# Patient Record
Sex: Male | Born: 1940 | Race: White | Hispanic: No | Marital: Married | Smoking: Former smoker
Health system: Southern US, Community
[De-identification: ages and names within clinical notes are randomized; demographics above are authoritative.]

## PROBLEM LIST (undated history)

## (undated) DIAGNOSIS — K429 Umbilical hernia without obstruction or gangrene: Secondary | ICD-10-CM

## (undated) DIAGNOSIS — C61 Malignant neoplasm of prostate: Secondary | ICD-10-CM

## (undated) DIAGNOSIS — N2889 Other specified disorders of kidney and ureter: Secondary | ICD-10-CM

## (undated) DIAGNOSIS — I2510 Atherosclerotic heart disease of native coronary artery without angina pectoris: Secondary | ICD-10-CM

## (undated) DIAGNOSIS — Q283 Other malformations of cerebral vessels: Secondary | ICD-10-CM

## (undated) DIAGNOSIS — I214 Non-ST elevation (NSTEMI) myocardial infarction: Secondary | ICD-10-CM

## (undated) DIAGNOSIS — E785 Hyperlipidemia, unspecified: Secondary | ICD-10-CM

## (undated) DIAGNOSIS — I219 Acute myocardial infarction, unspecified: Secondary | ICD-10-CM

## (undated) DIAGNOSIS — I10 Essential (primary) hypertension: Secondary | ICD-10-CM

## (undated) DIAGNOSIS — R011 Cardiac murmur, unspecified: Secondary | ICD-10-CM

## (undated) DIAGNOSIS — I25110 Atherosclerotic heart disease of native coronary artery with unstable angina pectoris: Secondary | ICD-10-CM

## (undated) DIAGNOSIS — I252 Old myocardial infarction: Secondary | ICD-10-CM

## (undated) DIAGNOSIS — I493 Ventricular premature depolarization: Secondary | ICD-10-CM

## (undated) DIAGNOSIS — M1990 Unspecified osteoarthritis, unspecified site: Secondary | ICD-10-CM

## (undated) DIAGNOSIS — K219 Gastro-esophageal reflux disease without esophagitis: Secondary | ICD-10-CM

## (undated) DIAGNOSIS — R51 Headache: Secondary | ICD-10-CM

## (undated) HISTORY — PX: POLYPECTOMY: SHX149

## (undated) HISTORY — DX: Gastro-esophageal reflux disease without esophagitis: K21.9

## (undated) HISTORY — DX: Non-ST elevation (NSTEMI) myocardial infarction: I21.4

## (undated) HISTORY — DX: Essential (primary) hypertension: I10

## (undated) HISTORY — PX: PROSTATE SURGERY: SHX751

## (undated) HISTORY — DX: Cardiac murmur, unspecified: R01.1

## (undated) HISTORY — PX: JOINT REPLACEMENT: SHX530

## (undated) HISTORY — DX: Atherosclerotic heart disease of native coronary artery with unstable angina pectoris: I25.110

## (undated) HISTORY — DX: Old myocardial infarction: I25.2

## (undated) HISTORY — DX: Acute myocardial infarction, unspecified: I21.9

---

## 2001-01-22 HISTORY — PX: TOTAL HIP ARTHROPLASTY: SHX124

## 2001-01-23 ENCOUNTER — Encounter: Payer: Self-pay | Admitting: Orthopedic Surgery

## 2001-01-29 ENCOUNTER — Encounter: Payer: Self-pay | Admitting: Orthopedic Surgery

## 2001-01-29 ENCOUNTER — Inpatient Hospital Stay (HOSPITAL_COMMUNITY): Admission: RE | Admit: 2001-01-29 | Discharge: 2001-02-01 | Payer: Self-pay | Admitting: Orthopedic Surgery

## 2003-11-23 ENCOUNTER — Ambulatory Visit: Admission: RE | Admit: 2003-11-23 | Discharge: 2004-02-21 | Payer: Self-pay | Admitting: Radiation Oncology

## 2004-03-01 ENCOUNTER — Ambulatory Visit: Admission: RE | Admit: 2004-03-01 | Discharge: 2004-04-14 | Payer: Self-pay | Admitting: Radiation Oncology

## 2004-03-15 ENCOUNTER — Ambulatory Visit (HOSPITAL_BASED_OUTPATIENT_CLINIC_OR_DEPARTMENT_OTHER): Admission: RE | Admit: 2004-03-15 | Discharge: 2004-03-15 | Payer: Self-pay | Admitting: Dermatology

## 2004-03-15 ENCOUNTER — Ambulatory Visit (HOSPITAL_COMMUNITY): Admission: RE | Admit: 2004-03-15 | Discharge: 2004-03-15 | Payer: Self-pay | Admitting: Dermatology

## 2004-03-15 HISTORY — PX: OTHER SURGICAL HISTORY: SHX169

## 2007-04-14 DIAGNOSIS — I219 Acute myocardial infarction, unspecified: Secondary | ICD-10-CM | POA: Insufficient documentation

## 2007-04-14 HISTORY — DX: Acute myocardial infarction, unspecified: I21.9

## 2007-04-14 HISTORY — PX: CORONARY ANGIOPLASTY WITH STENT PLACEMENT: SHX49

## 2008-03-30 ENCOUNTER — Ambulatory Visit (HOSPITAL_COMMUNITY): Admission: RE | Admit: 2008-03-30 | Discharge: 2008-03-31 | Payer: Self-pay | Admitting: Orthopedic Surgery

## 2008-03-30 HISTORY — PX: SHOULDER OPEN ROTATOR CUFF REPAIR: SHX2407

## 2008-07-17 ENCOUNTER — Emergency Department (HOSPITAL_COMMUNITY): Admission: EM | Admit: 2008-07-17 | Discharge: 2008-07-17 | Payer: Self-pay | Admitting: Emergency Medicine

## 2008-07-22 HISTORY — PX: TOTAL HIP REVISION: SHX763

## 2008-08-10 ENCOUNTER — Inpatient Hospital Stay (HOSPITAL_COMMUNITY): Admission: RE | Admit: 2008-08-10 | Discharge: 2008-08-12 | Payer: Self-pay | Admitting: Orthopedic Surgery

## 2010-04-30 LAB — BASIC METABOLIC PANEL
BUN: 9 mg/dL (ref 6–23)
CO2: 29 mEq/L (ref 19–32)
Calcium: 8.2 mg/dL — ABNORMAL LOW (ref 8.4–10.5)
Calcium: 8.3 mg/dL — ABNORMAL LOW (ref 8.4–10.5)
Creatinine, Ser: 0.96 mg/dL (ref 0.4–1.5)
GFR calc Af Amer: >60 mL/min (ref >60)
GFR calc non Af Amer: >60 mL/min (ref >60)
Glucose, Bld: 101 mg/dL — ABNORMAL HIGH (ref 70–99)
Potassium: 3.9 mEq/L (ref 3.5–5.1)
Sodium: 136 mEq/L (ref 135–145)
Sodium: 140 mEq/L (ref 135–145)

## 2010-04-30 LAB — PROTIME-INR
INR: 1.1 (ref 0.00–1.49)
INR: 1.1 (ref 0.00–1.49)
INR: 1.2 (ref 0.00–1.49)
Prothrombin Time: 14.3 seconds (ref 11.6–15.2)
Prothrombin Time: 16 seconds — ABNORMAL HIGH (ref 11.6–15.2)

## 2010-04-30 LAB — URINALYSIS, ROUTINE W REFLEX MICROSCOPIC
Bilirubin Urine: NEGATIVE
Glucose, UA: NEGATIVE mg/dL
Hgb urine dipstick: NEGATIVE
Ketones, ur: NEGATIVE mg/dL
Protein, ur: NEGATIVE mg/dL
pH: 5.5 (ref 5.0–8.0)

## 2010-04-30 LAB — CBC
HCT: 37.6 % — ABNORMAL LOW (ref 39.0–52.0)
HCT: 45.1 % (ref 39.0–52.0)
Hemoglobin: 12.2 g/dL — ABNORMAL LOW (ref 13.0–17.0)
Hemoglobin: 13 g/dL (ref 13.0–17.0)
Hemoglobin: 15.4 g/dL (ref 13.0–17.0)
MCHC: 34.1 g/dL (ref 30.0–36.0)
MCV: 100.6 fL — ABNORMAL HIGH (ref 78.0–100.0)
Platelets: 183 10*3/uL (ref 150–400)
Platelets: 233 10*3/uL (ref 150–400)
RBC: 3.57 MIL/uL — ABNORMAL LOW (ref 4.22–5.81)
RDW: 11.9 % (ref 11.5–15.5)
RDW: 12.2 % (ref 11.5–15.5)
WBC: 8.2 10*3/uL (ref 4.0–10.5)
WBC: 8.5 10*3/uL (ref 4.0–10.5)

## 2010-04-30 LAB — COMPREHENSIVE METABOLIC PANEL
Albumin: 3.6 g/dL (ref 3.5–5.2)
Alkaline Phosphatase: 67 U/L (ref 39–117)
BUN: 11 mg/dL (ref 6–23)
Creatinine, Ser: 0.9 mg/dL (ref 0.4–1.5)
Glucose, Bld: 91 mg/dL (ref 70–99)
Total Protein: 6.8 g/dL (ref 6.0–8.3)

## 2010-04-30 LAB — TYPE AND SCREEN: ABO/RH(D): O POS

## 2010-04-30 LAB — APTT: aPTT: 30 seconds (ref 24–37)

## 2010-05-04 LAB — COMPREHENSIVE METABOLIC PANEL
AST: 29 U/L (ref 0–37)
Albumin: 4 g/dL (ref 3.5–5.2)
Alkaline Phosphatase: 53 U/L (ref 39–117)
BUN: 14 mg/dL (ref 6–23)
CO2: 25 mEq/L (ref 19–32)
Chloride: 106 mEq/L (ref 96–112)
GFR calc non Af Amer: >60 mL/min (ref >60)
Potassium: 3.7 mEq/L (ref 3.5–5.1)
Total Bilirubin: 0.7 mg/dL (ref 0.3–1.2)

## 2010-05-04 LAB — URINALYSIS, ROUTINE W REFLEX MICROSCOPIC
Bilirubin Urine: NEGATIVE
Glucose, UA: NEGATIVE mg/dL
Hgb urine dipstick: NEGATIVE
Specific Gravity, Urine: 1.024 (ref 1.005–1.030)
Urobilinogen, UA: 1 mg/dL (ref 0.0–1.0)
pH: 6.5 (ref 5.0–8.0)

## 2010-05-04 LAB — CBC
HCT: 48.4 % (ref 39.0–52.0)
Hemoglobin: 16.4 g/dL (ref 13.0–17.0)
Platelets: 177 10*3/uL (ref 150–400)
RBC: 4.83 MIL/uL (ref 4.22–5.81)
WBC: 6.8 10*3/uL (ref 4.0–10.5)

## 2010-05-04 LAB — URINE CULTURE
Colony Count: NO GROWTH
Culture: NO GROWTH
Special Requests: NEGATIVE

## 2010-05-04 LAB — DIFFERENTIAL
Basophils Absolute: 0 10*3/uL (ref 0.0–0.1)
Basophils Relative: 1 % (ref 0–1)
Eosinophils Relative: 2 % (ref 0–5)
Monocytes Absolute: 0.7 10*3/uL (ref 0.1–1.0)
Neutro Abs: 3.6 10*3/uL (ref 1.7–7.7)

## 2010-05-04 LAB — ABO/RH: ABO/RH(D): O POS

## 2010-05-30 HISTORY — PX: REPLACEMENT TOTAL KNEE: SUR1224

## 2010-05-30 HISTORY — PX: HERNIA REPAIR: SHX51

## 2010-06-06 NOTE — Op Note (Signed)
NAME:  [name] [name], [name] [name] NO.:  [id number]   MEDICAL RECORD NO.:  [id number]          PATIENT TYPE:  AMB   LOCATION:  DAY                          FACILITY:  [facility]   PHYSICIAN:  Ronald A. Gioffre, M.D.DATE OF BIRTH:  11/26/1940   DATE OF PROCEDURE:  DATE OF DISCHARGE:                               OPERATIVE REPORT   PREOPERATIVE DIAGNOSES:  1. Complete retracted tear, complex type, of the left rotator cuff      tendon.  2. Severe impingement syndrome, left shoulder.   POSTOPERATIVE DIAGNOSES:  1. Complete retracted tear, complex type, of the left rotator cuff      tendon.  2. Severe impingement syndrome left shoulder.   SURGEON:  Georges Lynch. Darrelyn Hillock, M.D.   ASSISTANT:  Marlowe Kays, M.D.   OPERATION:  1. Partial acromionectomy and acromioplasty.  2. Repair of a complete complex retracted tear of the left rotator      cuff tendon.  3. A TissueMend graft to the left rotator cuff tendon utilizing two      PEEK anchors for the repair.   PROCEDURE:  Under general anesthesia, a routine orthopedic prep and  draping of the left shoulder was carried out.  He had 1 g of IV Ancef.  Prior to surgery, he had an interscalene nerve block on the left.  Following that, after the incision was made, bleeders identified and  cauterized.  I separated the deltoid tendon and muscle from the acromion  by sharp dissection.  I identified the acromion and protected the  underlying cuff with a Bennett retractor and did a partial  acromionectomy with the oscillating saw, then used a bur to bur the  undersurface of the acromion.  I thoroughly irrigated out the area.  I  went down and did a bursectomy.  The bursa was chronically inflamed.  I  removed the bursa.  I then went down and identified a tear.  I retracted  a tear both medial and laterally.  I went and utilized a bur to bur the  lateral articular surface of the humerus.  Following that, I did a  primary repair of the tendon,  but in regards to its longitudinal tear  torn portion I then inserted 2 PEEK anchors and brought the sutures  through the tendon distally and reattached at the proximal humerus.  Once this was done, I then reinforced the repair with a  TissueMend graft with a second anchor supporting the graft.  I  thoroughly irrigated out the area and then reattached the deltoid tendon  to the acromion in the usual fashion, and the muscle was closed with 0  Vicryl, the skin with metal staples.  A sterile Neosporin dressing was  applied and he was placed in a shoulder immobilizer.           ______________________________  Georges Lynch Darrelyn Hillock, M.D.     RAG/MEDQ  D:  03/30/2008  T:  03/31/2008  Job:  [id number]   cc:   Norman Herrlich, M.D.  Fax [phone]

## 2010-06-06 NOTE — Discharge Summary (Signed)
[name], [name] NO.:  [id number]   MEDICAL RECORD NO.:  [id number]          PATIENT TYPE:  INP   LOCATION:  1611                         FACILITY:  [facility]   PHYSICIAN:  [name] [name], M.D.    DATE OF BIRTH:  05/23/1940   DATE OF ADMISSION:  08/10/2008  DATE OF DISCHARGE:  08/12/2008                               DISCHARGE SUMMARY   ADMITTING DIAGNOSES:  1. Failed right total hip arthroplasty.  2. Hypertension.  3. Coronary arterial disease.  4. History myocardial infarction March 2009.  5. History of cardiac catheterization with sitting.  6. History of prostate cancer, status post seeding   DISCHARGE DIAGNOSES:  1. 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  2. Hypertension.  3. Coronary arterial disease.  4. History myocardial infarction March 2009.  5. History of cardiac catheterization with sitting.  6. History of prostate cancer, status post seeding   PROCEDURE:  On 08/10/2008 right acetabular revision.  Surgeon, Dr.  Lequita [name].  Assistant, [name] Peace PA-C.  Anesthesia general.   CONSULTATIONS:  None.   BRIEF HISTORY:  [name] [name] is a 70 year old male with a right total hip  arthroplasty, had no problems until about 3-4 weeks ago when he was  coming down stairs, felt a pop in his hip, had pain.  Radiographs showed  eccentric position of the femoral head in the acetabular shell.  Felt  like he broke his liner or dislodged liner with trauma.  He was admitted  for revision.   LABORATORY DATA:  Preop CBC showed hemoglobin 15.4, hematocrit 45.1,  white cell count 8.3, platelets 233.  Chem panel on admission all within  normal limits.  PT/INR 14.3 and 1.1, PTT of 30.  Preop UA negative.  Serial CBCs were followed.  Hemoglobin dropped down to 13, last H&H 12.2  and 35.8.  Serial protimes followed per Coumadin protocol.  PT/INR 16.0,  1.2.  Follow-up B mets x2 all within normal limits.   X-rays right hip  films August 06, 2008.  Findings consistent with wear of  the liner acetabular cup right total hip, no fracture or acute findings,  lumbar degenerative disk disease, scoliosis.  Two-view chest March 25, 2008, no acute cardiopulmonary problems.  EKG March 09, 2008, normal  sinus rhythm early precordial transition, confirmed [name] [name].   HOSPITAL COURSE:  The patient was admitted to [facility],  taken to OR, underwent above-stated procedure without complication.  The  patient tolerated seizure well, later transferred to the orthopedic  floor, started on PCA and p.o. analgesic pain control following surgery,  given 24 hours postop IV antibiotics.  He was doing very well on the  morning of day #1.  Hemovac drain was pulled.  Hemoglobin looked good.  KVO'd his fluids.  Progressed very well with his physical therapy.  By  day #2, he was doing very well with his therapies, weightbearing as  tolerated, just having a liner and head exchange.  Felt he would be safe  to go home, progressed with therapy  and was discharged home on day #2.  Hemoglobin was stable.  He did have history of coronary stenting and was  on Plavix and aspirin preoperatively.  We placed him on Coumadin for 2  weeks for DVT prophylaxis.  We did resume his baby aspirin during that  time.  We told him once he goes off the Coumadin, he is to resume his  Plavix.  He will follow up in 2 weeks.   DISCHARGE/PLAN:  1  Patient discharged home on August 12, 2008.  1. Discharge diagnoses please see above.  2. Discharge medications:  Coumadin, Lovenox, Robaxin, Percocet.  May      resume his baby aspirin.  He will be a Lovenox for two more days at      home.   DISCHARGE INSTRUCTIONS:  Activities:  Weightbearing as tolerated total  hip protocol.  Home health PT home health nursing.  Follow-up 2 weeks.   DISPOSITION:  Home.   CONDITION ON DISCHARGE:  Improved.      [name] [name], P.A.C.      [name] [name], M.D.  Electronically Signed    ALP/MEDQ  D:  08/12/2008  T:  08/12/2008  Job:  [id number]   cc:   [facility] [name] [name]

## 2010-06-06 NOTE — Op Note (Signed)
NAME:  [name] [name] NO.:  [id number]   MEDICAL RECORD NO.:  [id number]          PATIENT TYPE:  INP   LOCATION:  1611                         FACILITY:  [facility]   PHYSICIAN:  Ollen Gross, M.D.    DATE OF BIRTH:  1940-07-28   DATE OF PROCEDURE:  08/10/2008  DATE OF DISCHARGE:                               OPERATIVE REPORT   PREOPERATIVE DIAGNOSIS:  Failed acetabular component of right total hip  arthroplasty.   POSTOPERATIVE DIAGNOSIS:  Failed acetabular component of right total hip  arthroplasty.   PROCEDURE:  Right acetabular revision.   SURGEON:  Ollen Gross, M.D.   ASSISTANT:  Avel Peace, P.A.-C.   ANESTHESIA:  General.   ESTIMATED BLOOD LOSS:  200.   DRAINS:  Hemovac x1.   COMPLICATIONS:  None.   CONDITION:  Stable to recovery.   CLINICAL NOTE:  [name] is a 70 year old male who had a right total  hip arthroplasty done approximately 7 years ago.  He did fine with no  problems at all until about 3-4 weeks ago when he twisted coming down  some stairs and had a pop in the hip.  He had pain, and then radiographs  showed eccentric position of the femoral head and the acetabular shell.  I felt that he either broke his liner or dislodged his liner with that  trauma.  He has been comfortable and able to bear weight.  We decided to  electively take him to surgery at this point to explore the hip and  treat the failed acetabular component.   PROCEDURE IN DETAIL:  After successful administration of general  anesthetic, the patient was placed in left lateral decubitus position  with the right side up and held with the hip positioner.  His right  lower extremity was isolated from his perineum with plastic drapes and  prepped and draped in a usual sterile fashion.  A short posterolateral  incision was made with a 10 blade through subcutaneous tissue to the  level of fascia lata which was incised in line with the skin incision.  The sciatic nerve was  palpated and protected.  The posterior  pseudocapsule was removed off the posterior femur with electrocautery.  The hip joint identified, and there is minimal fluid present.  There was  some metallic-stained tissue which was excised back to normal looking  tissue.  The femoral component is stable.  The femoral head is removed.  The liner had dislodged and was sitting posterior inferior in the  acetabular shell.  The head was still articulating with the liner but in  the abnormal position.  I removed the femoral head, and there was a lot  of burnishing on the posterior side consistent with it also hitting up  against the metal.  Fortunately, the acetabular shell did not show any  corresponding burnishing.  I cleaned all of the soft tissue from around  the edge of the shell, and the shell was in great position and was well  fixed.  The femoral components also inspected and in excellent position  with no positional tissues.  I then retracted the femur anteriorly to  get full acetabular exposure.  I decided that at his young age and high  activity level that we were going to convert him to a metal-on-metal  composite.  I felt it was safe to do this as the debris from the femoral  head was completely removed, and I did not see any more metal-stained  tissue.  I thus placed the 60-mm outer diameter, 40-mm inner diameter  metal insert into the 60-mm acetabular component.  This locked in, and I  tested it in all positions to make sure that it was indeed locked in,  and it was.  We then did a trial 40.0 head, and it reduced too easily.  We went to a 40 plus 6 which had appropriate soft-tissue tension.  There  was great stability with full extension and full external rotation, 70  degrees flexion, 40 degrees adduction, 90 degrees internal rotation, 90  degrees of flexion and 70 degrees of internal rotation.  By placing the  right leg on top of the left, it felt as though leg lengths were equal.  Hip  was then dislocated, and a 40 plus 6 head was placed on the femoral  neck.  It was reduced with the same stability parameters.  The wound was  copiously irrigated with saline solution, and posterior pseudocapsule  reattached to the femur through drill holes.  We had a very good repair  there.  The fascia lata was closed over a Hemovac drain with interrupted  #1 Vicryl, subcutaneous tissue closed with 2-0 Vicryl and subcuticular  running 4-0 Monocryl.  Incisions cleaned and dried, and Steri-Strips and  bulky sterile dressing applied.  The drain was hooked to suction, and he  was placed into a knee immobilizer, awakened and transported to recovery  in stable condition.      Ollen Gross, M.D.  Electronically Signed     FA/MEDQ  D:  08/10/2008  T:  08/11/2008  Job:  [id number]

## 2010-06-06 NOTE — H&P (Signed)
NAME:  [name] [name], [name] [name] NO.:  [id number]   MEDICAL RECORD NO.:  [id number]          PATIENT TYPE:  INP   LOCATION:  1611                         FACILITY:  [facility]   PHYSICIAN:  Ollen Gross, M.D.    DATE OF BIRTH:  1940/02/10   DATE OF ADMISSION:  08/10/2008  DATE OF DISCHARGE:                              HISTORY & PHYSICAL   CHIEF COMPLAINT:  Failed right total hip.   HISTORY OF PRESENT ILLNESS:  The patient is a 70 year old male who has  been seen by Dr. Lequita Halt for ongoing right hip pain.  He has about 7  years out from his total hip arthroplasty and has been doing great.  He  had not been seen in follow-up since 2004.  He was a year out at that  point.  He was recently seen by Dwyane Luo, PAC in the office, found to  have an abnormal x-ray.  Patient followed back up with Dr. Lequita Halt and x-  rays revealed an eccentric polyethylene wear and the femoral head is  almost totally out of the superior aspect of the acetabulum. The patient  states that he was walking down some steps, stepping out of his house  and had a right leg landed funny and had a twisting of the hip and  immediate pain.  It is felt that he has dislodged or disrupted the liner  of the hip, and it is felt he would require revision.  Risks and  benefits have been discussed.  He elected to proceed with surgery.  He  has been seen by Dr. Dulce Sellar and felt to be able to proceed with surgery.   ALLERGIES:  NO KNOWN DRUG ALLERGIES.   CURRENT MEDICATIONS:  1. Plavix 75 mg.  2. Baby aspirin.  3. Metoprolol.   PAST MEDICAL HISTORY:  1. Hypertension.  2. Coronary arterial disease.  3. History of myocardial infarction in March 2009.  4. He has undergone cardiac catheterization with stenting.  5. History of prostate cancer status post seeding.   PAST SURGICAL HISTORY:  1. Prostate seeding in February 2006.  2. He has undergone a right total hip replacement January 2003.  3. Cardiac catheterization with  one stents March 2009.  4. Rotator cuff repair March 2010.   FAMILY HISTORY:  Father with heart attack.  Mother with heart attack.   SOCIAL HISTORY:  Married, quit smoking about 45 years ago.  Occasional  seldom intake of beer.  Two children.  Wife will be assisting with care  after surgery.  He has one small riser step going into his home.  He  does have a healthcare power of attorney.   REVIEW OF SYSTEMS:  GENERAL:  No fevers, chills or night sweats.  Neurological:  No seizures, syncope or paralysis.  RESPIRATORY:  No  shortness of breath, productive cough or hemoptysis.  CARDIOVASCULAR:  No chest pain, angina, orthopnea.  GI:  No nausea, vomiting, diarrhea or  constipation.  GU:  Little bit of weak stream, no dysuria, hematuria.  MUSCULOSKELETAL: Right hip.   PHYSICAL EXAMINATION:  VITAL SIGNS:  Pulse  78, respirations 12, blood  pressure 132/72.  GENERAL:  A 70 year old white male, well-nourished, well-developed, no  acute distress.  He is alert, oriented and cooperative.  Good historian.  Accompanied by his wife.  HEENT:  Normocephalic, atraumatic.  Pupils round and reactive.  EOMs  intact.  NECK:  Supple.  CHEST:  Clear.  HEART:  Regular rate and rhythm.  No murmur, S1-S10.  ABDOMEN:  Soft, nontender.  Bowel sounds present.  RECTAL/BREASTS/GENITALIA:  Not done, not pertinent to present illness.  EXTREMITIES:  Right hip:  Hip flexion shows 100 degrees, internal  rotation 20, external rotation 30, abduction 30, does ambulate with  antalgic gait.   IMPRESSION:  Failed right total hip arthroplasty.   PLAN:  The patient was admitted to [facility] to undergo  acetabular lateral revision of the right hip.  Surgery will be performed  by Ollen Gross.      Alexzandrew L. Perkins, P.A.C.      Ollen Gross, M.D.  Electronically Signed    ALP/MEDQ  D:  08/10/2008  T:  08/11/2008  Job:  [id number]   cc:   [facility] Dr. Norman Herrlich

## 2010-06-09 NOTE — Op Note (Signed)
[facility]  Patient:    JURRELL, ROYSTER Visit Number: [id number] MRN: [id number]          Service Type: SUR Location: 4W [id number] Attending Physician:  Loanne Drilling Dictated by:   Ollen Gross, M.D. Proc. Date: 01/29/01 Admit Date:  01/29/2001                             Operative Report  PREOPERATIVE DIAGNOSIS:  Osteoarthritis, right hip.  POSTOPERATIVE DIAGNOSIS:  Osteoarthritis, right hip.  PROCEDURE:  Right total hip arthroplasty.  SURGEON:  Ollen Gross, M.D.  ASSISTANT:  Dorie Rank, P.A.  ANESTHESIA:  Spinal.  ESTIMATED BLOOD LOSS:  700  DRAINS:  Hemovac x1.  COMPLICATIONS:  None.  CONDITION:  Stable to recovery.  BRIEF CLINICAL NOTE:  Mr. Radle is a 70 year old male with severe osteoarthritis of the right hip with pain refractory to nonoperative management. He presents now for a total hip arthroplasty.  DESCRIPTION OF PROCEDURE:   After successful administration of spinal anesthetic, the patient was placed in the left lateral decubitus position with the right side up and held with a hip positioner. The right lower extremity was isolated from his perineum with plastic drapes, prepped and draped in the usual sterile fashion. Many posterolateral incisions utilized, skin cut with a 10 blade in the subcutaneous tissue above the fascia lata which was incised in line with the skin incision. Short external rotators were isolated off the femur after the sciatic nerve was palpated and protected. Capsulectomy was performed, hip dislocated and center of the femoral head marked. Trial prosthesis placed such that the center of the trial head corresponds with the center of the native femoral head. Osteotomy was made with an oscillating saw. The femur was retracted anteriorly and acetabular exposure obtained.  Acetabular reaming starts with a 53 coursing in increments of 2 to a 59 and then a 60 mm pinnacle acetabular shell is placed. He  had some protrusio thus I took bone graft from his femoral head and used that to build the medial portion of the acetabulum back to more anatomic position. The cup is placed in anatomic position and transfixed with two dome screws. A trial 36 mm neutral liner is placed.  The femur is repaired first with the canal finder and then with axial reaming up to 15.5 mm. Proximal reaming is up to a 20D and the sleeve is machined to a large. A 20D large sleeve placed, 20 x 15 stem, 36 plus 8 neck, a 36 plus zero head. The hip is reduced with outstanding stability, full extension, full external rotation, 70 degrees flexion, 40 degrees adduction, 90 degrees internal rotation, and 90 degrees flexion, 90 degrees internal rotation. Hip dislocated and all the trials removed. Permanent apex hole eliminator was placed at the acetabular shell and the permanent 36 mm neutral marathon liner placed in the 60 mm shell.  The permanent 20D large sleeve and the 20 x 15 stem with 36 plus 8 neck is placed. A 36 plus zero femoral head is placed, hip reduced with the same stability parameters. The wound is copiously irrigated with antibiotic solution and short external rotators reattached to the femur through drill holes. ______  and fascia of the gluteus maximus over a Hemovac drain with interrupted #1 Vicryl, subcu closed with interrupted #1 and interrupted 2-0 Vicryl, subcuticular running 4-0 monocryl. The incision was clean and dry and Steri-Strips and bulky sterile dressing applied.  Drains hooked to suction and the patient awakened and transported to recovery in stable condition. Dictated by:   Ollen Gross, M.D. Attending Physician:  Loanne Drilling DD:  01/29/01 TD:  01/29/01 Job: [id number]

## 2010-06-09 NOTE — Op Note (Signed)
NAME:  [name] [name], [name] [name]                 ACCOUNT NO.:  [id number]   MEDICAL RECORD NO.:  [id number]          PATIENT TYPE:  AMB   LOCATION:  [facility]                         FACILITY:  [facility]   PHYSICIAN:  Ronald L. Earlene Plater, M.D.  DATE OF BIRTH:  August 05, 1940   DATE OF PROCEDURE:  03/15/2004  DATE OF DISCHARGE:                                 OPERATIVE REPORT   DIAGNOSIS:  Adenocarcinoma of the prostate.   OPERATIVE PROCEDURE:  Transperineal implantation of iodine 125 seeds into  the prostate and flexible cystourethroscopy.   SURGEON:  Lucrezia Starch. Earlene Plater, M.D.   ASSISTANT:  Maryln Gottron, M.D.   ANESTHESIA:  General endotracheal.   ESTIMATED BLOOD LOSS:  10 cc.   COMPLICATIONS:  None.   INDICATIONS FOR PROCEDURE:  [name] [name] is a nice 70 year old white male who  was found to have adenocarcinoma of the prostate. He had a PSA of 4.07 and  underwent biopsies of the prostate which revealed a Gleason 6 adenocarcinoma  in 20% of the biopsy from the right side of his prostate. He has considered  options and elects to proceed with seed implantation. He had quite a large  prostate and underwent Lupron downsizing. He understands risks, benefits,  and alternatives, being properly stimulated, properly informed, and elected  to proceed.   PROCEDURE IN DETAIL:  The patient was placed in supine position. After  proper LMA anesthesia, was placed in the dorsal lithotomy position and  prepped and draped with Betadine in sterile fashion. A 16-French Foley  catheter was inserted in the urethra and inflated with 10 cc of contrast  solution. The transrectal ultrasound was placed in preplanned coordinates  along with the fluoroscopy, and a 16-grade retrograde catheter was placed in  the rectum to prevent flatus, and intraoperative dosimetry was calculated  and compared with preoperative dosimetry, and really it was felt that no  seeds need to be moved. We looked at both the urethral and rectal dosing and  were  well within parameters. Two holding needles were placed in preplanned  coordinates utilizing the physical and electronic grid and a reference plane  of 2 cm of the base was used for implantation. Serial implantation was  performed with iodine 125 seeds, the parameters of which will be in the  operative record as far as number of seeds, number of needles, and  millicuries per seed. Seeds appeared to distribute well throughout the  prostate, and we were comfortable with intraoperative dosimetry. Following  implantation, static images were obtained with and without fluoroscopy, and  we were comfortable with location, and on ultrasound, we were also  comfortable with the location. The transrectal ultrasound probe was removed  along with red rubber catheter. The patient was placed in supine position  after the wound was dressed sterilely, and the Foley catheter was removed  and scanned for seeds, and there were none within it. Flexible  cystourethroscopy was performed with an Olympus flexible cystoscope  urethroscope. He was noted to have moderate trilobar hypertrophy. Grade 1  trabeculation was noted in the bladder. Efflux of clear urine was noted  from the normally placed ureteral orifices bilaterally, and there were no  lesions or seeds or spacers in the bladder or in the urethra. A new 16-  French Foley catheter was passed into the bladder. Bladder was drained. The  patient was taken to the recovery room stable.      RLD/MEDQ  D:  03/15/2004  T:  03/15/2004  Job:  [id number]

## 2010-06-09 NOTE — Discharge Summary (Signed)
[facility]  Patient:    [name] [name], [name] [name] Visit Number: [id number] MRN: [id number]          Service Type: SUR Location: 4W [id number] [id number] Attending Physician:  Loanne Drilling Dictated by:   Ralene Bathe, P.A.-C Admit Date:  01/29/2001 Discharge Date: 02/01/2001                             Discharge Summary  ADMISSION DIAGNOSES: 1. End stage osteoarthritis of right hip. 2. Remote history of heart murmur. 3. Reflux. 4. History of tachycardia, treated with Toprol.  DISCHARGE DIAGNOSES: 1. End stage osteoarthritis of right hip. 2. Remote history of heart murmur. 3. Reflux. 4. History of tachycardia, treated with Toprol. 5. Status post right total hip arthroplasty.  OPERATION:  Right total hip arthroplasty.  SURGEON:  Ollen Gross, M.D.  ASSISTANT:  Dorie Rank, P.A.-C  ANESTHESIA:  Spinal.  HISTORY OF PRESENT ILLNESS:  Mr. Campillo is a 70 year old male with end stage osteoarthritis involving the right hip.  He has failed non-operative conservative treatments, and is having debilitating pain secondary to osteoarthritis.  At this time he wishes to proceed with operative management. Total hip replacement is indicated.  Risks, benefits discussed with the patient at length.  He is in agreement and wishes to proceed.  HOSPITAL COURSE:  The patient was admitted, and underwent the above named procedure and tolerated it well.  All appropriate IV antibiotics and analgesics were utilized.  Postoperatively, he was placed on total hip precautions and protocol with therapy, and on Erixstra trial for deep venous thrombosis and pulmonary embolus prophylaxis.  He was monitored per protocol. All-in-all the patient did extremely well postoperatively.  By postoperative day #3, he had met all therapy goals.  He was hemodynamically stable.  He was afebrile.  His incision was clean and dry.  At this time he was felt stable for discharge to home.  Home health physical  therapy and occupational therapy arranged.  LABORATORY DATA:  Hemoglobin 16.2, postoperatively ranged between 11.4 to 13.0 on date of discharge.  Prothrombin times and INR were normal on admission. Chemistries on admission were normal.  Postoperatively, shows a mild hypokalemia on 02/01/01, at 3.3, and glucose of 147.  Urinalysis normal on admission.  Blood type shows O+.  X-rays showed osteoarthritis of the right hip.  Chest film showed no acute cardiopulmonary process.  These were on 01/23/01.  Postoperatively, showed satisfactory right total hip arthroplasty. EKG not found at time of dictation.  CONDITION ON DISCHARGE:  Stable and improved.  DISPOSITION:  The patient is being discharged to home in the care of his family.  DISCHARGE MEDICATIONS: 1. He has Armed forces technical officer for home use, and has been given thorough instructions per    protocol. 2. Robaxin 500 mg one q.8h. p.r.n. spasm. 3. Percocet 5/325 mg one to two q.4-6h. p.r.n. pain. 4. Resume home medications.  ACTIVITY:  Hip precautions, and home health physical therapy.  WOUND CARE:  Keep dressing clean and dry.  May shower on 02/02/01.  DIET:  Resume home diet.  FOLLOWUP:  Two weeks from surgery.  Call for a time.  Call for any further questions. Dictated by:   Ralene Bathe, P.A.-C Attending Physician:  Loanne Drilling DD:  02/24/01 TD:  02/25/01 Job: [id number]

## 2010-06-09 NOTE — H&P (Signed)
[facility]  Patient:    [name] [name] Visit Number: [id number] MRN: [id number]          Service Type: Attending:  Ollen Gross, M.D. Dictated by:   Dorie Rank, P.A. Adm. Date:  01/29/01   CC:         Hayden Rasmussen, [facility] [address], [place], [place] [address]   History and Physical  DATE OF BIRTH:  04-Feb-1940.  CHIEF COMPLAINT:  Right hip pain.  HISTORY OF PRESENT ILLNESS:  Mr. [name] [name] is a pleasant 70 year old male with a six-month history of pain in his right hip and groin area.  He does not recall any specific injury leading to this.  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 He feels like this may have contributed to his hip problem.  This pain is definitely limiting his activities.  He has pain throughout most days and occasionally pain at night depending on how active he was on a specific day. His wife feels that it is definitely affecting his personality.  He is at the stage now where he is considering having something done about it.  He saw Dr. Krista Blue in [place], and he recommended hip replacement, and he wanted to come to our office for a second opinion.  Radiographs reviewed in our office revealed very significant marginal osteophyte formation and changes in the shape of the right femoral head on the right.  Left side shows minimal arthrosis.  On physical exam, the right hip showed flexion to 90 degrees, internal rotation only 10 degrees, external 30 degrees, abduction 30 degrees. He was noted to walk with an antalgic gait.  Pattern was an abductor lurch on the right knee.  Pulses were intact distally, and motor and sensation were normal to the right lower extremity.  It is felt due to his significant limitation of activities as well as diagnostic studies that it would benefit him to undergo a right total hip arthroplasty.  The risks, benefits, as  well as the procedure were discussed with the patient, and he wished to proceed. He obtained medical clearance from his primary care Xaivier Malay at [facility].  MEDICATIONS:  Naproxen one p.o. b.i.d., Prilosec one p.o. q.d., Toprol XL 50 mg one p.o. q.d.  ALLERGIES:  No known drug allergies.  PAST MEDICAL HISTORY:  History of a heart murmur in the distant past without sequelae  He has reflux.  He was noted to have tachycardia on recent EKG and was started on Toprol for this.  FAMILY HISTORY:  Mother deceased at age 64, history of cardiovascular disease and an MI.  Father deceased at age 76, history of myocardial infarct.  PAST SURGICAL HISTORY:  Inguinal herniorrhaphy x 30 years ago.  REVIEW OF SYSTEMS:  GENERAL:  No fevers, chills, night sweats, or bleeding tendencies.  PULMONARY:  No shortness of breath, productive cough, or hemoptysis.  CARDIOVASCULAR:  No chest pain, angina, or orthopnea.  ENDOCRINE: No history of diabetes mellitus.  No history of hypothyroidism or hyperthyroidism.  NEUROLOGIC:  No seizures, headaches, or paralysis or diplopia.  GASTROINTESTINAL:  No nausea, vomiting, diarrhea, constipation, or melena.  GENITOURINARY:  No hematuria, dysuria, or discharge. MUSCULOSKELETAL:  Right hip pain as described up above in the history of present illness.  PHYSICAL EXAMINATION:  VITAL SIGNS:  Pulse 76, respirations 18, blood pressure 130/85.  GENERAL:  Alert and  oriented, conversant 70 year old male accompanied by his wife today in the office.  HEENT:  Head is atraumatic, normocephalic.  Oropharynx is clear.  NECK:  Supple.  Negative for carotid bruits bilaterally.  CHEST:  Lungs are clear to auscultation bilaterally.  No wheezes, rhonchi, or rales.  BREASTS:  Not pertinent to present illness.  LYMPHATIC:  No cervical lymphadenopathy palpated on exam.  CARDIAC:  S1, S2, negative for murmur, rub, or gallop.  Heart is regular rate and  rhythm.  ABDOMEN:  Soft, nontender, positive bowel sounds in all four quadrants.  GENITOURINARY:  Not pertinent to present illness.  EXTREMITIES:  Please see history of present illness for full physical exam to the right hip.  Skin is intact.  There are no rashes or lesions appreciated on exam.  There is a 2+ dorsalis pedis pulse bilaterally.  There is a 1+ posterior tibialis pulse bilaterally.  DIAGNOSTIC STUDIES:  Preoperative labs and x-rays are pending at todays dictation.  IMPRESSION: 1. Osteoarthritis of the right hip. 2. Gastroesophageal reflux disease. 3. History of tachycardia, resolved today on Toprol.  PLAN:  The patient is scheduled for a right total hip arthroplasty with Dr. Ollen Gross. Dictated by:   Dorie Rank, P.A. Attending:  Ollen Gross, M.D. DD:  01/24/01 TD:  01/24/01 Job: [id number]

## 2012-07-29 ENCOUNTER — Ambulatory Visit (HOSPITAL_COMMUNITY)
Admission: RE | Admit: 2012-07-29 | Discharge: 2012-07-29 | Disposition: A | Discharge status: Home / Self Care | Payer: Medicare Other | Source: Ambulatory Visit | Attending: Urology | Admitting: Urology

## 2012-07-29 ENCOUNTER — Other Ambulatory Visit (HOSPITAL_COMMUNITY): Payer: Self-pay | Admitting: Urology

## 2012-07-29 DIAGNOSIS — D4959 Neoplasm of unspecified behavior of other genitourinary organ: Secondary | ICD-10-CM | POA: Insufficient documentation

## 2012-07-29 DIAGNOSIS — I10 Essential (primary) hypertension: Secondary | ICD-10-CM | POA: Insufficient documentation

## 2012-07-29 MED ORDER — KETOROLAC TROMETHAMINE 30 MG/ML IJ SOLN
INTRAMUSCULAR | Status: AC
  Filled 2012-07-29: qty 1

## 2012-08-04 ENCOUNTER — Encounter (INDEPENDENT_AMBULATORY_CARE_PROVIDER_SITE_OTHER): Payer: Self-pay

## 2012-08-07 ENCOUNTER — Encounter (INDEPENDENT_AMBULATORY_CARE_PROVIDER_SITE_OTHER): Payer: Self-pay | Admitting: Surgery

## 2012-08-07 ENCOUNTER — Ambulatory Visit (INDEPENDENT_AMBULATORY_CARE_PROVIDER_SITE_OTHER): Payer: Medicare Other | Admitting: Surgery

## 2012-08-07 VITALS — BP 162/78 | HR 68 | Resp 16 | Ht 75.0 in | Wt 208.6 lb

## 2012-08-07 DIAGNOSIS — K8020 Calculus of gallbladder without cholecystitis without obstruction: Secondary | ICD-10-CM | POA: Insufficient documentation

## 2012-08-07 HISTORY — DX: Calculus of gallbladder without cholecystitis without obstruction: K80.20

## 2012-08-07 NOTE — Patient Instructions (Signed)
We will coordinate with your urologist to try to schedule surgery to remove yourgallbladder at the same time he is removing the growth in your kidney

## 2012-08-07 NOTE — Progress Notes (Signed)
Patient ID: [name] [name], male   DOB: 04/22/1940, 72 y.o.   MRN: [id number]  Chief Complaint  Patient presents with  . Cholelithiasis    eval gallstones    HPI [name] [name] is a 72 y.o. male.  He is going to be having laparoscopic partial nephrectomy and was recently found to have gallstones after an attack of biliary colic which he took him to the emergency room. That episode started as mild indigestion and progressed to significant epigastric and right upper quadrant pain associated with nausea. It eventually responded to some pain medication given in the emergency department. An ultrasound is reported to have shown gallstones and we cannot get the report or open the CD with the file on an as yet. He has not had any further episodes. He has been avoiding fatty foods since the one episode. He is raising a cardiologist and received cardiology clearance for surgery.  HPI  Past Medical History  Diagnosis Date  . Cancer     prostate  . Hypertension   . Myocardial infarction     Past Surgical History  Procedure Laterality Date  . Replacement total hip w/  resurfacing implants    . Prostate surgery    . Coronary artery bypass graft    . Replacement total knee    . Rotator cuff repair      Family History  Problem Relation Age of Onset  . Heart attack Mother     Social History History  Substance Use Topics  . Smoking status: Never Smoker   . Smokeless tobacco: Never Used  . Alcohol Use: No    No Known Allergies  Current Outpatient Prescriptions  Medication Sig Dispense Refill  . amLODipine (NORVASC) 5 MG tablet       . aspirin 81 MG tablet Take 81 mg by mouth daily.      . carvedilol (COREG) 25 MG tablet Take 25 mg by mouth 2 (two) times daily with a meal.      . clopidogrel (PLAVIX) 75 MG tablet Take 75 mg by mouth daily.      . divalproex (DEPAKOTE ER) 250 MG 24 hr tablet       . losartan (COZAAR) 100 MG tablet        No current facility-administered medications for  this visit.    Review of Systems Review of Systems  Constitutional: Negative for fever, chills and unexpected weight change.  HENT: Negative for hearing loss, congestion, sore throat, trouble swallowing and voice change.   Eyes: Negative for visual disturbance.  Respiratory: Negative for cough and wheezing.   Cardiovascular: Negative for chest pain, palpitations and leg swelling.  Gastrointestinal: Negative for nausea, vomiting, abdominal pain, diarrhea, constipation, blood in stool, abdominal distention, anal bleeding and rectal pain.  Genitourinary: Negative for hematuria and difficulty urinating.  Musculoskeletal: Negative for arthralgias.  Skin: Negative for rash and wound.  Neurological: Negative for seizures, syncope, weakness and headaches.  Hematological: Negative for adenopathy. Does not bruise/bleed easily.  Psychiatric/Behavioral: Negative for confusion.    Blood pressure 162/78, pulse 68, resp. rate 16, height 6\' 3"  (1.905 m), weight 208 lb 9.6 oz (94.62 kg).  Physical Exam Physical Exam  Vitals reviewed. Constitutional: He is oriented to person, place, and time. He appears well-developed and well-nourished. No distress.  HENT:  Head: Normocephalic and atraumatic.  Eyes: Conjunctivae and EOM are normal. Pupils are equal, round, and reactive to light. Left eye exhibits no discharge.  Neck: Normal range of motion. Neck  supple. No tracheal deviation present. No thyromegaly present.  Cardiovascular: Normal rate, regular rhythm, normal heart sounds and intact distal pulses.  Exam reveals no gallop and no friction rub.   No murmur heard. Pulmonary/Chest: Effort normal and breath sounds normal. No respiratory distress. He has no wheezes. He has no rales.  Mild increased AP diameter, breath sounds somewhat distant.  Abdominal: Soft. Bowel sounds are normal. He exhibits no distension and no mass. There is no tenderness. There is no rebound and no guarding.  Musculoskeletal:  Normal range of motion. He exhibits no edema.  Neurological: He is alert and oriented to person, place, and time.  Skin: Skin is warm and dry. No erythema.  Psychiatric: He has a normal mood and affect. His behavior is normal. Judgment and thought content normal.    Data Reviewed I have received a CD with some information but have not yet been able to open it.  Assessment    Gallstones with one episode of biliary colic; mass right kidney scheduled for laparoscopic excision     Plan    I recommended laparoscopic cholecystectomy be done at the same time he has kidney surgery.. I have discussed the indications for laparoscopic cholecystectomy with him and provided educational material. We have discussed the risks of surgery, including general risks such as bleeding, infection, lung and heart issues etc. We have also discussed the potential for injuries to other organs, bile duct leaks, and other unexpected events. We have also talked about the fact that this may need to be converted to open under certain circumstances. We discussed the typical post op recovery and the fact that there is a good likelihood of improvement in symptoms and return to normal activity.  He understands this and wishes to proceed to schedule surgery. I believe all of his questions have been answered.           Debbrah Sampedro J 08/07/2012, 3:11 PM

## 2012-08-19 ENCOUNTER — Telehealth (INDEPENDENT_AMBULATORY_CARE_PROVIDER_SITE_OTHER): Payer: Self-pay | Admitting: General Surgery

## 2012-08-19 ENCOUNTER — Other Ambulatory Visit: Payer: Self-pay | Admitting: Urology

## 2012-08-19 NOTE — Telephone Encounter (Signed)
This is a patient of Dr. Jamey Ripa that needs robotic surgery with Dr Margarita Grizzle. They would like to get it on soon.There robotic room is available 8/29 at [facility]. Would you do the coordination with Dr Margarita Grizzle for Dr Jamey Ripa as he will be out.  They want you to go first. Its a gall bladder.  We can bring pt into office for OV if needed Thank

## 2012-08-20 NOTE — Telephone Encounter (Signed)
Appointment made for 8/12 at 11:20 for office visit.  Florentina Addison will notify patient.

## 2012-08-20 NOTE — Telephone Encounter (Signed)
Yes, I can do that.  Just have him come to the office for a short visit so that i can meet him prior to surgery.  AT

## 2012-09-02 ENCOUNTER — Ambulatory Visit (INDEPENDENT_AMBULATORY_CARE_PROVIDER_SITE_OTHER): Payer: Medicare Other | Admitting: General Surgery

## 2012-09-02 ENCOUNTER — Encounter (INDEPENDENT_AMBULATORY_CARE_PROVIDER_SITE_OTHER): Payer: Self-pay | Admitting: General Surgery

## 2012-09-02 VITALS — BP 132/84 | HR 76 | Resp 14 | Ht 75.5 in | Wt 206.6 lb

## 2012-09-02 DIAGNOSIS — K8021 Calculus of gallbladder without cholecystitis with obstruction: Secondary | ICD-10-CM

## 2012-09-02 DIAGNOSIS — K8050 Calculus of bile duct without cholangitis or cholecystitis without obstruction: Secondary | ICD-10-CM

## 2012-09-02 DIAGNOSIS — K8020 Calculus of gallbladder without cholecystitis without obstruction: Secondary | ICD-10-CM

## 2012-09-02 NOTE — Progress Notes (Signed)
Chief Complaint   Patient presents with   .  Cholelithiasis     eval gallstones   HPI  [name] [name] is a 72 y.o. male. He is going to be having robotic partial nephrectomy and was recently found to have gallstones after an attack of biliary colic which he took him to the emergency room. That episode started as mild indigestion and progressed to significant epigastric and right upper quadrant pain associated with nausea. It eventually responded to some pain medication given in the emergency department. An ultrasound showed gallstones, mildly thickened wall and a normal cbd diameter. He has not had any further episodes. He has been avoiding fatty foods since the one episode.   HPI  Past Medical History   Diagnosis  Date   .  Cancer      prostate   .  Hypertension    .  Myocardial infarction     Past Surgical History   Procedure  Laterality  Date   .  Replacement total hip w/ resurfacing implants     .  Prostate surgery     .  Coronary artery bypass graft     .  Replacement total knee     .  Rotator cuff repair      Family History   Problem  Relation  Age of Onset   .  Heart attack  Mother    Social History  History   Substance Use Topics   .  Smoking status:  Never Smoker   .  Smokeless tobacco:  Never Used   .  Alcohol Use:  No   No Known Allergies  Current Outpatient Prescriptions   Medication  Sig  Dispense  Refill   .  amLODipine (NORVASC) 5 MG tablet      .  aspirin 81 MG tablet  Take 81 mg by mouth daily.     .  carvedilol (COREG) 25 MG tablet  Take 25 mg by mouth 2 (two) times daily with a meal.     .  clopidogrel (PLAVIX) 75 MG tablet  Take 75 mg by mouth daily.     .  divalproex (DEPAKOTE ER) 250 MG 24 hr tablet      .  losartan (COZAAR) 100 MG tablet       No current facility-administered medications for this visit.   Review of Systems  Review of Systems  Constitutional: Negative for fever, chills and unexpected weight change.  HENT: Negative for hearing loss,  congestion, sore throat, trouble swallowing and voice change.  Eyes: Negative for visual disturbance.  Respiratory: Negative for cough and wheezing.  Cardiovascular: Negative for chest pain, palpitations and leg swelling.  Gastrointestinal: Negative for nausea, vomiting, abdominal pain, diarrhea, constipation, blood in stool, abdominal distention, anal bleeding and rectal pain.  Genitourinary: Negative for hematuria and difficulty urinating.  Musculoskeletal: Negative for arthralgias.  Skin: Negative for rash and wound.  Neurological: Negative for seizures, syncope, weakness and headaches.  Hematological: Negative for adenopathy. Does not bruise/bleed easily.  Psychiatric/Behavioral: Negative for confusion.    Physical Exam  Physical Exam  Vitals reviewed.  Filed Vitals:   09/02/12 1129  BP: 132/84  Pulse: 76  Resp: 14   Constitutional: He is oriented to person, place, and time. He appears well-developed and well-nourished. No distress.  HENT:  Head: Normocephalic and atraumatic.  Eyes: Conjunctivae and EOM are normal. Pupils are equal, round, and reactive to light. Left eye exhibits no discharge.  Neck: Normal range of   motion. Neck supple. No tracheal deviation present. No thyromegaly present.  Cardiovascular: Normal rate, regular rhythm, normal heart sounds  Pulmonary/Chest: Effort normal and breath sounds normal. No respiratory distress. He has no wheezes. He has no rales.  Abdominal: Soft. Bowel sounds are normal. He exhibits no distension and no mass. There is no tenderness. There is no rebound and no guarding. Umbilical hernia Musculoskeletal: Normal range of motion. He exhibits no edema.  Neurological: He is alert and oriented to person, place, and time.  Skin: Skin is warm and dry. No erythema.  Psychiatric: He has a normal mood and affect. His behavior is normal. Judgment and thought content normal.  Data Reviewed  RUQ US report reviewed.  Mildly thickened gb wall,  gallstones, normal cbd diameter LFTs WNL Assessment  Gallstones with one episode of biliary colic; mass right kidney scheduled for robotic excision  Plan  I recommended robotic cholecystectomy be done at the same time he has kidney surgery.  We will also repair his umbilical hernia.  I have discussed the indications for cholecystectomy with him  and provided educational material. We have discussed the risks of surgery, including general risks such as bleeding, infection, lung and heart issues etc. We have also discussed the potential for injuries to other organs, bile duct leaks, and other unexpected events. We have also talked about the fact that this may need to be converted to open under certain circumstances. We discussed the typical post op recovery and the fact that there is a good likelihood of improvement in symptoms and return to normal activity.  He understands this and wishes to proceed to schedule surgery. I believe all of his questions have been answered.  

## 2012-09-02 NOTE — Patient Instructions (Addendum)
Surgery scheduled on Aug 29.

## 2012-09-08 ENCOUNTER — Encounter (HOSPITAL_COMMUNITY): Payer: Self-pay | Admitting: Pharmacy Technician

## 2012-09-15 ENCOUNTER — Encounter (HOSPITAL_COMMUNITY): Payer: Self-pay

## 2012-09-15 ENCOUNTER — Encounter (HOSPITAL_COMMUNITY)
Admission: RE | Admit: 2012-09-15 | Discharge: 2012-09-15 | Disposition: A | Discharge status: Home / Self Care | Payer: Medicare Other | Source: Ambulatory Visit | Attending: Urology | Admitting: Urology

## 2012-09-15 DIAGNOSIS — N289 Disorder of kidney and ureter, unspecified: Secondary | ICD-10-CM | POA: Insufficient documentation

## 2012-09-15 DIAGNOSIS — Z01812 Encounter for preprocedural laboratory examination: Secondary | ICD-10-CM | POA: Insufficient documentation

## 2012-09-15 HISTORY — DX: Headache: R51

## 2012-09-15 HISTORY — DX: Other malformations of cerebral vessels: Q28.3

## 2012-09-15 HISTORY — DX: Other specified disorders of kidney and ureter: N28.89

## 2012-09-15 HISTORY — DX: Hyperlipidemia, unspecified: E78.5

## 2012-09-15 HISTORY — DX: Umbilical hernia without obstruction or gangrene: K42.9

## 2012-09-15 HISTORY — DX: Atherosclerotic heart disease of native coronary artery without angina pectoris: I25.10

## 2012-09-15 HISTORY — DX: Unspecified osteoarthritis, unspecified site: M19.90

## 2012-09-15 LAB — COMPREHENSIVE METABOLIC PANEL
ALT: 14 U/L (ref 0–53)
AST: 21 U/L (ref 0–37)
Albumin: 3.8 g/dL (ref 3.5–5.2)
Alkaline Phosphatase: 57 U/L (ref 39–117)
BUN: 16 mg/dL (ref 6–23)
Chloride: 104 mEq/L (ref 96–112)
Potassium: 4.4 mEq/L (ref 3.5–5.1)
Sodium: 138 mEq/L (ref 135–145)
Total Bilirubin: 0.5 mg/dL (ref 0.3–1.2)

## 2012-09-15 LAB — CBC
HCT: 44.5 % (ref 39.0–52.0)
RDW: 12.2 % (ref 11.5–15.5)
WBC: 7 10*3/uL (ref 4.0–10.5)

## 2012-09-15 MED ORDER — CHLORHEXIDINE GLUCONATE 4 % EX LIQD: 1.0000 "application " | Freq: Once | CUTANEOUS | Status: DC

## 2012-09-15 NOTE — Pre-Procedure Instructions (Signed)
EKG, CARDIOLOGY OFFICE NOTE 08/01/12 WITH CARDIAC CLEARANCE FOR SURGERY AND EXERCISE STRESS ECHO REPORT 03/20/12 ON PT'S CHART FROM DR. BRIAN MUNLEY WITH [facility] IN [place]. CXR REPORT IN EPIC FROM 07/29/12.

## 2012-09-15 NOTE — Progress Notes (Signed)
09/15/12 1550  OBSTRUCTIVE SLEEP APNEA  Have you ever been diagnosed with sleep apnea through a sleep study? No  Do you snore loudly (loud enough to be heard through closed doors)?  1  Do you often feel tired, fatigued, or sleepy during the daytime? 0  Has anyone observed you stop breathing during your sleep? 1  Do you have, or are you being treated for high blood pressure? 1  BMI more than 35 kg/m2? 0  Age over 71 years old? 1  Neck circumference greater than 40 cm/18 inches? 0  Gender: 1  Obstructive Sleep Apnea Score 5  Score 4 or greater  Results sent to PCP

## 2012-09-15 NOTE — Patient Instructions (Signed)
YOUR SURGERY IS SCHEDULED AT [facility]  ON:  Friday  8/29  REPORT TO [place] [facility] AT:  5:15 AM      PHONE # FOR SHORT STAY IS [phone]  FOLLOW BOWEL PREP INSTRUCTIONS DAY BEFORE SURGERY - INSTRUCTIONS FROM DR. Hilario Quarry OFFICE.  DO NOT EAT OR DRINK ANYTHING AFTER MIDNIGHT THE NIGHT BEFORE YOUR SURGERY.  YOU MAY BRUSH YOUR TEETH, RINSE OUT YOUR MOUTH--BUT NO WATER, NO FOOD, NO CHEWING GUM, NO MINTS, NO CANDIES, NO CHEWING TOBACCO.  PLEASE TAKE THE FOLLOWING MEDICATIONS THE AM OF YOUR SURGERY WITH A FEW SIPS OF WATER:  AMLODIPINE, CARVEDILOL   DO NOT BRING VALUABLES, MONEY, CREDIT CARDS.  DO NOT WEAR JEWELRY, MAKE-UP, NAIL POLISH AND NO METAL PINS OR CLIPS IN YOUR HAIR. CONTACT LENS, DENTURES / PARTIALS, GLASSES SHOULD NOT BE WORN TO SURGERY AND IN MOST CASES-HEARING AIDS WILL NEED TO BE REMOVED.  BRING YOUR GLASSES CASE, ANY EQUIPMENT NEEDED FOR YOUR CONTACT LENS. FOR PATIENTS ADMITTED TO THE HOSPITAL--CHECK OUT TIME THE DAY OF DISCHARGE IS 11:00 AM.  ALL INPATIENT ROOMS ARE PRIVATE - WITH BATHROOM, TELEPHONE, TELEVISION AND WIFI INTERNET.                             PLEASE READ OVER ANY  FACT SHEETS THAT YOU WERE GIVEN:  BLOOD TRANSFUSION INFORMATION, FAILURE TO FOLLOW THESE INSTRUCTIONS MAY RESULT IN THE CANCELLATION OF YOUR SURGERY.   PATIENT SIGNATURE_________________________________

## 2012-09-18 MED ORDER — CEFAZOLIN SODIUM-DEXTROSE 2-3 GM-% IV SOLR
2.0000 g | INTRAVENOUS | Status: AC
  Administered 2012-09-19 (×2): 2 g via INTRAVENOUS

## 2012-09-19 ENCOUNTER — Ambulatory Visit (HOSPITAL_COMMUNITY): Payer: Medicare Other | Admitting: Anesthesiology

## 2012-09-19 ENCOUNTER — Encounter (HOSPITAL_COMMUNITY)
Admission: RE | Disposition: A | Discharge status: Home / Self Care | Payer: Self-pay | Source: Ambulatory Visit | Attending: Urology

## 2012-09-19 ENCOUNTER — Encounter (HOSPITAL_COMMUNITY): Payer: Self-pay | Admitting: Anesthesiology

## 2012-09-19 ENCOUNTER — Inpatient Hospital Stay (HOSPITAL_COMMUNITY)
Admission: RE | Admit: 2012-09-19 | Discharge: 2012-09-23 | DRG: 660 | Disposition: A | Discharge status: Home / Self Care | Payer: Medicare Other | Source: Ambulatory Visit | Attending: Urology | Admitting: Urology

## 2012-09-19 ENCOUNTER — Encounter (HOSPITAL_COMMUNITY): Payer: Self-pay | Admitting: *Deleted

## 2012-09-19 DIAGNOSIS — I10 Essential (primary) hypertension: Secondary | ICD-10-CM | POA: Diagnosis present

## 2012-09-19 DIAGNOSIS — K560 Paralytic ileus: Secondary | ICD-10-CM | POA: Diagnosis not present

## 2012-09-19 DIAGNOSIS — R1310 Dysphagia, unspecified: Secondary | ICD-10-CM | POA: Diagnosis present

## 2012-09-19 DIAGNOSIS — K8020 Calculus of gallbladder without cholecystitis without obstruction: Secondary | ICD-10-CM | POA: Diagnosis present

## 2012-09-19 DIAGNOSIS — K3189 Other diseases of stomach and duodenum: Secondary | ICD-10-CM | POA: Diagnosis present

## 2012-09-19 DIAGNOSIS — K929 Disease of digestive system, unspecified: Secondary | ICD-10-CM | POA: Diagnosis not present

## 2012-09-19 DIAGNOSIS — I252 Old myocardial infarction: Secondary | ICD-10-CM

## 2012-09-19 DIAGNOSIS — Z951 Presence of aortocoronary bypass graft: Secondary | ICD-10-CM

## 2012-09-19 DIAGNOSIS — I2510 Atherosclerotic heart disease of native coronary artery without angina pectoris: Secondary | ICD-10-CM | POA: Diagnosis present

## 2012-09-19 DIAGNOSIS — C61 Malignant neoplasm of prostate: Secondary | ICD-10-CM | POA: Diagnosis present

## 2012-09-19 DIAGNOSIS — K429 Umbilical hernia without obstruction or gangrene: Secondary | ICD-10-CM | POA: Diagnosis present

## 2012-09-19 DIAGNOSIS — K5710 Diverticulosis of small intestine without perforation or abscess without bleeding: Secondary | ICD-10-CM | POA: Diagnosis present

## 2012-09-19 DIAGNOSIS — K8010 Calculus of gallbladder with chronic cholecystitis without obstruction: Secondary | ICD-10-CM

## 2012-09-19 DIAGNOSIS — M129 Arthropathy, unspecified: Secondary | ICD-10-CM | POA: Diagnosis present

## 2012-09-19 DIAGNOSIS — Q279 Congenital malformation of peripheral vascular system, unspecified: Secondary | ICD-10-CM

## 2012-09-19 DIAGNOSIS — Z7982 Long term (current) use of aspirin: Secondary | ICD-10-CM

## 2012-09-19 DIAGNOSIS — I440 Atrioventricular block, first degree: Secondary | ICD-10-CM | POA: Diagnosis present

## 2012-09-19 DIAGNOSIS — Z7902 Long term (current) use of antithrombotics/antiplatelets: Secondary | ICD-10-CM

## 2012-09-19 DIAGNOSIS — E785 Hyperlipidemia, unspecified: Secondary | ICD-10-CM | POA: Diagnosis present

## 2012-09-19 DIAGNOSIS — N289 Disorder of kidney and ureter, unspecified: Principal | ICD-10-CM | POA: Diagnosis present

## 2012-09-19 DIAGNOSIS — Z79899 Other long term (current) drug therapy: Secondary | ICD-10-CM

## 2012-09-19 HISTORY — PX: ROBOTIC ASSITED PARTIAL NEPHRECTOMY: SHX6087

## 2012-09-19 HISTORY — PX: CHOLECYSTECTOMY: SHX55

## 2012-09-19 LAB — TYPE AND SCREEN
ABO/RH(D): O POS
Antibody Screen: NEGATIVE

## 2012-09-19 LAB — HEMOGLOBIN AND HEMATOCRIT, BLOOD
HCT: 38.2 % — ABNORMAL LOW (ref 39.0–52.0)
Hemoglobin: 13.1 g/dL (ref 13.0–17.0)

## 2012-09-19 SURGERY — ROBOTIC ASSITED PARTIAL NEPHRECTOMY
Anesthesia: General | Laterality: Right | Wound class: Clean

## 2012-09-19 MED ORDER — LACTATED RINGERS IR SOLN
Status: DC | PRN
  Administered 2012-09-19: 1000 mL

## 2012-09-19 MED ORDER — MORPHINE SULFATE 2 MG/ML IJ SOLN
2.0000 mg | INTRAMUSCULAR | Status: DC | PRN
  Administered 2012-09-19 (×3): 2 mg via INTRAVENOUS
  Administered 2012-09-20: 4 mg via INTRAVENOUS
  Administered 2012-09-20 – 2012-09-21 (×6): 2 mg via INTRAVENOUS
  Administered 2012-09-21: 4 mg via INTRAVENOUS
  Filled 2012-09-19: qty 1
  Filled 2012-09-19: qty 2
  Filled 2012-09-19 (×7): qty 1
  Filled 2012-09-19: qty 2
  Filled 2012-09-19: qty 1

## 2012-09-19 MED ORDER — DEXAMETHASONE SODIUM PHOSPHATE 10 MG/ML IJ SOLN
INTRAMUSCULAR | Status: DC | PRN
  Administered 2012-09-19: 10 mg via INTRAVENOUS

## 2012-09-19 MED ORDER — OXYCODONE-ACETAMINOPHEN 5-325 MG PO TABS
1.0000 | ORAL_TABLET | ORAL | Status: DC | PRN
  Administered 2012-09-20 – 2012-09-23 (×7): 2 via ORAL
  Filled 2012-09-19 (×7): qty 2

## 2012-09-19 MED ORDER — GLYCOPYRROLATE 0.2 MG/ML IJ SOLN
INTRAMUSCULAR | Status: DC | PRN
  Administered 2012-09-19: .8 mg via INTRAVENOUS

## 2012-09-19 MED ORDER — PROPOFOL 10 MG/ML IV BOLUS: INTRAVENOUS | Status: DC | PRN

## 2012-09-19 MED ORDER — HYDROMORPHONE HCL PF 1 MG/ML IJ SOLN
INTRAMUSCULAR | Status: AC
  Filled 2012-09-19: qty 1

## 2012-09-19 MED ORDER — STERILE WATER FOR IRRIGATION IR SOLN
Status: DC | PRN
  Administered 2012-09-19: 3000 mL

## 2012-09-19 MED ORDER — LIDOCAINE HCL (CARDIAC) 20 MG/ML IV SOLN
INTRAVENOUS | Status: DC | PRN
  Administered 2012-09-19: 70 mg via INTRAVENOUS

## 2012-09-19 MED ORDER — FENTANYL CITRATE 0.05 MG/ML IJ SOLN
INTRAMUSCULAR | Status: DC | PRN
  Administered 2012-09-19 (×5): 50 ug via INTRAVENOUS

## 2012-09-19 MED ORDER — LACTATED RINGERS IV SOLN: INTRAVENOUS | Status: DC

## 2012-09-19 MED ORDER — CEFAZOLIN SODIUM-DEXTROSE 2-3 GM-% IV SOLR
INTRAVENOUS | Status: AC
  Filled 2012-09-19: qty 50

## 2012-09-19 MED ORDER — PHENYLEPHRINE HCL 10 MG/ML IJ SOLN
INTRAMUSCULAR | Status: DC | PRN
  Administered 2012-09-19 (×5): 80 ug via INTRAVENOUS

## 2012-09-19 MED ORDER — HYDROMORPHONE HCL PF 1 MG/ML IJ SOLN
0.2500 mg | INTRAMUSCULAR | Status: DC | PRN
  Administered 2012-09-19 (×2): 0.5 mg via INTRAVENOUS

## 2012-09-19 MED ORDER — MANNITOL 25 % IV SOLN
25.0000 g | Freq: Once | INTRAVENOUS | Status: DC
  Filled 2012-09-19: qty 100

## 2012-09-19 MED ORDER — SODIUM CHLORIDE 0.9 % IV SOLN
INTRAVENOUS | Status: DC
  Administered 2012-09-19: 1000 mL via INTRAVENOUS
  Administered 2012-09-19 – 2012-09-23 (×8): via INTRAVENOUS

## 2012-09-19 MED ORDER — OXYCODONE-ACETAMINOPHEN 5-325 MG PO TABS: 1.0000 | ORAL_TABLET | ORAL | Status: DC | PRN

## 2012-09-19 MED ORDER — MANNITOL 25 % IV SOLN
INTRAVENOUS | Status: DC | PRN
  Administered 2012-09-19 (×2): 12.5 g via INTRAVENOUS

## 2012-09-19 MED ORDER — MIDAZOLAM HCL 5 MG/5ML IJ SOLN
INTRAMUSCULAR | Status: DC | PRN
  Administered 2012-09-19: 2 mg via INTRAVENOUS

## 2012-09-19 MED ORDER — ESMOLOL HCL 10 MG/ML IV SOLN
INTRAVENOUS | Status: DC | PRN
  Administered 2012-09-19: 10 mg via INTRAVENOUS

## 2012-09-19 MED ORDER — CARVEDILOL 25 MG PO TABS
25.0000 mg | ORAL_TABLET | Freq: Two times a day (BID) | ORAL | Status: DC
  Administered 2012-09-19 – 2012-09-23 (×7): 25 mg via ORAL
  Filled 2012-09-19 (×10): qty 1

## 2012-09-19 MED ORDER — SENNOSIDES-DOCUSATE SODIUM 8.6-50 MG PO TABS
1.0000 | ORAL_TABLET | Freq: Two times a day (BID) | ORAL | Status: DC
Start: 2012-09-19 — End: 2017-03-06

## 2012-09-19 MED ORDER — MENTHOL 3 MG MT LOZG: 1.0000 | LOZENGE | OROMUCOSAL | Status: DC | PRN

## 2012-09-19 MED ORDER — BUPIVACAINE-EPINEPHRINE 0.25% -1:200000 IJ SOLN
INTRAMUSCULAR | Status: DC | PRN
  Administered 2012-09-19: 30 mL

## 2012-09-19 MED ORDER — HYDROCODONE-ACETAMINOPHEN 5-325 MG PO TABS: 1.0000 | ORAL_TABLET | ORAL | Status: DC | PRN

## 2012-09-19 MED ORDER — ROCURONIUM BROMIDE 100 MG/10ML IV SOLN
INTRAVENOUS | Status: DC | PRN
  Administered 2012-09-19: 20 mg via INTRAVENOUS
  Administered 2012-09-19: 30 mg via INTRAVENOUS
  Administered 2012-09-19: 20 mg via INTRAVENOUS
  Administered 2012-09-19: 50 mg via INTRAVENOUS
  Administered 2012-09-19: 10 mg via INTRAVENOUS
  Administered 2012-09-19: 20 mg via INTRAVENOUS
  Administered 2012-09-19: 30 mg via INTRAVENOUS

## 2012-09-19 MED ORDER — LOSARTAN POTASSIUM 50 MG PO TABS
100.0000 mg | ORAL_TABLET | Freq: Every morning | ORAL | Status: DC
  Administered 2012-09-20 – 2012-09-23 (×3): 100 mg via ORAL
  Filled 2012-09-19 (×4): qty 2

## 2012-09-19 MED ORDER — ONDANSETRON HCL 4 MG/2ML IJ SOLN
INTRAMUSCULAR | Status: DC | PRN
  Administered 2012-09-19: 4 mg via INTRAVENOUS

## 2012-09-19 MED ORDER — BUPIVACAINE-EPINEPHRINE PF 0.25-1:200000 % IJ SOLN
INTRAMUSCULAR | Status: AC
  Filled 2012-09-19: qty 30

## 2012-09-19 MED ORDER — PROPOFOL 10 MG/ML IV BOLUS
INTRAVENOUS | Status: DC | PRN
  Administered 2012-09-19: 125 mg via INTRAVENOUS

## 2012-09-19 MED ORDER — NEOSTIGMINE METHYLSULFATE 1 MG/ML IJ SOLN
INTRAMUSCULAR | Status: DC | PRN
  Administered 2012-09-19: 5 mg via INTRAVENOUS

## 2012-09-19 MED ORDER — SENNOSIDES-DOCUSATE SODIUM 8.6-50 MG PO TABS
1.0000 | ORAL_TABLET | Freq: Two times a day (BID) | ORAL | Status: DC
  Administered 2012-09-19 – 2012-09-23 (×4): 1 via ORAL
  Filled 2012-09-19 (×9): qty 1

## 2012-09-19 MED ORDER — LACTATED RINGERS IV SOLN
INTRAVENOUS | Status: DC | PRN
  Administered 2012-09-19 (×3): via INTRAVENOUS

## 2012-09-19 MED ORDER — AMLODIPINE BESYLATE 5 MG PO TABS
5.0000 mg | ORAL_TABLET | Freq: Every morning | ORAL | Status: DC
  Administered 2012-09-20 – 2012-09-23 (×3): 5 mg via ORAL
  Filled 2012-09-19 (×4): qty 1

## 2012-09-19 MED ORDER — SODIUM CHLORIDE 0.9 % IV BOLUS (SEPSIS)
1000.0000 mL | Freq: Once | INTRAVENOUS | Status: AC
  Administered 2012-09-19: 1000 mL via INTRAVENOUS

## 2012-09-19 MED ORDER — CEFAZOLIN SODIUM-DEXTROSE 2-3 GM-% IV SOLR
2.0000 g | Freq: Once | INTRAVENOUS | Status: DC
  Filled 2012-09-19: qty 50

## 2012-09-19 MED ORDER — EPHEDRINE SULFATE 50 MG/ML IJ SOLN
INTRAMUSCULAR | Status: DC | PRN
  Administered 2012-09-19 (×3): 10 mg via INTRAVENOUS

## 2012-09-19 MED ORDER — ACETAMINOPHEN 325 MG PO TABS: 650.0000 mg | ORAL_TABLET | ORAL | Status: DC | PRN

## 2012-09-19 MED ORDER — PROMETHAZINE HCL 25 MG/ML IJ SOLN: 6.2500 mg | INTRAMUSCULAR | Status: DC | PRN

## 2012-09-19 MED ORDER — DIVALPROEX SODIUM ER 250 MG PO TB24
250.0000 mg | ORAL_TABLET | Freq: Every day | ORAL | Status: DC
  Administered 2012-09-19 – 2012-09-22 (×4): 250 mg via ORAL
  Filled 2012-09-19 (×5): qty 1

## 2012-09-19 MED ORDER — MANNITOL 25 % IV SOLN
12.5000 g | Freq: Once | INTRAVENOUS | Status: DC
  Filled 2012-09-19: qty 50

## 2012-09-19 MED ORDER — PHENOL 1.4 % MT LIQD
1.0000 | OROMUCOSAL | Status: DC | PRN
  Administered 2012-09-19: 1 via OROMUCOSAL
  Filled 2012-09-19: qty 177

## 2012-09-19 MED ORDER — PHENYLEPHRINE HCL 10 MG/ML IJ SOLN
20.0000 mg | INTRAVENOUS | Status: DC | PRN
  Administered 2012-09-19: 30 ug/min via INTRAVENOUS

## 2012-09-19 SURGICAL SUPPLY — 108 items
ADH SKN CLS APL DERMABOND .7 (GAUZE/BANDAGES/DRESSINGS)
APL ESCP 34 STRL LF DISP (HEMOSTASIS) ×2
APPLICATOR SURGIFLO ENDO (HEMOSTASIS) ×3 IMPLANT
APPLIER CLIP 5 13 M/L LIGAMAX5 (MISCELLANEOUS)
APPLIER CLIP ROT 10 11.4 M/L (STAPLE)
APR CLP MED LRG 11.4X10 (STAPLE)
APR CLP MED LRG 5 ANG JAW (MISCELLANEOUS)
BAG SPEC RTRVL LRG 6X4 10 (ENDOMECHANICALS) ×4
CABLE HIGH FREQUENCY MONO STRZ (ELECTRODE) IMPLANT
CANISTER SUCTION 2500CC (MISCELLANEOUS) ×3 IMPLANT
CANNULA SEAL DVNC (CANNULA) ×2 IMPLANT
CANNULA SEALS DA VINCI (CANNULA) ×1
CATH FOLEY 2WAY SLVR  5CC 18FR (CATHETERS)
CATH FOLEY 2WAY SLVR 5CC 18FR (CATHETERS) IMPLANT
CHLORAPREP W/TINT 26ML (MISCELLANEOUS) ×3 IMPLANT
CLIP APPLIE 5 13 M/L LIGAMAX5 (MISCELLANEOUS) IMPLANT
CLIP APPLIE ROT 10 11.4 M/L (STAPLE) IMPLANT
CLIP LIGATING HEM O LOK PURPLE (MISCELLANEOUS) ×6 IMPLANT
CLIP LIGATING HEMO O LOK GREEN (MISCELLANEOUS) ×9 IMPLANT
CLIP SUT LAPRA TY ABSORB (SUTURE) IMPLANT
CLOTH BEACON ORANGE TIMEOUT ST (SAFETY) ×3 IMPLANT
CORD HIGH FREQUENCY UNIPOLAR (ELECTROSURGICAL) ×3 IMPLANT
CORDS BIPOLAR (ELECTRODE) ×3 IMPLANT
COVER MAYO STAND STRL (DRAPES) IMPLANT
COVER SURGICAL LIGHT HANDLE (MISCELLANEOUS) ×3 IMPLANT
COVER TIP SHEARS 8 DVNC (MISCELLANEOUS) ×2 IMPLANT
COVER TIP SHEARS 8MM DA VINCI (MISCELLANEOUS) ×1
DECANTER SPIKE VIAL GLASS SM (MISCELLANEOUS) IMPLANT
DERMABOND ADVANCED (GAUZE/BANDAGES/DRESSINGS)
DERMABOND ADVANCED .7 DNX12 (GAUZE/BANDAGES/DRESSINGS) IMPLANT
DRAIN CHANNEL 15F RND FF 3/16 (WOUND CARE) ×3 IMPLANT
DRAPE C-ARM 42X120 X-RAY (DRAPES) IMPLANT
DRAPE INCISE IOBAN 66X45 STRL (DRAPES) ×3 IMPLANT
DRAPE LAPAROSCOPIC ABDOMINAL (DRAPES) ×3 IMPLANT
DRAPE LG THREE QUARTER DISP (DRAPES) ×6 IMPLANT
DRAPE TABLE BACK 44X90 PK DISP (DRAPES) IMPLANT
DRAPE UTILITY XL STRL (DRAPES) ×3 IMPLANT
DRAPE WARM FLUID 44X44 (DRAPE) ×3 IMPLANT
DRESSING SURGICEL FIBRLLR 1X2 (HEMOSTASIS) IMPLANT
DRSG SURGICEL FIBRILLAR 1X2 (HEMOSTASIS)
DRSG TEGADERM 2-3/8X2-3/4 SM (GAUZE/BANDAGES/DRESSINGS) ×15 IMPLANT
DRSG TEGADERM 4X4.75 (GAUZE/BANDAGES/DRESSINGS) ×6 IMPLANT
ELECT REM PT RETURN 9FT ADLT (ELECTROSURGICAL) ×6
ELECTRODE REM PT RTRN 9FT ADLT (ELECTROSURGICAL) ×4 IMPLANT
EVACUATOR SILICONE 100CC (DRAIN) ×3 IMPLANT
GAUZE VASELINE 3X9 (GAUZE/BANDAGES/DRESSINGS) IMPLANT
GLOVE BIO SURGEON STRL SZ 6.5 (GLOVE) ×15 IMPLANT
GLOVE BIOGEL M STRL SZ7.5 (GLOVE) ×6 IMPLANT
GLOVE BIOGEL PI IND STRL 7.0 (GLOVE) ×4 IMPLANT
GLOVE BIOGEL PI IND STRL 7.5 (GLOVE) ×4 IMPLANT
GLOVE BIOGEL PI INDICATOR 7.0 (GLOVE) ×2
GLOVE BIOGEL PI INDICATOR 7.5 (GLOVE) ×2
GLOVE ECLIPSE 7.0 STRL STRAW (GLOVE) ×6 IMPLANT
GOWN BRE IMP PREV XXLGXLNG (GOWN DISPOSABLE) IMPLANT
GOWN STRL NON-REIN LRG LVL3 (GOWN DISPOSABLE) ×6 IMPLANT
GOWN STRL REIN 2XL XLG LVL4 (GOWN DISPOSABLE) ×6 IMPLANT
GOWN STRL REIN XL XLG (GOWN DISPOSABLE) ×9 IMPLANT
HEMOSTAT SURGICEL 4X8 (HEMOSTASIS) IMPLANT
KIT ACCESSORY DA VINCI DISP (KITS) ×1
KIT ACCESSORY DVNC DISP (KITS) ×2 IMPLANT
KIT BASIN OR (CUSTOM PROCEDURE TRAY) ×3 IMPLANT
NS IRRIG 1000ML POUR BTL (IV SOLUTION) IMPLANT
PENCIL BUTTON HOLSTER BLD 10FT (ELECTRODE) ×3 IMPLANT
POSITIONER SURGICAL ARM (MISCELLANEOUS) ×6 IMPLANT
POUCH SPECIMEN RETRIEVAL 10MM (ENDOMECHANICALS) ×6 IMPLANT
RELOAD BLUE (STAPLE) IMPLANT
RELOAD WHITE ECR60W (STAPLE) IMPLANT
SCISSORS ENDO CVD 5DCS (MISCELLANEOUS) IMPLANT
SCRUB PCMX 4 OZ (MISCELLANEOUS) IMPLANT
SET CHOLANGIOGRAPH MIX (MISCELLANEOUS) IMPLANT
SET IRRIG TUBING LAPAROSCOPIC (IRRIGATION / IRRIGATOR) IMPLANT
SET TUBE IRRIG SUCTION NO TIP (IRRIGATION / IRRIGATOR) ×3 IMPLANT
SOLUTION ANTI FOG 6CC (MISCELLANEOUS) ×6 IMPLANT
SOLUTION ELECTROLUBE (MISCELLANEOUS) ×3 IMPLANT
SPONGE LAP 18X18 X RAY DECT (DISPOSABLE) IMPLANT
STAPLE ECHEON FLEX 60 POW ENDO (STAPLE) IMPLANT
SURGIFLO W/THROMBIN 8M KIT (HEMOSTASIS) ×3 IMPLANT
SUT ETHILON 3 0 PS 1 (SUTURE) ×3 IMPLANT
SUT MNCRL AB 4-0 PS2 18 (SUTURE) IMPLANT
SUT MON AB 2-0 SH 27 (SUTURE) IMPLANT
SUT MON AB 2-0 SH27 (SUTURE) IMPLANT
SUT PROLENE 0 CT 2 (SUTURE) ×9 IMPLANT
SUT V-LOC BARB 180 2/0GR6 GS22 (SUTURE) ×3
SUT VIC AB 0 CT1 27 (SUTURE) ×3
SUT VIC AB 0 CT1 27XBRD ANTBC (SUTURE) ×2 IMPLANT
SUT VIC AB 0 UR5 27 (SUTURE) IMPLANT
SUT VIC AB 2-0 SH 27 (SUTURE)
SUT VIC AB 2-0 SH 27X BRD (SUTURE) IMPLANT
SUT VIC AB 4-0 RB1 27 (SUTURE)
SUT VIC AB 4-0 RB1 27XBRD (SUTURE) IMPLANT
SUT VICRYL 0 UR6 27IN ABS (SUTURE) ×6 IMPLANT
SUT VLOC BARB 180 ABS3/0GR12 (SUTURE) ×3
SUTURE V-LC BRB 180 2/0GR6GS22 (SUTURE) ×2 IMPLANT
SUTURE VLOC BRB 180 ABS3/0GR12 (SUTURE) ×2 IMPLANT
SYR BULB IRRIGATION 50ML (SYRINGE) IMPLANT
TOWEL OR 17X26 10 PK STRL BLUE (TOWEL DISPOSABLE) ×6 IMPLANT
TOWEL OR NON WOVEN STRL DISP B (DISPOSABLE) ×6 IMPLANT
TRAY FOLEY CATH 14FRSI W/METER (CATHETERS) ×3 IMPLANT
TRAY LAP CHOLE (CUSTOM PROCEDURE TRAY) ×3 IMPLANT
TROCAR 12M 150ML BLUNT (TROCAR) ×3 IMPLANT
TROCAR BLADELESS OPT 5 100 (ENDOMECHANICALS) ×3 IMPLANT
TROCAR BLADELESS OPT 5 75 (ENDOMECHANICALS) IMPLANT
TROCAR ENDOPATH XCEL 12X100 BL (ENDOMECHANICALS) ×3 IMPLANT
TROCAR SLEEVE XCEL 5X75 (ENDOMECHANICALS) IMPLANT
TROCAR XCEL 12X100 BLDLESS (ENDOMECHANICALS) ×6 IMPLANT
TROCAR XCEL BLUNT TIP 100MML (ENDOMECHANICALS) ×3 IMPLANT
TUBING INSUFFLATION 10FT LAP (TUBING) ×3 IMPLANT
WATER STERILE IRR 1500ML POUR (IV SOLUTION) IMPLANT

## 2012-09-19 NOTE — H&P (View-Only) (Signed)
Chief Complaint   Patient presents with   .  Cholelithiasis     eval gallstones   HPI  [name] [name] is a 72 y.o. male. He is going to be having robotic partial nephrectomy and was recently found to have gallstones after an attack of biliary colic which he took him to the emergency room. That episode started as mild indigestion and progressed to significant epigastric and right upper quadrant pain associated with nausea. It eventually responded to some pain medication given in the emergency department. An ultrasound showed gallstones, mildly thickened wall and a normal cbd diameter. He has not had any further episodes. He has been avoiding fatty foods since the one episode.   HPI  Past Medical History   Diagnosis  Date   .  Cancer      prostate   .  Hypertension    .  Myocardial infarction     Past Surgical History   Procedure  Laterality  Date   .  Replacement total hip w/ resurfacing implants     .  Prostate surgery     .  Coronary artery bypass graft     .  Replacement total knee     .  Rotator cuff repair      Family History   Problem  Relation  Age of Onset   .  Heart attack  Mother    Social History  History   Substance Use Topics   .  Smoking status:  Never Smoker   .  Smokeless tobacco:  Never Used   .  Alcohol Use:  No   No Known Allergies  Current Outpatient Prescriptions   Medication  Sig  Dispense  Refill   .  amLODipine (NORVASC) 5 MG tablet      .  aspirin 81 MG tablet  Take 81 mg by mouth daily.     .  carvedilol (COREG) 25 MG tablet  Take 25 mg by mouth 2 (two) times daily with a meal.     .  clopidogrel (PLAVIX) 75 MG tablet  Take 75 mg by mouth daily.     .  divalproex (DEPAKOTE ER) 250 MG 24 hr tablet      .  losartan (COZAAR) 100 MG tablet       No current facility-administered medications for this visit.   Review of Systems  Review of Systems  Constitutional: Negative for fever, chills and unexpected weight change.  HENT: Negative for hearing loss,  congestion, sore throat, trouble swallowing and voice change.  Eyes: Negative for visual disturbance.  Respiratory: Negative for cough and wheezing.  Cardiovascular: Negative for chest pain, palpitations and leg swelling.  Gastrointestinal: Negative for nausea, vomiting, abdominal pain, diarrhea, constipation, blood in stool, abdominal distention, anal bleeding and rectal pain.  Genitourinary: Negative for hematuria and difficulty urinating.  Musculoskeletal: Negative for arthralgias.  Skin: Negative for rash and wound.  Neurological: Negative for seizures, syncope, weakness and headaches.  Hematological: Negative for adenopathy. Does not bruise/bleed easily.  Psychiatric/Behavioral: Negative for confusion.    Physical Exam  Physical Exam  Vitals reviewed.  Filed Vitals:   09/02/12 1129  BP: 132/84  Pulse: 76  Resp: 14   Constitutional: He is oriented to person, place, and time. He appears well-developed and well-nourished. No distress.  HENT:  Head: Normocephalic and atraumatic.  Eyes: Conjunctivae and EOM are normal. Pupils are equal, round, and reactive to light. Left eye exhibits no discharge.  Neck: Normal range of  motion. Neck supple. No tracheal deviation present. No thyromegaly present.  Cardiovascular: Normal rate, regular rhythm, normal heart sounds  Pulmonary/Chest: Effort normal and breath sounds normal. No respiratory distress. He has no wheezes. He has no rales.  Abdominal: Soft. Bowel sounds are normal. He exhibits no distension and no mass. There is no tenderness. There is no rebound and no guarding. Umbilical hernia Musculoskeletal: Normal range of motion. He exhibits no edema.  Neurological: He is alert and oriented to person, place, and time.  Skin: Skin is warm and dry. No erythema.  Psychiatric: He has a normal mood and affect. His behavior is normal. Judgment and thought content normal.  Data Reviewed  RUQ [place] report reviewed.  Mildly thickened gb wall,  gallstones, normal cbd diameter LFTs WNL Assessment  Gallstones with one episode of biliary colic; mass right kidney scheduled for robotic excision  Plan  I recommended robotic cholecystectomy be done at the same time he has kidney surgery.  We will also repair his umbilical hernia.  I have discussed the indications for cholecystectomy with him  and provided educational material. We have discussed the risks of surgery, including general risks such as bleeding, infection, lung and heart issues etc. We have also discussed the potential for injuries to other organs, bile duct leaks, and other unexpected events. We have also talked about the fact that this may need to be converted to open under certain circumstances. We discussed the typical post op recovery and the fact that there is a good likelihood of improvement in symptoms and return to normal activity.  He understands this and wishes to proceed to schedule surgery. I believe all of his questions have been answered.

## 2012-09-19 NOTE — Progress Notes (Signed)
Urology History and Physical Exam  CC: Right renal mass  HPI:  72 year old male presents today for a right renal mass.  This was discovered incidentally for abdominal pain.  CT scan in June 2014 which revealed a renal mass.  It appeared to be enhancing.  It was mostly exophytic.  It is in the upper pole of the lateral portion of the kidney.  It measures 1.8 cm in size.  Follow-up MRI without and with contrast revealed that the mass was enhancing.  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  Workup with chest x-ray and laboratory values revealed no evidence of metastatic disease.  Baseline creatinine is 0.9 with baseline GFR of 84.  We discussed various management options along with the risks, benefits, alternatives, and likelihood of achieving goals.  He presents today for robotic-assisted laparoscopic right partial nephrectomy.  He also plans to have an elective cholecystectomy at the same time performed by Dr. Maisie Fus.  This has not been associated with gross hematuria.  UA from 07/29/12 was negative for signs of infection.  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  He was cleared by his cardiologist, Dr. Norman Herrlich at [facility], to hold his Plavix and aspirin 5 days prior to surgery.  It was recommended that he restart his aspirin one to 2 days after surgery.  It was also recommended that he have an EKG on postoperative day 1.  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  There is an accessory vein coming off of the IVC superior to the main renal vein which could be an accessory vein to the kidney or to the ipsilateral adrenal gland.  PMH: Past Medical History  Diagnosis Date  . Cancer     prostate  . Hypertension   . Myocardial infarction 04-14-07  . Cerebral vascular malformation     PT'S NEUROLOGIST IS DR. 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 Gallstones     ONE SEVERE EPISODE OF ABDOMINAL PAIN  . Hyperlipidemia   . Arthritis   . Mass of right kidney     FOUND ON WORK UP FOR ABD PAIN  . Coronary artery disease     HX OF MI, RIGHT BUNDLE BRANCH BLOCK  . Headache(784.0)     POSSIBLE TRIGEMINAL NEURALGIA   . Umbilical hernia     PSH: Past Surgical History  Procedure Laterality Date  . Replacement total hip w/  resurfacing implants    . Replacement total knee    . Rotator cuff repair  03/30/2008    LEFT  . Coronary angioplasty  04/14/07    STENT PLACEMENT  . Joint replacement      RIGHT HIP REPLACEMENT JAN 2003;  RIGHT HIP REVISION 7/20 2010;  LEFT KNEE REPLACEMENT 05/30/10  . Radioactive seed implant for prostate cancer  2 22 2006    Allergies: Allergies  Allergen Reactions  . Ativan [Lorazepam] Other (See Comments)    Sees things.     Medications: Prescriptions prior to admission  Medication Sig Dispense Refill  . amLODipine (NORVASC) 5 MG tablet Take 5 mg by mouth every morning.       . carvedilol (COREG) 25 MG tablet Take 25 mg by mouth 2 (two) times daily with a meal.      .  divalproex (DEPAKOTE ER) 250 MG 24 hr tablet Take 250 mg by mouth at bedtime.       Marland Kitchen losartan (COZAAR) 100 MG tablet Take 100 mg by mouth every morning.       Marland Kitchen aspirin 81 MG tablet Take 81 mg by mouth daily.      . clopidogrel (PLAVIX) 75 MG tablet Take 75 mg by mouth daily.         Social History: History   Social History  . Marital Status: Married    Spouse Name: N/A    Number of Children: N/A  . Years of Education: N/A   Occupational History  . Not on file.   Social History Main Topics  . Smoking status: Never Smoker   . Smokeless tobacco: Never Used  . Alcohol Use: No  . Drug Use: No  . Sexual Activity: Not on file   Other Topics Concern  . Not on file   Social History Narrative  . No narrative on file    Family  History: Family History  Problem Relation Age of Onset  . Heart attack Mother     Review of Systems: Positive: None. Negative: Chest pain, SOB, or fever.  A further 10 point review of systems was negative except what is listed in the HPI.  Physical Exam: Filed Vitals:   09/19/12 0516  BP: 129/78  Pulse: 66  Temp: 97.7 F (36.5 C)  Resp: 18    General: No acute distress.  Awake. Head:  Normocephalic.  Atraumatic. ENT:  EOMI.  Mucous membranes moist Neck:  Supple.  No lymphadenopathy. CV:  S1 present. S2 present. Regular rate. Pulmonary: Equal effort bilaterally.  Clear to auscultation bilaterally. Abdomen: Soft.  Non- tender to palpation. Skin:  Normal turgor.  No visible rash. Extremity: No gross deformity of bilateral upper extremities.  No gross deformity of    bilateral lower extremities. Neurologic: Alert. Appropriate mood.    Studies:  No results found for this basename: HGB, WBC, PLT,  in the last 72 hours  No results found for this basename: NA, K, CL, CO2, BUN, CREATININE, CALCIUM, MAGNESIUM, GFRNONAA, GFRAA,  in the last 72 hours   No results found for this basename: PT, INR, APTT,  in the last 72 hours   No components found with this basename: ABG,     Assessment:  Right renal mass.  Plan: To OR for robotic-assisted laparoscopic right partial nephrectomy.

## 2012-09-19 NOTE — Progress Notes (Signed)
Pharmacy Consult    Patient was on Depakote ER 250 mg po qhs, last dose past week. Pt presented 8/29 for R partial nephrectomy - MD reordered home dose with comment "PHARMACY PLEASE CONSULT re: DOES PT NEED DEPAKOTE LEVELS CHECKED".  Patient with no seizure activities recently.   Plan: Continue home dose of Depakote, no depakote levels needed at this time.   Geoffry Paradise, PharmD, BCPS Pager: [phone] 3:56 PM Pharmacy #: [phone]

## 2012-09-19 NOTE — Anesthesia Postprocedure Evaluation (Signed)
  Anesthesia Post-op Note  Patient: [name] [name]  Procedure(s) Performed: Procedure(s) (LRB): ROBOTIC ASSITED PARTIAL NEPHRECTOMY (Right) ROBOTIC ASSISTED LAPAROSCOPIC CHOLECYSTECTOMY WITH UMBILICAL HERNIA REPAIR (N/A)  Patient Location: PACU  Anesthesia Type: General  Level of Consciousness: awake and alert   Airway and Oxygen Therapy: Patient Spontanous Breathing  Post-op Pain: mild  Post-op Assessment: Post-op Vital signs reviewed, Patient's Cardiovascular Status Stable, Respiratory Function Stable, Patent Airway and No signs of Nausea or vomiting  Last Vitals:  Filed Vitals:   09/19/12 1515  BP: 130/80  Pulse: 81  Temp: 36.3 C  Resp: 16    Post-op Vital Signs: stable   Complications: No apparent anesthesia complications

## 2012-09-19 NOTE — Anesthesia Preprocedure Evaluation (Addendum)
Anesthesia Evaluation  Patient identified by MRN, date of birth, ID band Patient awake    Reviewed: Allergy & Precautions, H&P , NPO status , Patient's Chart, lab work & pertinent test results  Airway Mallampati: II TM Distance: >3 FB Neck ROM: Full    Dental no notable dental hx.    Pulmonary neg pulmonary ROS,  breath sounds clear to auscultation  Pulmonary exam normal       Cardiovascular hypertension, Pt. on medications + Past MI Rhythm:Regular Rate:Normal  CORONARY ANGIOPLASTY 04/14/07    Neuro/Psych negative neurological ROS  negative psych ROS   GI/Hepatic negative GI ROS, Neg liver ROS,   Endo/Other  negative endocrine ROS  Renal/GU negative Renal ROS  negative genitourinary   Musculoskeletal negative musculoskeletal ROS (+)   Abdominal   Peds negative pediatric ROS (+)  Hematology negative hematology ROS (+)   Anesthesia Other Findings   Reproductive/Obstetrics negative OB ROS                          Anesthesia Physical Anesthesia Plan  ASA: III  Anesthesia Plan: General   Post-op Pain Management:    Induction: Intravenous  Airway Management Planned: Oral ETT  Additional Equipment:   Intra-op Plan:   Post-operative Plan: Extubation in OR  Informed Consent: I have reviewed the patients History and Physical, chart, labs and discussed the procedure including the risks, benefits and alternatives for the proposed anesthesia with the patient or authorized representative who has indicated his/her understanding and acceptance.   Dental advisory given  Plan Discussed with: CRNA and Surgeon  Anesthesia Plan Comments:         Anesthesia Quick Evaluation

## 2012-09-19 NOTE — Preoperative (Signed)
Beta Blockers   Reason not to administer Beta Blockers:Not Applicable  Beta blocker given intraoperatively.

## 2012-09-19 NOTE — Op Note (Addendum)
09/19/2012  3:03 PM  PATIENT:  [name] [name]  72 y.o. male  Patient Care Team: Mila Homer. Orvan Falconer as PCP - General (Internal Medicine) Milford Cage, MD as Consulting Physician (Urology)  PRE-OPERATIVE DIAGNOSIS:  SYMPTOMATIC GALLSTONES  POST-OPERATIVE DIAGNOSIS:  SYMPTOMATIC GALLSTONES  PROCEDURE: ROBOTIC ASSISTED LAPAROSCOPIC CHOLECYSTECTOMY WITH UMBILICAL HERNIA REPAIR  SURGEON:  Surgeon(s): Milford Cage, MD Romie Levee, MD  ASSISTANT: Margarita Grizzle   ANESTHESIA:   general  EBL:  Total I/O In: 3075 [I.V.:3075] Out: 630 [Urine:455; Drains:115; Blood:60]  DRAINS: (31F) Jackson-Pratt drain(s) with closed bulb suction in the RUQ   SPECIMEN:  Source of Specimen:  gallbladder  DISPOSITION OF SPECIMEN:  PATHOLOGY  COUNTS:  YES  PLAN OF CARE: Admit to inpatient   PATIENT DISPOSITION:  PACU - hemodynamically stable.  INDICATION: This is a 72 y.o. M with a h/o biliary colic and gallstones.    The anatomy & physiology of hepatobiliary & pancreatic function was discussed.  The pathophysiology of gallbladder dysfunction was discussed.  Natural history risks without surgery was discussed.   I feel the risks of no intervention will lead to serious problems that outweigh the operative risks; therefore, I recommended cholecystectomy to remove the pathology.  I explained laparoscopic techniques with possible need for an open approach.  Probable cholangiogram to evaluate the bilary tract was explained as well.    Risks such as bleeding, infection, abscess, leak, injury to other organs, need for further treatment, heart attack, death, and other risks were discussed.  I noted a good likelihood this will help address the problem.  Possibility that this will not correct all abdominal symptoms was explained.  Goals of post-operative recovery were discussed as well.    OR FINDINGS: gallbladder with normal anatomy   DESCRIPTION:   The patient was identified & brought into the  operating room. The patient was positioned on a bean bag with arms tucked. SCDs were active during the entire case. The patient underwent general anesthesia without any difficulty.  The abdomen was prepped and draped in a sterile fashion. A Surgical Timeout was performed and confirmed our plan.  I entered the operating room after the partial nephrectomy was completed. The robot was undocked. The patient was rotated laterally in reverse Trendelenburg position.   I used his existing supraumbilical port to place the camera. The right arm was placed in a subxiphoid port it was previously used for the nephrectomy. I used the left arm and one of the previous robotic ports. We placed a retractor on the gallbladder fundus and elevated this superiorly. I then robotically dissected out the cystic duct and cystic artery using hook cautery. Once a good critical view of the cystic artery and cystic duct was performed, I did further dissection to free a few centimeters of the  gallbladder off the liver bed to get a good critical view of the infundibulum and cystic duct. I mobilized the cystic artery.  I skeletonized the cystic duct.  After getting a good 360 view, I decided not to perform a cholangiogram.  I placed a hemoclip on the infundibulum.  I placed hemoclips on the cystic duct x2.  I completed the cystic duct transection.   I placed clips on the cystic artery x2.  I ligated the cystic artery using scissors. I freed the gallbladder from its remaining attachments to the liver. I ensured hemostasis on the gallbladder fossa of the liver and elsewhere. I inspected the rest of the abdomen & detected no injury nor bleeding  elsewhere.  I irrigated the RUQ with normal saline.  I removed the gallbladder and the renal specimen through the umbilical port site.  I extended the umbilical incision. The hernia sack was dissected free and removed. I closed the umbilical fascia and hernia using 0 Prolene stitches.  The initial hernia  was <1cm in diameter.  The skin was closed using staples.  Sterile dressings were applied. The patient was extubated & arrived in the PACU in stable condition.  I had discussed postoperative care with the patient in the holding area.  I will discuss  operative findings and postoperative goals / instructions with the patient's family.  Instructions are written in the chart as well.

## 2012-09-19 NOTE — Transfer of Care (Signed)
Immediate Anesthesia Transfer of Care Note  Patient: [name] [name]  Procedure(s) Performed: Procedure(s): ROBOTIC ASSITED PARTIAL NEPHRECTOMY (Right) ROBOTIC ASSISTED LAPAROSCOPIC CHOLECYSTECTOMY WITH UMBILICAL HERNIA REPAIR (N/A)  Patient Location: PACU  Anesthesia Type:General  Level of Consciousness: awake, sedated and responds to stimulation  Airway & Oxygen Therapy: Patient Spontanous Breathing and Patient connected to face mask oxygen  Post-op Assessment: Report given to PACU RN and Post -op Vital signs reviewed and stable  Post vital signs: Reviewed and stable  Complications: No apparent anesthesia complications

## 2012-09-19 NOTE — Interval H&P Note (Signed)
History and Physical Interval Note:  09/19/2012 7:21 AM  [name] [name]  has presented today for surgery, with the diagnosis of RIGHT RENAL MASS/GALLSTONES  The various methods of treatment have been discussed with the patient and family. After consideration of risks, benefits and other options for treatment, the patient has consented to  Procedure(s): ROBOTIC ASSISTED LAPAROSCOPIC CHOLECYSTECTOMY as a surgical intervention.  The patient's history has been reviewed, patient examined, no change in status, stable for surgery.  I have reviewed the patient's chart and labs.  Questions were answered to the patient's satisfaction.     Vanita Panda, MD  Colorectal and General Surgery [facility]

## 2012-09-19 NOTE — Progress Notes (Signed)
GU post-op check.  Patient doing well. Has abdominal pain that is controlled.  I discussed the surgical findings and the course of the surgery with the patient and family.  Filed Vitals:   09/19/12 1515  BP: 130/80  Pulse: 81  Temp: 97.4 F (36.3 C)  Resp: 16   Gen: NAD, AAO CV: RRR, negative murmurs Chest: CTA-B Abd: soft, appropriately TTP, incisions dressed, JP w/ sanguinous output GU: Foley draining clear yellow urine.  A/P: Right partial nephrectomy for enhancing renal mass. -D/c foley in the morning. -Bedrest tonight. -Telemetry for h/o AV block.

## 2012-09-19 NOTE — Op Note (Signed)
Urology Operative Report  Date of Procedure: 09/19/12  Surgeon: Natalia Leatherwood, MD Assistant: Pecola Leisure, PA Dr. Romie Levee  Preoperative Diagnosis: Right enhancing renal mass Postoperative Diagnosis:  Same  Procedure(s): Robotic assisted right partial nephrectomy  Estimated blood loss: 60cc  Specimen: Right renal mass  Drains: JP drain. Foley catheter.  Complications: None  Findings: Right upper pole exophytic renal mass.  History of present illness: 72 year old male presents today for robotic-assisted right partial nephrectomy do to an incidentally discovered enhancing renal mass. He also has an umbilical hernia and symptomatic cholelithiasis. He'll therefore also have a laparoscopic cholecystectomy and an umbilical hernia repair by Dr. Maisie Fus.   Procedure in detail: After informed consent was obtained, the patient was taken to the operating room. They were placed in the supine position. SCDs were turned on and in place. IV antibiotics were infused, and general anesthesia was induced. A timeout was performed in which the correct patient, surgical site, and procedure were identified and agreed upon by the team. A Foley catheter was inserted using sterile technique. The hair was removed from the patient's abdomen and flank on the right side. He was then positioned on a beanbag in the lateral decubitus position with his right side up. An axillary roll was placed and arms and legs were positioned to prevent neurovascular injury padding them with gel and foam. An arm board was used to support the upper arm. The bed was flexed to allow separation of the pelvis from the costal margin. The beanbag was then desufflated and he was secured into place with tape and the safety belt.  The right flank and abdomen were then prepped and draped in the usual sterile fashion. Following this the first microscopic port was placed lateral and slightly inferior to the umbilicus by incising the  skin and dissecting down to the anterior fascia. This was incised and then the muscle was spread to avoid injury to the epigastric vessels. The posterior fascia was incised with a scalpel to place my finger through this and the peritoneum. I then placed a 12 mm trocar through this area and connected this to carbon dioxide. His abdomen was insufflated with appropriate low opening pressures. After his abdomen was sufficiently insufflated I inserted a camera and inspected the entire contents of the abdomen. There was no injury noted. Next 3 robotic ports were placed under direct visualization in the right upper quadrant, right lower quadrant, and just superior to the anterior superior iliac spine. Two 12 mm ports were placed in the midline superior to the umbilicus each approximately a handbreadth apart and then a 5 mm port was placed superior in the midline superior most 12 mm port to help retract the liver. This was all done under direct visualization. The robot was then docked.  Using a monopolar scissors my right hand and a fenestrated bipolar and the left hand as well as an atraumatic grasper and the third arm of the robot I was able to incise the white line of Toldt along the right side of the abdomen. This was reflected medially. The duodenum was then identified and this was also reflected medially. 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 I then moved my attention inferiorly the kidney and dissected into the retroperitoneum inferior to the kidney until I encountered the gonadal vein and the ureter. The gonadal vein was dissected inferiorly and superiorly and retracted medially while the ureter was retracted laterally to  avoid injury to the ureter. The gonadal vein was then dissected up until its insertion into the inferior vena cava. The inferior vena cava was identified and then the main right renal vein was identified. This was dissected  circumferentially and then the main branch of the right renal artery was identified. This was also dissected circumferentially. There was a small branch from the inferior vena cava feeding ensure as fashion that had to be sacrificed in order to mobilize the kidney.  Attention was then turned to the superior kidney. I incised the ureters fashion and dissected down to the capsule of the kidney. He was noted to have a type of fat that was very difficult to dissect away from the capsule of the kidney which prolonged the surgery. I was able to eventually identify the renal mass by utilizing intraoperative ultrasound. Once the mass was identified the kidney was positioned into position so that it would be more suitable for resection. He was given 12.5 g of mannitol intravenously. Attention was then turned back to the hilum. A bulldog clamp was placed first on the renal artery and then on the renal vein. Attention was turned to the mass which was dissected out using cold scissors. There were no gross positive margins. This was placed into a collection bag and then a suture was placed using a 3-0 V. lock suture running from the superior end of the incision through the capsule through the medial aspect of the defect and out the other side of the capsule. He walks were placed on either side of this to hold this in place. A 20 be lock suture was then used around the suture starting through the capsule and reapproximating the edges of the defect. After each pass from one side to the other a new hematoma was placed to maintain compression. This was run the length of the defect and then out the capsule and a final hemologic was placed. The 2 suture needles were then removed from the body. Attention was turned to the hilum and the bulldog clamps were removed from the renal vein and renal artery. Total clamping time was 25 minutes. There was good hemostasis noted at the wound. Gelfoam was placed over the wound. Drugs fascia was  then reapproximated using the lock clips.  Dr. Maisie Fus was then called to perform the cholecystectomy and the local hernia repair. Please refer to her dictation for aspect of the surgery.  Dr. Maisie Fus then assisted me and closing the 12 mm port sites for laparoscopic closure device with a 0 Vicryl suture. A JP drain was placed in the right lower quadrant robotic port. There was no bleeding from either port sites. They were all removed and the abdomen was desufflated. The cholecystectomy specimen in the renal specimen removed from the port site closest to the umbilicus. Dr. Maisie Fus repair the umbilical hernia at this site and close this primarily. I then closed the superficial tissue with interrupted Vicryl suture after irrigating vigorously with sterile saline. I then injected Marcaine with for total of 30 cc into the port sites. The skin was then closed with staples and covered with sterile dressing.  This completed the procedure. Patient is placed back in a supine position, anesthesia was reversed, and he was taken to the PACU in stable condition.  All counts were correct at the end of the case.

## 2012-09-20 LAB — HEMOGLOBIN AND HEMATOCRIT, BLOOD
HCT: 40 % (ref 39.0–52.0)
Hemoglobin: 13.1 g/dL (ref 13.0–17.0)

## 2012-09-20 LAB — BASIC METABOLIC PANEL
BUN: 15 mg/dL (ref 6–23)
Calcium: 8.6 mg/dL (ref 8.4–10.5)
Chloride: 106 mEq/L (ref 96–112)
Creatinine, Ser: 0.91 mg/dL (ref 0.50–1.35)
GFR calc Af Amer: >90 mL/min (ref >90)
GFR calc non Af Amer: 84 mL/min — ABNORMAL LOW (ref >90)

## 2012-09-20 MED ORDER — CALCIUM CARBONATE ANTACID 500 MG PO CHEW
400.0000 mg | CHEWABLE_TABLET | Freq: Three times a day (TID) | ORAL | Status: DC | PRN
  Administered 2012-09-20 – 2012-09-21 (×4): 400 mg via ORAL
  Filled 2012-09-20 (×5): qty 2

## 2012-09-20 NOTE — Progress Notes (Signed)
1 Day Post-Op robotic chole and and umbilical hernia repair Subjective: Doing well, pain controlled, tolerating clears  Objective: Vital signs in last 24 hours: Temp:  [97.4 F (36.3 C)-98.8 F (37.1 C)] 98.8 F (37.1 C) (08/30 0523) Pulse Rate:  [81-94] 89 (08/30 0523) Resp:  [14-21] 18 (08/30 0523) BP: (127-149)/(77-84) 149/79 mmHg (08/30 0523) SpO2:  [95 %-100 %] 98 % (08/30 0523) Weight:  [208 lb 15.9 oz (94.8 kg)] 208 lb 15.9 oz (94.8 kg) (08/29 1515)   Intake/Output from previous day: 08/29 0701 - 08/30 0700 In: 4575 [I.V.:4575] Out: 2115 [Urine:1805; Drains:250; Blood:60] Intake/Output this shift: Total I/O In: 600 [P.O.:600] Out: 302 [Urine:275; Drains:27]   General appearance: alert and cooperative GI: soft, appropriately tender  Incision: no significant drainage  Lab Results:   Recent Labs  09/19/12 1408 09/20/12 0530  HGB 13.1 13.1  HCT 38.2* 38.2*   BMET  Recent Labs  09/20/12 0530  NA 139  K 3.8  CL 106  CO2 27  GLUCOSE 120*  BUN 15  CREATININE 0.91  CALCIUM 8.6   PT/INR No results found for this basename: LABPROT, INR,  in the last 72 hours ABG No results found for this basename: PHART, PCO2, PO2, HCO3,  in the last 72 hours  MEDS, Scheduled . amLODipine  5 mg Oral q morning - 10a  . carvedilol  25 mg Oral BID WC  . divalproex  250 mg Oral QHS  . losartan  100 mg Oral q morning - 10a  . senna-docusate  1 tablet Oral BID    Studies/Results: No results found.  Assessment: s/p Procedure(s): ROBOTIC ASSITED PARTIAL NEPHRECTOMY ROBOTIC ASSISTED LAPAROSCOPIC CHOLECYSTECTOMY WITH UMBILICAL HERNIA REPAIR Patient Active Problem List   Diagnosis Date Noted  . Gallstones 08/07/2012      Plan: ok to advance diet from my standpoint Will see back in the office in 3 weeks Recommend a low fat diet upon discharge   LOS: 1 day     .Vanita Panda, MD [facility], [place] [phone]   09/20/2012 12:19  PM

## 2012-09-20 NOTE — Progress Notes (Signed)
Patient ID: [name] [name], male   DOB: 1940/07/07, 72 y.o.   MRN: [id number]  Patient sitting in chair. C/o sore throat.     Filed Vitals:   09/20/12 0523  BP: 149/79  Pulse: 89  Temp: 98.8 F (37.1 C)  Resp: 18   NAD A&Ox3 MAE Abd - soft, NT, ND, good BS Ext - no CCE  H/H 13/38 - stable BMET    Component Value Date/Time   NA 139 09/20/2012 0530   K 3.8 09/20/2012 0530   CL 106 09/20/2012 0530   CO2 27 09/20/2012 0530   GLUCOSE 120* 09/20/2012 0530   BUN 15 09/20/2012 0530   CREATININE 0.91 09/20/2012 0530   CALCIUM 8.6 09/20/2012 0530   GFRNONAA 84* 09/20/2012 0530   GFRAA >90 09/20/2012 0530    A/P - POD#1 RALap patial Nx, cholecystectomy and umb hernia repair.  -check H/H at 1600 - stable so far -ASA start tomorrow -EKG - stable, reviewed -ambulate, IS

## 2012-09-21 ENCOUNTER — Inpatient Hospital Stay (HOSPITAL_COMMUNITY): Payer: Medicare Other

## 2012-09-21 LAB — COMPREHENSIVE METABOLIC PANEL
ALT: 29 U/L (ref 0–53)
Alkaline Phosphatase: 49 U/L (ref 39–117)
CO2: 32 mEq/L (ref 19–32)
Calcium: 8.7 mg/dL (ref 8.4–10.5)
Chloride: 101 mEq/L (ref 96–112)
GFR calc Af Amer: >90 mL/min (ref >90)
GFR calc non Af Amer: >90 mL/min (ref >90)
Glucose, Bld: 127 mg/dL — ABNORMAL HIGH (ref 70–99)
Potassium: 3.6 mEq/L (ref 3.5–5.1)
Sodium: 137 mEq/L (ref 135–145)
Total Bilirubin: 0.8 mg/dL (ref 0.3–1.2)

## 2012-09-21 LAB — CBC
Hemoglobin: 14.3 g/dL (ref 13.0–17.0)
MCH: 34 pg (ref 26.0–34.0)
RBC: 4.21 MIL/uL — ABNORMAL LOW (ref 4.22–5.81)
WBC: 14.6 10*3/uL — ABNORMAL HIGH (ref 4.0–10.5)

## 2012-09-21 LAB — CREATININE, FLUID (PLEURAL, PERITONEAL, JP DRAINAGE): Creat, Fluid: 0.8 mg/dL

## 2012-09-21 MED ORDER — ONDANSETRON HCL 4 MG/2ML IJ SOLN
4.0000 mg | INTRAMUSCULAR | Status: DC | PRN
  Administered 2012-09-21 (×3): 4 mg via INTRAVENOUS
  Filled 2012-09-21 (×3): qty 2

## 2012-09-21 MED ORDER — BISACODYL 10 MG RE SUPP
10.0000 mg | Freq: Once | RECTAL | Status: AC
  Administered 2012-09-21: 09:00:00 10 mg via RECTAL
  Filled 2012-09-21: qty 1

## 2012-09-21 NOTE — Progress Notes (Signed)
Patient ID: [name] [name], male   DOB: 10-23-1940, 72 y.o.   MRN: [id number]  Nurse called - pt with some abd pain and distention, small volume emesis, increased JP drainage. I'll send for CBC, BMP, JP creatinine and acute abd series.

## 2012-09-21 NOTE — Progress Notes (Addendum)
2 Days Post-Op Subjective: Patient reports abdominal cramping and discomfort. No flatus yet.  Objective: Vital signs in last 24 hours: Temp:  [98.4 F (36.9 C)-99 F (37.2 C)] 98.5 F (36.9 C) (08/31 0550) Pulse Rate:  [84-102] 102 (08/31 0550) Resp:  [18-20] 18 (08/31 0550) BP: (117-173)/(62-97) 120/83 mmHg (08/31 0758) SpO2:  [91 %-94 %] 91 % (08/31 0550)  Intake/Output from previous day: 08/30 0701 - 08/31 0700 In: 3200 [P.O.:1200; I.V.:2000] Out: 832 [Urine:625; Drains:207] Intake/Output this shift:    Physical Exam:  Constitutional: Vital signs reviewed. WD WN in NAD   Eyes: PERRL, No scleral icterus.   Cardiovascular: RRR Pulmonary/Chest: Normal effort Abdominal: Moderately distended. Dressings are dry. Bowel sounds are decreased. No rebound or guarding.  Lab Results:  Recent Labs  09/20/12 0530 09/20/12 1642 09/21/12 0749  HGB 13.1 13.8 14.3  HCT 38.2* 40.0 40.8   BMET  Recent Labs  09/20/12 0530  NA 139  K 3.8  CL 106  CO2 27  GLUCOSE 120*  BUN 15  CREATININE 0.91  CALCIUM 8.6   No results found for this basename: LABPT, INR,  in the last 72 hours No results found for this basename: LABURIN,  in the last 72 hours Results for orders placed during the hospital encounter of 03/30/08  URINE CULTURE     Status: None   Collection Time    03/25/08 11:10 AM      Result Value Range Status   Specimen Description URINE, CLEAN CATCH   Final   Special Requests IMMUNE:NORM UT SYMPT:NEG   Final   Colony Count NO GROWTH   Final   Culture NO GROWTH   Final   Report Status 03/26/2008 FINAL   Final   Blake drain fluid creatinine is pending   Studies/Results: No results found.  Assessment/Plan:    postop day #2 combined cholecystectomy and partial nephrectomy. He has developed an ileus. Hemoglobin is acceptable, drain fluid creatinine is pending, as is abdominal series.    I have ordered a Dulcolax suppository, and I think ambulation will help his  ileus as well.     I have discontinued his telemetry.   LOS: 2 days   Marcine Matar M 09/21/2012, 8:21 AM

## 2012-09-21 NOTE — Progress Notes (Signed)
Patient has had increased pain throughout the night, abdominal distention and increased drainage and output from RLQ JP. Patient has also has nausea with one small episode of emesis. BP and pulse slightly elevated but other vital signs stable. Bowel sounds audible in all four quadrants. Dr. Mena Goes,  Urologist on call, notified and new orders placed for labs, JP creatinine, and abdominal series. Patient's general surgeon, Dr. Maisie Fus, also notified. Will continue to monitor.

## 2012-09-21 NOTE — Progress Notes (Signed)
2 Days Post-Op robotic chole and and umbilical hernia repair Subjective: Having nausea overnight, emesis this am.  Denies abd pain except for incisional pain.    Objective: Vital signs in last 24 hours: Temp:  [98.4 F (36.9 C)-99 F (37.2 C)] 98.5 F (36.9 C) (08/31 0550) Pulse Rate:  [84-102] 102 (08/31 0550) Resp:  [18-20] 18 (08/31 0550) BP: (117-173)/(62-97) 173/97 mmHg (08/31 0550) SpO2:  [91 %-94 %] 91 % (08/31 0550)   Intake/Output from previous day: 08/30 0701 - 08/31 0700 In: 3200 [P.O.:1200; I.V.:2000] Out: 832 [Urine:625; Drains:207] Intake/Output this shift:     General appearance: alert and cooperative GI: soft, appropriately tender, distended  Incision: no significant drainage JP:  SS fluid  Lab Results:   Recent Labs  09/20/12 0530 09/20/12 1642  HGB 13.1 13.8  HCT 38.2* 40.0   BMET  Recent Labs  09/20/12 0530  NA 139  K 3.8  CL 106  CO2 27  GLUCOSE 120*  BUN 15  CREATININE 0.91  CALCIUM 8.6   PT/INR No results found for this basename: LABPROT, INR,  in the last 72 hours ABG No results found for this basename: PHART, PCO2, PO2, HCO3,  in the last 72 hours  MEDS, Scheduled . amLODipine  5 mg Oral q morning - 10a  . carvedilol  25 mg Oral BID WC  . divalproex  250 mg Oral QHS  . losartan  100 mg Oral q morning - 10a  . senna-docusate  1 tablet Oral BID    Studies/Results: No results found.  Assessment: s/p Procedure(s): ROBOTIC ASSITED PARTIAL NEPHRECTOMY ROBOTIC ASSISTED LAPAROSCOPIC CHOLECYSTECTOMY WITH UMBILICAL HERNIA REPAIR Patient Active Problem List   Diagnosis Date Noted  . Gallstones 08/07/2012      Plan: Appears to have post op ileus Will check cbc, chemistries Will make NPO until bowel function returns  LOS: 2 days     .Vanita Panda, MD [facility], [place] [phone]   09/21/2012 7:37 AM

## 2012-09-22 LAB — CBC
MCHC: 34.2 g/dL (ref 30.0–36.0)
RDW: 12.5 % (ref 11.5–15.5)

## 2012-09-22 NOTE — Progress Notes (Signed)
3 Days Post-Op Subjective: Patient reports that he is feeling better. He had multiple episodes of flatus last night. He is taking clear liquids. He still has a bit of difficulty swallowing because of abdominal distention.  Objective: Vital signs in last 24 hours: Temp:  [98.4 F (36.9 C)-98.7 F (37.1 C)] 98.7 F (37.1 C) (09/01 0605) Pulse Rate:  [77-90] 77 (09/01 0605) Resp:  [18-20] 18 (09/01 0605) BP: (119-151)/(75-92) 128/75 mmHg (09/01 0605) SpO2:  [94 %-97 %] 97 % (09/01 0605)  Intake/Output from previous day: 08/31 0701 - 09/01 0700 In: 2875 [I.V.:2875] Out: 670 [Urine:125; Drains:545] Intake/Output this shift:    Physical Exam:  Constitutional: Vital signs reviewed. WD WN in NAD   Eyes: PERRL, No scleral icterus.   Cardiovascular: RRR Pulmonary/Chest: Normal effort Abdominal: . Incisional sites all look okay. No significant erythema. Bowel sounds slightly improved from yesterday. Still slight tympany. Genitourinary: Extremities: No cyanosis or edema   Lab Results:  Recent Labs  09/20/12 1642 09/21/12 0749 09/22/12 0405  HGB 13.8 14.3 12.6*  HCT 40.0 40.8 36.8*   BMET  Recent Labs  09/20/12 0530 09/21/12 0749  NA 139 137  K 3.8 3.6  CL 106 101  CO2 27 32  GLUCOSE 120* 127*  BUN 15 14  CREATININE 0.91 0.76  CALCIUM 8.6 8.7   No results found for this basename: LABPT, INR,  in the last 72 hours No results found for this basename: LABURIN,  in the last 72 hours Results for orders placed during the hospital encounter of 03/30/08  URINE CULTURE     Status: None   Collection Time    03/25/08 11:10 AM      Result Value Range Status   Specimen Description URINE, CLEAN CATCH   Final   Special Requests IMMUNE:NORM UT SYMPT:NEG   Final   Colony Count NO GROWTH   Final   Culture NO GROWTH   Final   Report Status 03/26/2008 FINAL   Final    Studies/Results: Dg Abd Acute W/chest  09/21/2012   *RADIOLOGY REPORT*  Clinical Data: Abdominal pain  postoperatively  ACUTE ABDOMEN SERIES (ABDOMEN 2 VIEW & CHEST 1 VIEW)  Comparison: None.  Findings: Cardiac shadow is stable.  The lungs are poorly aerated with bibasilar atelectasis.  Considerable free air is noted which may be related to the recent surgery.  Clinical correlation is recommended.  Multiple dilated loops of small bowel are identified. This may represent a postoperative ileus.  A surgical drain is noted on the right.  IMPRESSION: Bibasilar atelectasis.  Significant free intraperitoneal air.  This is likely related to the postoperative state clinical correlation is recommended.  Small bowel dilatation likely representing a postoperative ileus.   Original Report Authenticated By: Alcide Clever, M.D.    Assessment/Plan:   Postoperative day #3 partial right nephrectomy, laparoscopic cholecystectomy. He has a resolving ileus. Abdominal drainage yesterday consistent with serum.     advance diet, will remove drain, continue to ambulate.   LOS: 3 days   Marcine Matar M 09/22/2012, 8:10 AM

## 2012-09-22 NOTE — Progress Notes (Signed)
3 Days Post-Op robotic chole and and umbilical hernia repair, R partial nephrectomy Subjective: Had several BM's overnight.  Feeling better today.  No nausea.  Objective: Vital signs in last 24 hours: Temp:  [98.4 F (36.9 C)-98.7 F (37.1 C)] 98.7 F (37.1 C) (09/01 0605) Pulse Rate:  [77-90] 77 (09/01 0605) Resp:  [18-20] 18 (09/01 0605) BP: (119-151)/(75-92) 128/75 mmHg (09/01 0605) SpO2:  [94 %-97 %] 97 % (09/01 0605)   Intake/Output from previous day: 08/31 0701 - 09/01 0700 In: 2875 [I.V.:2875] Out: 670 [Urine:125; Drains:545] Intake/Output this shift:     General appearance: alert and cooperative GI: soft, appropriately tender, distended  Incision: no significant drainage JP:  SS fluid  Lab Results:   Recent Labs  09/21/12 0749 09/22/12 0405  WBC 14.6* 8.1  HGB 14.3 12.6*  HCT 40.8 36.8*  PLT 156 127*   BMET  Recent Labs  09/20/12 0530 09/21/12 0749  NA 139 137  K 3.8 3.6  CL 106 101  CO2 27 32  GLUCOSE 120* 127*  BUN 15 14  CREATININE 0.91 0.76  CALCIUM 8.6 8.7   PT/INR No results found for this basename: LABPROT, INR,  in the last 72 hours ABG No results found for this basename: PHART, PCO2, PO2, HCO3,  in the last 72 hours  MEDS, Scheduled . amLODipine  5 mg Oral q morning - 10a  . carvedilol  25 mg Oral BID WC  . divalproex  250 mg Oral QHS  . losartan  100 mg Oral q morning - 10a  . senna-docusate  1 tablet Oral BID    Studies/Results: Dg Abd Acute W/chest  09/21/2012   *RADIOLOGY REPORT*  Clinical Data: Abdominal pain postoperatively  ACUTE ABDOMEN SERIES (ABDOMEN 2 VIEW & CHEST 1 VIEW)  Comparison: None.  Findings: Cardiac shadow is stable.  The lungs are poorly aerated with bibasilar atelectasis.  Considerable free air is noted which may be related to the recent surgery.  Clinical correlation is recommended.  Multiple dilated loops of small bowel are identified. This may represent a postoperative ileus.  A surgical drain is noted  on the right.  IMPRESSION: Bibasilar atelectasis.  Significant free intraperitoneal air.  This is likely related to the postoperative state clinical correlation is recommended.  Small bowel dilatation likely representing a postoperative ileus.   Original Report Authenticated By: Alcide Clever, M.D.    Assessment: s/p Procedure(s): ROBOTIC ASSITED PARTIAL NEPHRECTOMY ROBOTIC ASSISTED LAPAROSCOPIC CHOLECYSTECTOMY WITH UMBILICAL HERNIA REPAIR Patient Active Problem List   Diagnosis Date Noted  . Gallstones 08/07/2012      Plan: Appears to have resolving post op ileus Will place on clears and advance diet as tolerated.   Ok to d/c once tolerating a diet from my standpoint   LOS: 3 days     .Vanita Panda, MD [facility], [place] [phone]   09/22/2012 7:53 AM

## 2012-09-23 ENCOUNTER — Encounter (HOSPITAL_COMMUNITY): Payer: Self-pay | Admitting: Urology

## 2012-09-23 NOTE — Progress Notes (Signed)
Patient ID: [name] [name], male   DOB: [birth year], 72 y.o.   MRN: [id number] 4 Days Post-Op Subjective: The patient is doing well.  No nausea or vomiting. Pain is adequately controlled.  He is tolerating a regular diet and ambulating without difficulty.  He is voiding.  Passing flatus and had several BMs yesterday.  Objective: Vital signs in last 24 hours: Temp:  [98 F (36.7 C)-98.7 F (37.1 C)] 98 F (36.7 C) (09/02 0433) Pulse Rate:  [65-77] 65 (09/02 0433) Resp:  [18-19] 19 (09/02 0433) BP: (115-139)/(60-78) 139/78 mmHg (09/02 0433) SpO2:  [95 %-97 %] 97 % (09/02 0433)  Intake/Output from previous day: 09/01 0701 - 09/02 0700 In: 3041.3 [P.O.:960; I.V.:2081.3] Out: 725 [Urine:725] Intake/Output this shift:    Physical Exam:  General: Alert and oriented. CV: RRR Lungs: Clear bilaterally. GI: Soft, Nondistended; +BS Incisions: Clean and dry. Extremities: Nontender, no erythema, no edema.  Lab Results:  Recent Labs  09/20/12 1642 09/21/12 0749 09/22/12 0405  HGB 13.8 14.3 12.6*  HCT 40.0 40.8 36.8*          Recent Labs  09/20/12 0530 09/21/12 0749  CREATININE 0.91 0.76           Assessment/Plan: POD# 4 s/p robotic partial nephrectomy.  Doing well.  D/C IV and D/C home    LOS: 4 days   YARBROUGH,Madyson Lukach G. 09/23/2012, 10:25 AM

## 2012-09-23 NOTE — Progress Notes (Signed)
Assumed care of patient. No change in initial PM assessment. Cont with plan of care 

## 2012-09-25 NOTE — Discharge Summary (Signed)
Date of admission: 09/19/2012  Date of discharge: 09/23/12  Admission diagnosis: right renal mass; symptomatic cholelithiasis; umbilical hernia  Discharge diagnosis: same  Secondary diagnoses: First degree AVB, prostate cancer, HTN, hyperlipidemia, MI, CAD, cerebral vascular malformation, gallstones, arthritis  History and Physical: For full details, please see admission history and physical. Briefly, [name] [name] is a 72 y.o. year old male patient who  Presented for robotic-assisted right partial nephrectomy due to an incidentally discovered enhancing renal mass. He also has an umbilical hernia and symptomatic cholelithiasis. He'll therefore also have a laparoscopic cholecystectomy and an umbilical hernia repair by Dr. Maisie Fus.   Hospital Course: Pt was admitted and taken to the OR on 09/19/12 for robotic assisted laparoscopic right partial nephrectomy, cholecystectomy, and umbilical hernia repair.  Pt tolerated the procedure well and was hemodynamically stable immediately post op.  He was extubated without complication and woke up from anesthesia neurologically intact.  He was transferred from the PACU to the floor without difficulty.  His vitals remained stable post op.  Due to his AVB aspirin was restarted on POD 2 per cardiology instructions.  His post op course was complicated by an ileus.  This resolved by POD 3 with bowel rest, suppositories, and ambulation.  By POD 3 he was passing flatus and had had several BMs.  His diet was advanced accordingly and he was able to tolerate regular foods by POD 4.  He was voiding and ambulating without difficulty.  JP fluid Cr was consistent with serum and this was d/c'd on POD 3.  Pt was doing well and felt stable for d/c home on POD 4.  Laboratory values:  Lab Results  Component Value Date   WBC 8.1 09/22/2012   HGB 12.6* 09/22/2012   HCT 36.8* 09/22/2012   MCV 98.9 09/22/2012   PLT 127* 09/22/2012   Lab Results  Component Value Date   CREATININE 0.76  09/21/2012    Disposition: Home  Discharge instruction: The patient was instructed to be ambulatory but told to refrain from heavy lifting, strenuous activity, or driving.  He was also encouraged to follow a low fat diet.   He was instructed to restart Plavix 5 days post op.   Discharge medications:    Medication List    STOP taking these medications       clopidogrel 75 MG tablet  Commonly known as:  PLAVIX      TAKE these medications       amLODipine 5 MG tablet  Commonly known as:  NORVASC  Take 5 mg by mouth every morning.     aspirin 81 MG tablet  Take 81 mg by mouth daily.     carvedilol 25 MG tablet  Commonly known as:  COREG  Take 25 mg by mouth 2 (two) times daily with a meal.     divalproex 250 MG 24 hr tablet  Commonly known as:  DEPAKOTE ER  Take 250 mg by mouth at bedtime.     losartan 100 MG tablet  Commonly known as:  COZAAR  Take 100 mg by mouth every morning.     oxyCODONE-acetaminophen 5-325 MG per tablet  Commonly known as:  PERCOCET  Take 1-2 tablets by mouth every 4 (four) hours as needed for pain.     senna-docusate 8.6-50 MG per tablet  Commonly known as:  SENOKOT S  Take 1 tablet by mouth 2 (two) times daily.        Followup:      Follow-up Information  Follow up with Milford Cage, MD On 09/30/2012. (10:45 am)    Specialty:  Urology   Contact information:   [address] [place] [address] [place] [address] [phone]       Follow up with Vanita Panda., MD. Schedule an appointment as soon as possible for a visit in 3 weeks.   Specialty:  General Surgery   Contact information:   [address] [place] [place] [address] [phone]

## 2012-10-22 ENCOUNTER — Encounter (INDEPENDENT_AMBULATORY_CARE_PROVIDER_SITE_OTHER): Payer: Self-pay | Admitting: General Surgery

## 2012-10-22 ENCOUNTER — Ambulatory Visit (INDEPENDENT_AMBULATORY_CARE_PROVIDER_SITE_OTHER): Payer: Medicare Other | Admitting: General Surgery

## 2012-10-22 VITALS — BP 118/80 | HR 68 | Resp 16 | Ht 75.0 in | Wt 196.6 lb

## 2012-10-22 DIAGNOSIS — Z9889 Other specified postprocedural states: Secondary | ICD-10-CM

## 2012-10-22 NOTE — Progress Notes (Signed)
[name] [name] is a 72 y.o. male who is status post a robotic cholecystectomy on 8/29.  He developed a bit of a post op ileus that resolved within a couple days.  His appetite is close to getting back to normal.  He did have a small episode of rectal bleeding with some post op constipation but hasn't noticed anything since then.  Objective: Filed Vitals:   10/22/12 1627  BP: 118/80  Pulse: 68  Resp: 16    General appearance: alert and cooperative GI: normal findings: soft, non-tender  Incision: healing well, no hernia palpated   Assessment: s/p  There are no active problems to display for this patient.   Plan: Doing well.  Pt will call if he doesn't continue to feel better or develops more rectal bleeding    .Vanita Panda, MD [facility], [place] [phone]   10/22/2012 5:01 PM

## 2012-10-22 NOTE — Patient Instructions (Signed)
Call me if your symptoms get worse or you have another episode rectal bleeding.

## 2012-10-29 ENCOUNTER — Encounter (INDEPENDENT_AMBULATORY_CARE_PROVIDER_SITE_OTHER): Payer: Medicare Other | Admitting: General Surgery

## 2013-06-26 ENCOUNTER — Ambulatory Visit (HOSPITAL_COMMUNITY)
Admission: RE | Admit: 2013-06-26 | Discharge: 2013-06-26 | Disposition: A | Discharge status: Home / Self Care | Payer: Medicare Other | Source: Ambulatory Visit | Attending: Urology | Admitting: Urology

## 2013-06-26 ENCOUNTER — Other Ambulatory Visit (HOSPITAL_COMMUNITY): Payer: Self-pay | Admitting: Urology

## 2013-06-26 DIAGNOSIS — Z85528 Personal history of other malignant neoplasm of kidney: Secondary | ICD-10-CM

## 2013-06-26 DIAGNOSIS — I10 Essential (primary) hypertension: Secondary | ICD-10-CM | POA: Insufficient documentation

## 2013-07-08 HISTORY — PX: COLONOSCOPY: SHX174

## 2013-09-30 HISTORY — PX: FLEXIBLE SIGMOIDOSCOPY: SHX1649

## 2015-02-03 DIAGNOSIS — Z139 Encounter for screening, unspecified: Secondary | ICD-10-CM | POA: Diagnosis not present

## 2015-02-03 DIAGNOSIS — Z6826 Body mass index (BMI) 26.0-26.9, adult: Secondary | ICD-10-CM | POA: Diagnosis not present

## 2015-02-03 DIAGNOSIS — E782 Mixed hyperlipidemia: Secondary | ICD-10-CM | POA: Diagnosis not present

## 2015-02-03 DIAGNOSIS — Z1389 Encounter for screening for other disorder: Secondary | ICD-10-CM | POA: Diagnosis not present

## 2015-02-03 DIAGNOSIS — I10 Essential (primary) hypertension: Secondary | ICD-10-CM | POA: Diagnosis not present

## 2015-02-03 DIAGNOSIS — Z79899 Other long term (current) drug therapy: Secondary | ICD-10-CM | POA: Diagnosis not present

## 2015-02-03 DIAGNOSIS — N289 Disorder of kidney and ureter, unspecified: Secondary | ICD-10-CM | POA: Diagnosis not present

## 2015-02-22 DIAGNOSIS — H16049 Marginal corneal ulcer, unspecified eye: Secondary | ICD-10-CM | POA: Diagnosis not present

## 2015-03-27 DIAGNOSIS — S0501XA Injury of conjunctiva and corneal abrasion without foreign body, right eye, initial encounter: Secondary | ICD-10-CM | POA: Diagnosis not present

## 2015-08-17 DIAGNOSIS — Z6826 Body mass index (BMI) 26.0-26.9, adult: Secondary | ICD-10-CM | POA: Diagnosis not present

## 2015-08-17 DIAGNOSIS — Z79899 Other long term (current) drug therapy: Secondary | ICD-10-CM | POA: Diagnosis not present

## 2015-08-17 DIAGNOSIS — I10 Essential (primary) hypertension: Secondary | ICD-10-CM | POA: Diagnosis not present

## 2015-08-17 DIAGNOSIS — Z9181 History of falling: Secondary | ICD-10-CM | POA: Diagnosis not present

## 2015-08-17 DIAGNOSIS — E782 Mixed hyperlipidemia: Secondary | ICD-10-CM | POA: Diagnosis not present

## 2015-09-21 DIAGNOSIS — H524 Presbyopia: Secondary | ICD-10-CM | POA: Diagnosis not present

## 2015-09-21 DIAGNOSIS — H2513 Age-related nuclear cataract, bilateral: Secondary | ICD-10-CM | POA: Diagnosis not present

## 2015-09-21 DIAGNOSIS — D3132 Benign neoplasm of left choroid: Secondary | ICD-10-CM | POA: Diagnosis not present

## 2015-12-25 DIAGNOSIS — J019 Acute sinusitis, unspecified: Secondary | ICD-10-CM | POA: Diagnosis not present

## 2016-01-03 ENCOUNTER — Ambulatory Visit (HOSPITAL_COMMUNITY)
Admission: RE | Admit: 2016-01-03 | Discharge: 2016-01-03 | Disposition: A | Discharge status: Home / Self Care | Payer: PPO | Source: Ambulatory Visit | Attending: Urology | Admitting: Urology

## 2016-01-03 ENCOUNTER — Other Ambulatory Visit: Payer: Self-pay | Admitting: Urology

## 2016-01-03 DIAGNOSIS — R918 Other nonspecific abnormal finding of lung field: Secondary | ICD-10-CM | POA: Insufficient documentation

## 2016-01-03 DIAGNOSIS — C61 Malignant neoplasm of prostate: Secondary | ICD-10-CM | POA: Diagnosis not present

## 2016-01-03 DIAGNOSIS — Z8546 Personal history of malignant neoplasm of prostate: Secondary | ICD-10-CM | POA: Diagnosis not present

## 2016-01-03 DIAGNOSIS — C641 Malignant neoplasm of right kidney, except renal pelvis: Secondary | ICD-10-CM | POA: Diagnosis not present

## 2016-01-10 DIAGNOSIS — R3915 Urgency of urination: Secondary | ICD-10-CM | POA: Diagnosis not present

## 2016-01-10 DIAGNOSIS — N401 Enlarged prostate with lower urinary tract symptoms: Secondary | ICD-10-CM | POA: Diagnosis not present

## 2016-01-10 DIAGNOSIS — C61 Malignant neoplasm of prostate: Secondary | ICD-10-CM | POA: Diagnosis not present

## 2016-01-10 DIAGNOSIS — C641 Malignant neoplasm of right kidney, except renal pelvis: Secondary | ICD-10-CM | POA: Diagnosis not present

## 2016-02-13 DIAGNOSIS — J189 Pneumonia, unspecified organism: Secondary | ICD-10-CM | POA: Diagnosis not present

## 2016-02-13 DIAGNOSIS — R062 Wheezing: Secondary | ICD-10-CM | POA: Diagnosis not present

## 2016-02-13 DIAGNOSIS — R0602 Shortness of breath: Secondary | ICD-10-CM | POA: Diagnosis not present

## 2016-02-20 DIAGNOSIS — Z79899 Other long term (current) drug therapy: Secondary | ICD-10-CM | POA: Diagnosis not present

## 2016-02-20 DIAGNOSIS — Z1389 Encounter for screening for other disorder: Secondary | ICD-10-CM | POA: Diagnosis not present

## 2016-02-20 DIAGNOSIS — E782 Mixed hyperlipidemia: Secondary | ICD-10-CM | POA: Diagnosis not present

## 2016-02-20 DIAGNOSIS — Z6826 Body mass index (BMI) 26.0-26.9, adult: Secondary | ICD-10-CM | POA: Diagnosis not present

## 2016-02-20 DIAGNOSIS — I10 Essential (primary) hypertension: Secondary | ICD-10-CM | POA: Diagnosis not present

## 2016-03-07 DIAGNOSIS — D72829 Elevated white blood cell count, unspecified: Secondary | ICD-10-CM | POA: Diagnosis not present

## 2016-03-09 DIAGNOSIS — R110 Nausea: Secondary | ICD-10-CM | POA: Diagnosis not present

## 2016-03-09 DIAGNOSIS — Z6826 Body mass index (BMI) 26.0-26.9, adult: Secondary | ICD-10-CM | POA: Diagnosis not present

## 2016-03-09 DIAGNOSIS — R531 Weakness: Secondary | ICD-10-CM | POA: Diagnosis not present

## 2016-03-09 DIAGNOSIS — I959 Hypotension, unspecified: Secondary | ICD-10-CM | POA: Diagnosis not present

## 2016-03-14 DIAGNOSIS — Z6826 Body mass index (BMI) 26.0-26.9, adult: Secondary | ICD-10-CM | POA: Diagnosis not present

## 2016-03-14 DIAGNOSIS — Z79899 Other long term (current) drug therapy: Secondary | ICD-10-CM | POA: Diagnosis not present

## 2016-03-14 DIAGNOSIS — I2510 Atherosclerotic heart disease of native coronary artery without angina pectoris: Secondary | ICD-10-CM | POA: Diagnosis not present

## 2016-03-14 DIAGNOSIS — R5383 Other fatigue: Secondary | ICD-10-CM | POA: Diagnosis not present

## 2016-03-14 DIAGNOSIS — I10 Essential (primary) hypertension: Secondary | ICD-10-CM | POA: Diagnosis not present

## 2016-03-14 DIAGNOSIS — K219 Gastro-esophageal reflux disease without esophagitis: Secondary | ICD-10-CM | POA: Diagnosis not present

## 2016-03-15 DIAGNOSIS — I952 Hypotension due to drugs: Secondary | ICD-10-CM | POA: Diagnosis not present

## 2016-03-15 DIAGNOSIS — I10 Essential (primary) hypertension: Secondary | ICD-10-CM | POA: Diagnosis not present

## 2016-03-15 DIAGNOSIS — I25119 Atherosclerotic heart disease of native coronary artery with unspecified angina pectoris: Secondary | ICD-10-CM | POA: Diagnosis not present

## 2016-03-15 DIAGNOSIS — E784 Other hyperlipidemia: Secondary | ICD-10-CM | POA: Diagnosis not present

## 2016-03-21 DIAGNOSIS — I25119 Atherosclerotic heart disease of native coronary artery with unspecified angina pectoris: Secondary | ICD-10-CM | POA: Diagnosis not present

## 2016-04-25 DIAGNOSIS — R0602 Shortness of breath: Secondary | ICD-10-CM | POA: Diagnosis not present

## 2016-04-25 DIAGNOSIS — E784 Other hyperlipidemia: Secondary | ICD-10-CM | POA: Diagnosis not present

## 2016-04-25 DIAGNOSIS — I25119 Atherosclerotic heart disease of native coronary artery with unspecified angina pectoris: Secondary | ICD-10-CM | POA: Diagnosis not present

## 2016-04-25 DIAGNOSIS — I10 Essential (primary) hypertension: Secondary | ICD-10-CM | POA: Diagnosis not present

## 2016-05-03 DIAGNOSIS — R0602 Shortness of breath: Secondary | ICD-10-CM | POA: Diagnosis not present

## 2016-08-20 DIAGNOSIS — I10 Essential (primary) hypertension: Secondary | ICD-10-CM | POA: Diagnosis not present

## 2016-08-20 DIAGNOSIS — Z79899 Other long term (current) drug therapy: Secondary | ICD-10-CM | POA: Diagnosis not present

## 2016-08-20 DIAGNOSIS — I252 Old myocardial infarction: Secondary | ICD-10-CM | POA: Diagnosis not present

## 2016-08-20 DIAGNOSIS — Z6826 Body mass index (BMI) 26.0-26.9, adult: Secondary | ICD-10-CM | POA: Diagnosis not present

## 2016-08-20 DIAGNOSIS — E782 Mixed hyperlipidemia: Secondary | ICD-10-CM | POA: Diagnosis not present

## 2016-08-20 DIAGNOSIS — I2510 Atherosclerotic heart disease of native coronary artery without angina pectoris: Secondary | ICD-10-CM | POA: Diagnosis not present

## 2016-08-20 DIAGNOSIS — Z1211 Encounter for screening for malignant neoplasm of colon: Secondary | ICD-10-CM | POA: Diagnosis not present

## 2016-10-15 DIAGNOSIS — Z1211 Encounter for screening for malignant neoplasm of colon: Secondary | ICD-10-CM | POA: Diagnosis not present

## 2016-10-15 DIAGNOSIS — Z1212 Encounter for screening for malignant neoplasm of rectum: Secondary | ICD-10-CM | POA: Diagnosis not present

## 2017-01-09 DIAGNOSIS — Z1331 Encounter for screening for depression: Secondary | ICD-10-CM | POA: Diagnosis not present

## 2017-01-09 DIAGNOSIS — Z125 Encounter for screening for malignant neoplasm of prostate: Secondary | ICD-10-CM | POA: Diagnosis not present

## 2017-01-09 DIAGNOSIS — Z139 Encounter for screening, unspecified: Secondary | ICD-10-CM | POA: Diagnosis not present

## 2017-01-09 DIAGNOSIS — Z9181 History of falling: Secondary | ICD-10-CM | POA: Diagnosis not present

## 2017-01-09 DIAGNOSIS — Z0000 Encounter for general adult medical examination without abnormal findings: Secondary | ICD-10-CM | POA: Diagnosis not present

## 2017-01-09 DIAGNOSIS — E785 Hyperlipidemia, unspecified: Secondary | ICD-10-CM | POA: Diagnosis not present

## 2017-01-28 DIAGNOSIS — H2513 Age-related nuclear cataract, bilateral: Secondary | ICD-10-CM | POA: Diagnosis not present

## 2017-01-28 DIAGNOSIS — D3132 Benign neoplasm of left choroid: Secondary | ICD-10-CM | POA: Diagnosis not present

## 2017-01-28 DIAGNOSIS — H52223 Regular astigmatism, bilateral: Secondary | ICD-10-CM | POA: Diagnosis not present

## 2017-02-20 DIAGNOSIS — Z6826 Body mass index (BMI) 26.0-26.9, adult: Secondary | ICD-10-CM | POA: Diagnosis not present

## 2017-02-20 DIAGNOSIS — Z79899 Other long term (current) drug therapy: Secondary | ICD-10-CM | POA: Diagnosis not present

## 2017-02-20 DIAGNOSIS — E782 Mixed hyperlipidemia: Secondary | ICD-10-CM | POA: Diagnosis not present

## 2017-02-20 DIAGNOSIS — I252 Old myocardial infarction: Secondary | ICD-10-CM | POA: Diagnosis not present

## 2017-02-20 DIAGNOSIS — I10 Essential (primary) hypertension: Secondary | ICD-10-CM | POA: Diagnosis not present

## 2017-02-20 DIAGNOSIS — I2510 Atherosclerotic heart disease of native coronary artery without angina pectoris: Secondary | ICD-10-CM | POA: Diagnosis not present

## 2017-02-26 IMAGING — CR DG CHEST 2V
2 series · 2 of 2 positions shown · non-contrast
Comparison: Chest CT 12/22/2013.  Chest x-ray 06/26/2013

CLINICAL DATA: Lung mass, prostate cancer.

EXAM:
CHEST  2 VIEW

[w chest pa]
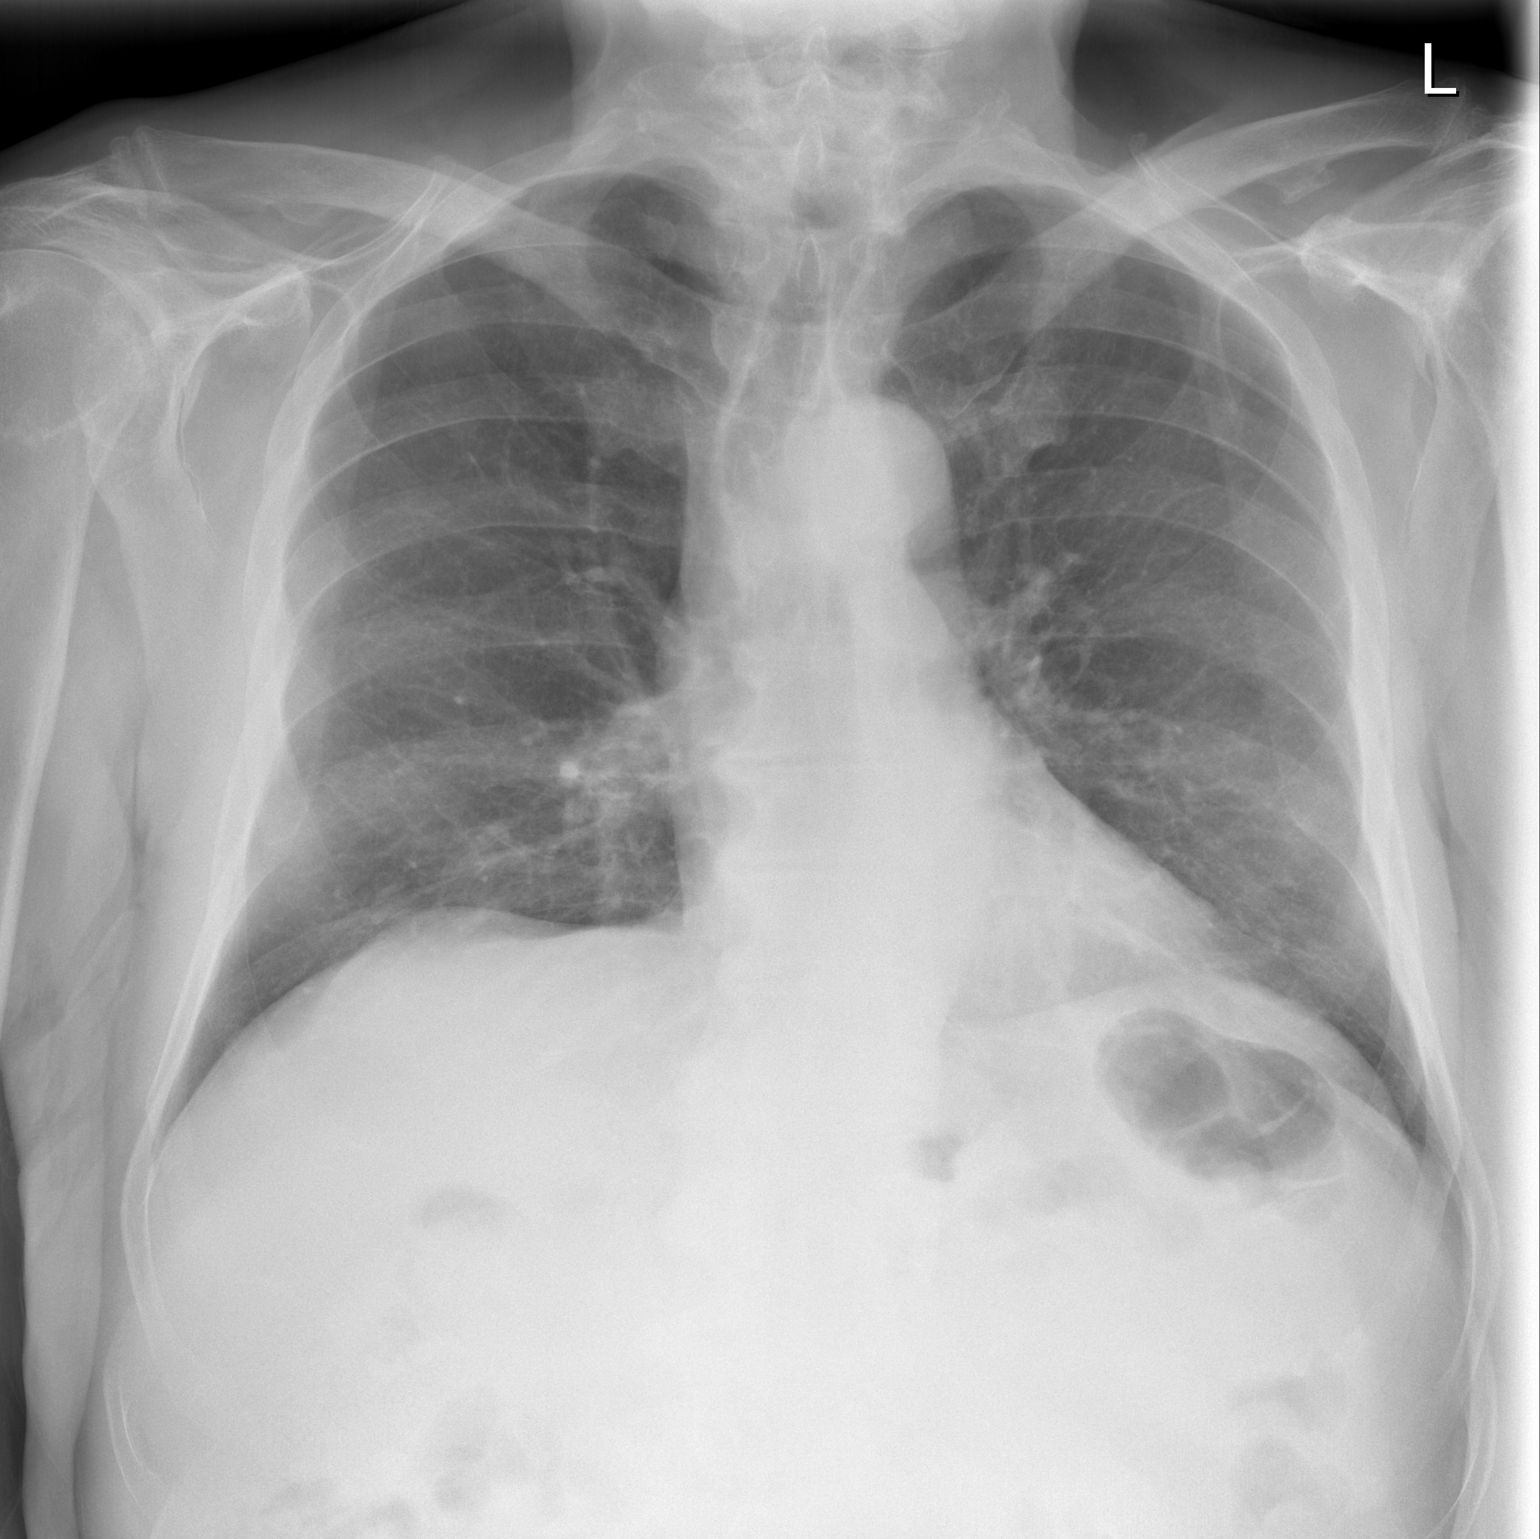

[w chest lat]
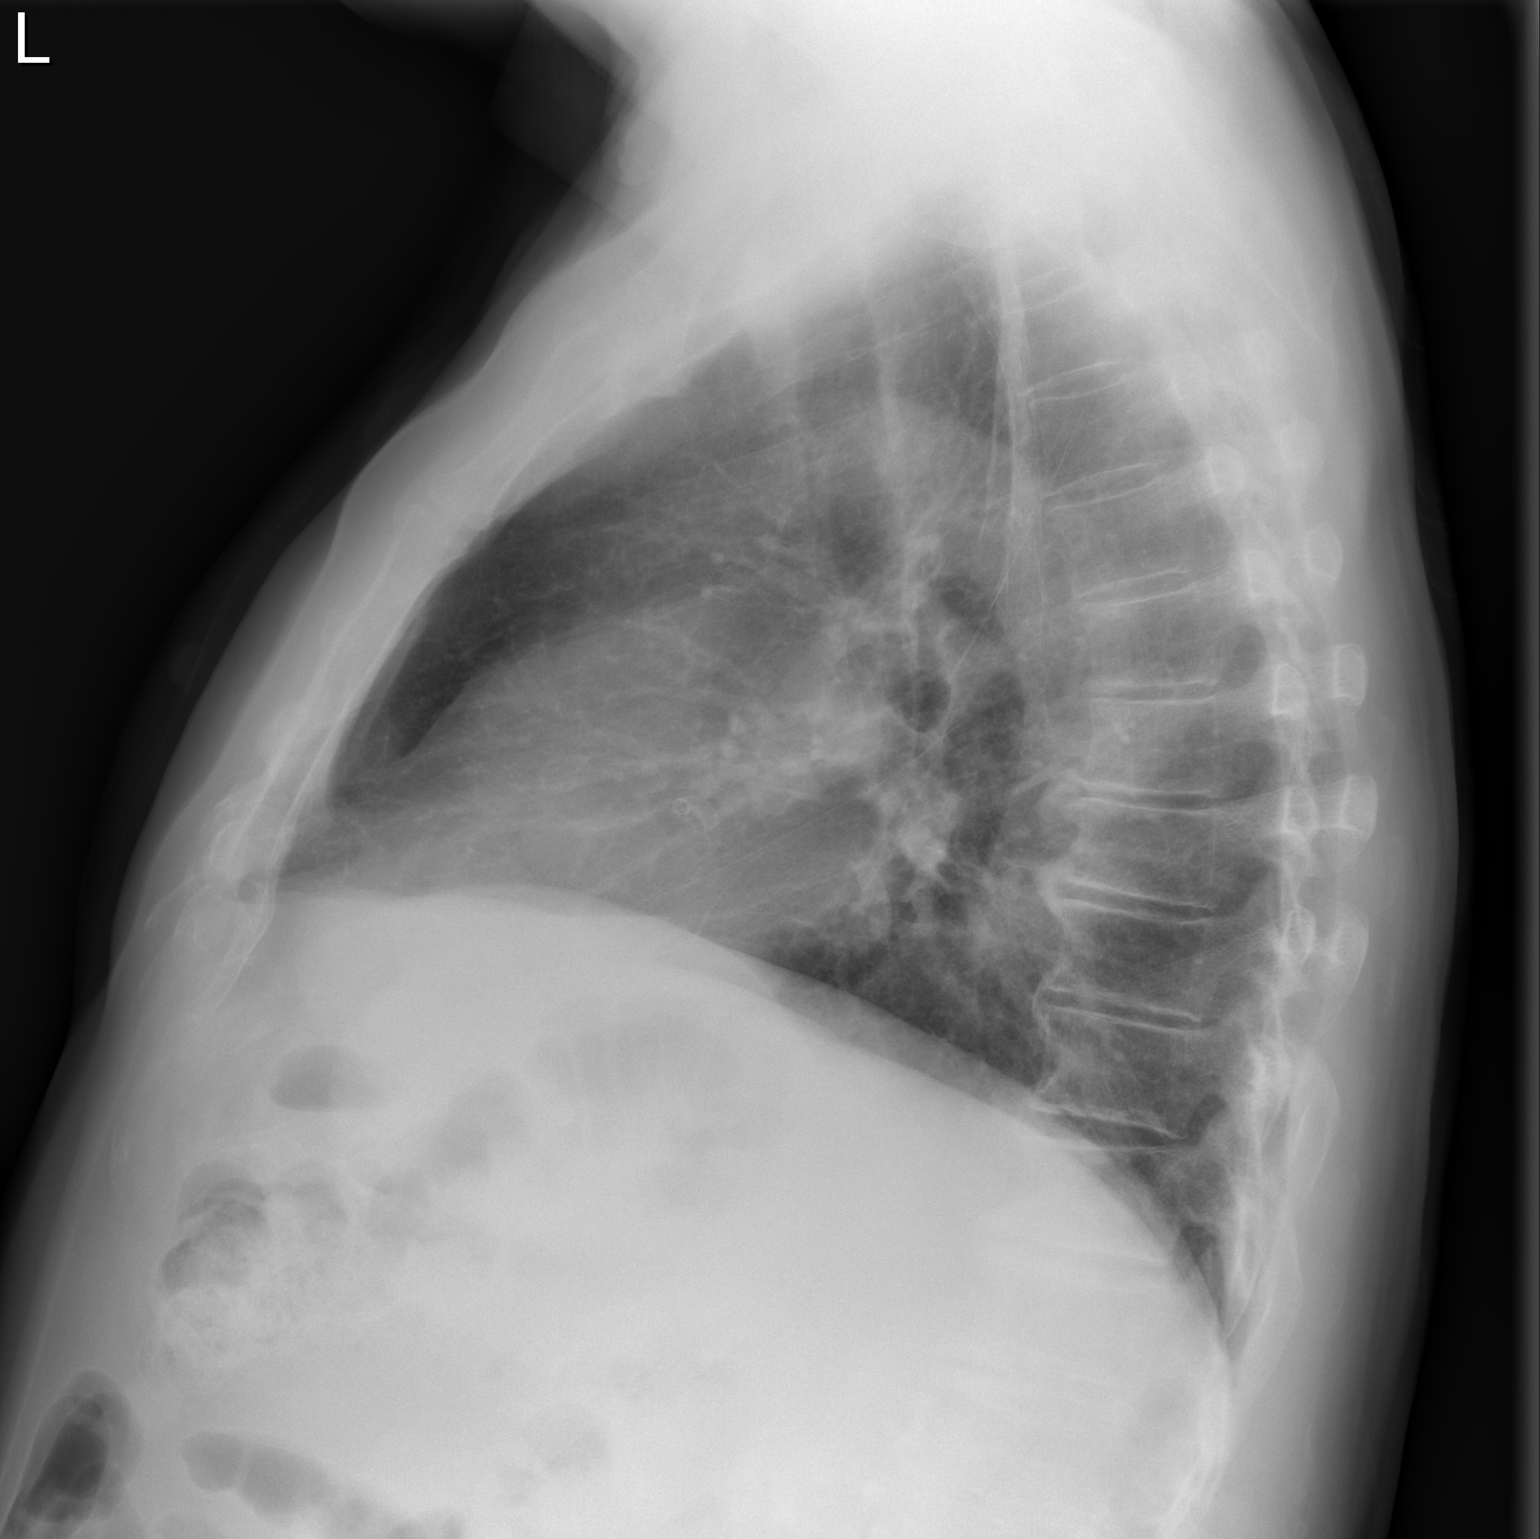

[2 of 2 positions shown; findings below may reference images not displayed]

FINDINGS: Heart and mediastinal contours are within normal limits. No focal
opacities or effusions. No acute bony abnormality.
IMPRESSION: No active cardiopulmonary disease.

## 2017-03-04 ENCOUNTER — Other Ambulatory Visit: Payer: Self-pay

## 2017-03-04 ENCOUNTER — Inpatient Hospital Stay (HOSPITAL_COMMUNITY)
Admission: EM | Disposition: A | Discharge status: Home / Self Care | Payer: Self-pay | Source: Home / Self Care | Attending: Cardiovascular Disease

## 2017-03-04 ENCOUNTER — Encounter (HOSPITAL_COMMUNITY): Payer: Self-pay

## 2017-03-04 ENCOUNTER — Inpatient Hospital Stay (HOSPITAL_COMMUNITY)
Admission: EM | Admit: 2017-03-04 | Discharge: 2017-03-06 | DRG: 247 | Disposition: A | Discharge status: Home / Self Care | Payer: PPO | Attending: Cardiovascular Disease | Admitting: Cardiovascular Disease

## 2017-03-04 ENCOUNTER — Emergency Department (HOSPITAL_COMMUNITY): Payer: PPO

## 2017-03-04 DIAGNOSIS — Z96652 Presence of left artificial knee joint: Secondary | ICD-10-CM | POA: Diagnosis not present

## 2017-03-04 DIAGNOSIS — I25110 Atherosclerotic heart disease of native coronary artery with unstable angina pectoris: Secondary | ICD-10-CM | POA: Diagnosis not present

## 2017-03-04 DIAGNOSIS — Z7902 Long term (current) use of antithrombotics/antiplatelets: Secondary | ICD-10-CM | POA: Diagnosis not present

## 2017-03-04 DIAGNOSIS — Z87891 Personal history of nicotine dependence: Secondary | ICD-10-CM | POA: Diagnosis not present

## 2017-03-04 DIAGNOSIS — I214 Non-ST elevation (NSTEMI) myocardial infarction: Secondary | ICD-10-CM | POA: Diagnosis present

## 2017-03-04 DIAGNOSIS — R790 Abnormal level of blood mineral: Secondary | ICD-10-CM | POA: Diagnosis not present

## 2017-03-04 DIAGNOSIS — R079 Chest pain, unspecified: Secondary | ICD-10-CM | POA: Diagnosis not present

## 2017-03-04 DIAGNOSIS — Z888 Allergy status to other drugs, medicaments and biological substances status: Secondary | ICD-10-CM

## 2017-03-04 DIAGNOSIS — R011 Cardiac murmur, unspecified: Secondary | ICD-10-CM | POA: Diagnosis not present

## 2017-03-04 DIAGNOSIS — Z905 Acquired absence of kidney: Secondary | ICD-10-CM

## 2017-03-04 DIAGNOSIS — I252 Old myocardial infarction: Secondary | ICD-10-CM | POA: Diagnosis not present

## 2017-03-04 DIAGNOSIS — Z955 Presence of coronary angioplasty implant and graft: Secondary | ICD-10-CM

## 2017-03-04 DIAGNOSIS — E785 Hyperlipidemia, unspecified: Secondary | ICD-10-CM | POA: Diagnosis not present

## 2017-03-04 DIAGNOSIS — Z8249 Family history of ischemic heart disease and other diseases of the circulatory system: Secondary | ICD-10-CM | POA: Diagnosis not present

## 2017-03-04 DIAGNOSIS — R0789 Other chest pain: Secondary | ICD-10-CM | POA: Diagnosis present

## 2017-03-04 DIAGNOSIS — E876 Hypokalemia: Secondary | ICD-10-CM | POA: Diagnosis present

## 2017-03-04 DIAGNOSIS — Z96641 Presence of right artificial hip joint: Secondary | ICD-10-CM | POA: Diagnosis not present

## 2017-03-04 DIAGNOSIS — Z9049 Acquired absence of other specified parts of digestive tract: Secondary | ICD-10-CM

## 2017-03-04 DIAGNOSIS — I10 Essential (primary) hypertension: Secondary | ICD-10-CM | POA: Diagnosis not present

## 2017-03-04 DIAGNOSIS — I493 Ventricular premature depolarization: Secondary | ICD-10-CM | POA: Diagnosis not present

## 2017-03-04 DIAGNOSIS — R05 Cough: Secondary | ICD-10-CM | POA: Diagnosis not present

## 2017-03-04 DIAGNOSIS — Z79899 Other long term (current) drug therapy: Secondary | ICD-10-CM | POA: Diagnosis not present

## 2017-03-04 DIAGNOSIS — Z7982 Long term (current) use of aspirin: Secondary | ICD-10-CM | POA: Diagnosis not present

## 2017-03-04 DIAGNOSIS — I361 Nonrheumatic tricuspid (valve) insufficiency: Secondary | ICD-10-CM | POA: Diagnosis not present

## 2017-03-04 DIAGNOSIS — I2510 Atherosclerotic heart disease of native coronary artery without angina pectoris: Secondary | ICD-10-CM | POA: Diagnosis not present

## 2017-03-04 DIAGNOSIS — R61 Generalized hyperhidrosis: Secondary | ICD-10-CM | POA: Diagnosis not present

## 2017-03-04 HISTORY — PX: CARDIAC CATHETERIZATION: SHX172

## 2017-03-04 HISTORY — DX: Malignant neoplasm of prostate: C61

## 2017-03-04 HISTORY — DX: Non-ST elevation (NSTEMI) myocardial infarction: I21.4

## 2017-03-04 HISTORY — PX: LEFT HEART CATH AND CORONARY ANGIOGRAPHY: CATH118249

## 2017-03-04 LAB — I-STAT TROPONIN, ED
TROPONIN I, POC: 0.25 ng/mL — AB (ref 0.00–0.08)
Troponin i, poc: 0 ng/mL (ref 0.00–0.08)

## 2017-03-04 LAB — CBC
HEMATOCRIT: 44.6 % (ref 39.0–52.0)
HEMOGLOBIN: 15 g/dL (ref 13.0–17.0)
MCH: 33.9 pg (ref 26.0–34.0)
MCHC: 33.6 g/dL (ref 30.0–36.0)
MCV: 100.7 fL — AB (ref 78.0–100.0)
Platelets: 165 10*3/uL (ref 150–400)
RBC: 4.43 MIL/uL (ref 4.22–5.81)
RDW: 12.5 % (ref 11.5–15.5)
WBC: 9.1 10*3/uL (ref 4.0–10.5)

## 2017-03-04 LAB — BASIC METABOLIC PANEL
ANION GAP: 10 (ref 5–15)
Anion gap: 10 (ref 5–15)
BUN: 15 mg/dL (ref 6–20)
BUN: 18 mg/dL (ref 6–20)
CHLORIDE: 107 mmol/L (ref 101–111)
CHLORIDE: 108 mmol/L (ref 101–111)
CO2: 24 mmol/L (ref 22–32)
CO2: 24 mmol/L (ref 22–32)
Calcium: 8.8 mg/dL — ABNORMAL LOW (ref 8.9–10.3)
Calcium: 8.8 mg/dL — ABNORMAL LOW (ref 8.9–10.3)
Creatinine, Ser: 1.1 mg/dL (ref 0.61–1.24)
Creatinine, Ser: 1.03 mg/dL (ref 0.61–1.24)
GFR calc Af Amer: >60 mL/min (ref >60)
GFR calc non Af Amer: >60 mL/min (ref >60)
Glucose, Bld: 133 mg/dL — ABNORMAL HIGH (ref 65–99)
Glucose, Bld: 134 mg/dL — ABNORMAL HIGH (ref 65–99)
POTASSIUM: 3.4 mmol/L — AB (ref 3.5–5.1)
POTASSIUM: 4 mmol/L (ref 3.5–5.1)
SODIUM: 141 mmol/L (ref 135–145)
SODIUM: 142 mmol/L (ref 135–145)

## 2017-03-04 LAB — PROTIME-INR
INR: 1.07
PROTHROMBIN TIME: 13.8 s (ref 11.4–15.2)

## 2017-03-04 LAB — TROPONIN I
Troponin I: 0.89 ng/mL (ref <0.03)
Troponin I: 1.44 ng/mL (ref <0.03)

## 2017-03-04 LAB — MAGNESIUM: MAGNESIUM: 1.9 mg/dL (ref 1.7–2.4)

## 2017-03-04 SURGERY — LEFT HEART CATH AND CORONARY ANGIOGRAPHY
Anesthesia: LOCAL

## 2017-03-04 MED ORDER — SODIUM CHLORIDE 0.9% FLUSH: 3.0000 mL | Freq: Two times a day (BID) | INTRAVENOUS | Status: DC

## 2017-03-04 MED ORDER — ONDANSETRON HCL 4 MG/2ML IJ SOLN: 4.0000 mg | Freq: Four times a day (QID) | INTRAMUSCULAR | Status: DC | PRN

## 2017-03-04 MED ORDER — SODIUM CHLORIDE 0.9 % WEIGHT BASED INFUSION: 3.0000 mL/kg/h | INTRAVENOUS | Status: DC

## 2017-03-04 MED ORDER — SODIUM CHLORIDE 0.9% FLUSH: 3.0000 mL | INTRAVENOUS | Status: DC | PRN

## 2017-03-04 MED ORDER — HEPARIN (PORCINE) IN NACL 2-0.9 UNIT/ML-% IJ SOLN
INTRAMUSCULAR | Status: DC | PRN
  Administered 2017-03-04: 10 mL via INTRA_ARTERIAL

## 2017-03-04 MED ORDER — CLOPIDOGREL BISULFATE 75 MG PO TABS: 75.0000 mg | ORAL_TABLET | Freq: Every day | ORAL | Status: DC

## 2017-03-04 MED ORDER — SODIUM CHLORIDE 0.9 % IV SOLN: 250.0000 mL | INTRAVENOUS | Status: DC | PRN

## 2017-03-04 MED ORDER — ASPIRIN 81 MG PO CHEW: 81.0000 mg | CHEWABLE_TABLET | ORAL | Status: DC

## 2017-03-04 MED ORDER — SODIUM CHLORIDE 0.9% FLUSH
3.0000 mL | Freq: Two times a day (BID) | INTRAVENOUS | Status: DC
  Administered 2017-03-05 (×2): 3 mL via INTRAVENOUS

## 2017-03-04 MED ORDER — ASPIRIN 81 MG PO CHEW
81.0000 mg | CHEWABLE_TABLET | Freq: Every day | ORAL | Status: DC
  Filled 2017-03-04: qty 1

## 2017-03-04 MED ORDER — VERAPAMIL HCL 2.5 MG/ML IV SOLN
INTRAVENOUS | Status: AC
  Filled 2017-03-04: qty 2

## 2017-03-04 MED ORDER — ACETAMINOPHEN 325 MG PO TABS: 650.0000 mg | ORAL_TABLET | ORAL | Status: DC | PRN

## 2017-03-04 MED ORDER — NITROGLYCERIN 0.4 MG SL SUBL
0.4000 mg | SUBLINGUAL_TABLET | Freq: Once | SUBLINGUAL | Status: AC
  Administered 2017-03-04: 0.4 mg via SUBLINGUAL
  Filled 2017-03-04: qty 1

## 2017-03-04 MED ORDER — SODIUM CHLORIDE 0.9 % WEIGHT BASED INFUSION: 1.0000 mL/kg/h | INTRAVENOUS | Status: DC

## 2017-03-04 MED ORDER — IOPAMIDOL (ISOVUE-370) INJECTION 76%
INTRAVENOUS | Status: DC | PRN
  Administered 2017-03-04: 87 mL via INTRA_ARTERIAL

## 2017-03-04 MED ORDER — CLOPIDOGREL BISULFATE 75 MG PO TABS: 300.0000 mg | ORAL_TABLET | Freq: Once | ORAL | Status: DC

## 2017-03-04 MED ORDER — LIDOCAINE HCL 1 % IJ SOLN
INTRAMUSCULAR | Status: AC
  Filled 2017-03-04: qty 20

## 2017-03-04 MED ORDER — SODIUM CHLORIDE 0.9 % IV SOLN: INTRAVENOUS | Status: AC

## 2017-03-04 MED ORDER — CLOPIDOGREL BISULFATE 75 MG PO TABS
75.0000 mg | ORAL_TABLET | Freq: Every day | ORAL | Status: DC
  Administered 2017-03-04 – 2017-03-06 (×3): 75 mg via ORAL
  Filled 2017-03-04 (×3): qty 1

## 2017-03-04 MED ORDER — FENTANYL CITRATE (PF) 100 MCG/2ML IJ SOLN
INTRAMUSCULAR | Status: AC
  Filled 2017-03-04: qty 2

## 2017-03-04 MED ORDER — NITROGLYCERIN 0.4 MG SL SUBL
0.4000 mg | SUBLINGUAL_TABLET | SUBLINGUAL | Status: DC | PRN
  Administered 2017-03-05: 0.4 mg via SUBLINGUAL
  Filled 2017-03-04: qty 1

## 2017-03-04 MED ORDER — MIDAZOLAM HCL 2 MG/2ML IJ SOLN
INTRAMUSCULAR | Status: DC | PRN
  Administered 2017-03-04: 1 mg via INTRAVENOUS

## 2017-03-04 MED ORDER — HEPARIN SODIUM (PORCINE) 1000 UNIT/ML IJ SOLN
INTRAMUSCULAR | Status: AC
  Filled 2017-03-04: qty 1

## 2017-03-04 MED ORDER — HEPARIN (PORCINE) IN NACL 2-0.9 UNIT/ML-% IJ SOLN
INTRAMUSCULAR | Status: AC
  Filled 2017-03-04: qty 1000

## 2017-03-04 MED ORDER — HEPARIN BOLUS VIA INFUSION
4000.0000 [IU] | Freq: Once | INTRAVENOUS | Status: AC
  Administered 2017-03-04: 4000 [IU] via INTRAVENOUS
  Filled 2017-03-04: qty 4000

## 2017-03-04 MED ORDER — ASPIRIN EC 81 MG PO TBEC
81.0000 mg | DELAYED_RELEASE_TABLET | Freq: Every day | ORAL | Status: DC
  Administered 2017-03-06: 10:00:00 81 mg via ORAL
  Filled 2017-03-04: qty 1

## 2017-03-04 MED ORDER — HEPARIN (PORCINE) IN NACL 2-0.9 UNIT/ML-% IJ SOLN
INTRAMUSCULAR | Status: AC | PRN
  Administered 2017-03-04: 1000 mL

## 2017-03-04 MED ORDER — MIDAZOLAM HCL 2 MG/2ML IJ SOLN
INTRAMUSCULAR | Status: AC
  Filled 2017-03-04: qty 2

## 2017-03-04 MED ORDER — IOPAMIDOL (ISOVUE-370) INJECTION 76%
INTRAVENOUS | Status: AC
  Filled 2017-03-04: qty 50

## 2017-03-04 MED ORDER — ASPIRIN EC 81 MG PO TBEC: 81.0000 mg | DELAYED_RELEASE_TABLET | Freq: Every day | ORAL | Status: DC

## 2017-03-04 MED ORDER — CLOPIDOGREL BISULFATE 75 MG PO TABS: 300.0000 mg | ORAL_TABLET | Freq: Every day | ORAL | Status: DC

## 2017-03-04 MED ORDER — HEPARIN (PORCINE) IN NACL 100-0.45 UNIT/ML-% IJ SOLN
1200.0000 [IU]/h | INTRAMUSCULAR | Status: DC
  Administered 2017-03-05: 03:00:00 1200 [IU]/h via INTRAVENOUS
  Filled 2017-03-04: qty 250

## 2017-03-04 MED ORDER — ATORVASTATIN CALCIUM 80 MG PO TABS
80.0000 mg | ORAL_TABLET | Freq: Every day | ORAL | Status: DC
  Administered 2017-03-04 – 2017-03-05 (×2): 80 mg via ORAL
  Filled 2017-03-04 (×2): qty 1

## 2017-03-04 MED ORDER — AMLODIPINE BESYLATE 5 MG PO TABS
5.0000 mg | ORAL_TABLET | Freq: Every morning | ORAL | Status: DC
  Administered 2017-03-04 – 2017-03-06 (×3): 5 mg via ORAL
  Filled 2017-03-04 (×3): qty 1

## 2017-03-04 MED ORDER — OXYCODONE HCL 5 MG PO TABS: 5.0000 mg | ORAL_TABLET | ORAL | Status: DC | PRN

## 2017-03-04 MED ORDER — HEPARIN SODIUM (PORCINE) 1000 UNIT/ML IJ SOLN
INTRAMUSCULAR | Status: DC | PRN
  Administered 2017-03-04: 5000 [IU] via INTRAVENOUS

## 2017-03-04 MED ORDER — POTASSIUM CHLORIDE CRYS ER 20 MEQ PO TBCR
40.0000 meq | EXTENDED_RELEASE_TABLET | Freq: Once | ORAL | Status: AC
  Administered 2017-03-04: 19:00:00 40 meq via ORAL
  Filled 2017-03-04: qty 2

## 2017-03-04 MED ORDER — HEPARIN (PORCINE) IN NACL 100-0.45 UNIT/ML-% IJ SOLN
1200.0000 [IU]/h | INTRAMUSCULAR | Status: DC
  Administered 2017-03-04: 1200 [IU]/h via INTRAVENOUS
  Filled 2017-03-04: qty 250

## 2017-03-04 MED ORDER — ACETAMINOPHEN 325 MG PO TABS
650.0000 mg | ORAL_TABLET | ORAL | Status: DC | PRN
  Administered 2017-03-06: 04:00:00 650 mg via ORAL
  Filled 2017-03-04: qty 2

## 2017-03-04 MED ORDER — FENTANYL CITRATE (PF) 100 MCG/2ML IJ SOLN
INTRAMUSCULAR | Status: DC | PRN
  Administered 2017-03-04: 50 ug via INTRAVENOUS

## 2017-03-04 MED ORDER — LIDOCAINE HCL (PF) 1 % IJ SOLN
INTRAMUSCULAR | Status: DC | PRN
  Administered 2017-03-04: 2 mL

## 2017-03-04 SURGICAL SUPPLY — 17 items
CATH IMPULSE 5F ANG/FL3.5 (CATHETERS) ×2 IMPLANT
CATH INFINITI 5 FR IM (CATHETERS) ×2 IMPLANT
CATH INFINITI 5FR AL1 (CATHETERS) ×2 IMPLANT
CATH LAUNCHER 5F RADR (CATHETERS) ×1 IMPLANT
CATH OPTITORQUE JACKY 4.0 5F (CATHETERS) ×2 IMPLANT
CATHETER LAUNCHER 5F RADR (CATHETERS) ×2
COVER PRB 48X5XTLSCP FOLD TPE (BAG) ×1 IMPLANT
COVER PROBE 5X48 (BAG) ×2
DEVICE RAD COMP TR BAND LRG (VASCULAR PRODUCTS) ×2 IMPLANT
GLIDESHEATH SLEND A-KIT 6F 22G (SHEATH) ×2 IMPLANT
GUIDEWIRE INQWIRE 1.5J.035X260 (WIRE) ×1 IMPLANT
INQWIRE 1.5J .035X260CM (WIRE) ×2
KIT PREMIUM HAND CONTROLLER (KITS) ×2 IMPLANT
KIT SINGLE USE MANIFOLD (KITS) ×2 IMPLANT
PACK CARDIAC CATHETERIZATION (CUSTOM PROCEDURE TRAY) ×2 IMPLANT
PROTECTION STATION PRESSURIZED (MISCELLANEOUS) ×2
STATION PROTECTION PRESSURIZED (MISCELLANEOUS) ×1 IMPLANT

## 2017-03-04 NOTE — H&P (Signed)
History & Physical    Patient ID: [name] [name] MRN: [id number], DOB/AGE: December 31, 1940   Admit date: 03/04/2017   Primary Physician: Helen Hashimoto., MD Primary Cardiologist: Dr. Bettina Gavia  Patient Profile    77 yo male with PMH of CAD s/p Lcx ('09), HTN, HL, and remote hx of tobacco use who presented with chest pain.   Past Medical History   Past Medical History:  Diagnosis Date  . Arthritis   . Cancer [facility])    prostate  . Cerebral vascular malformation    PT'S NEUROLOGIST IS DR. 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 Coronary artery disease    HX OF MI, RIGHT BUNDLE BRANCH BLOCK  . Gallstones    ONE SEVERE EPISODE OF ABDOMINAL PAIN  . Headache(784.0)    POSSIBLE TRIGEMINAL NEURALGIA   . Hyperlipidemia   . Hypertension   . Mass of right kidney    FOUND ON WORK UP FOR ABD PAIN  . Myocardial infarction ([place]) 04-14-07  . Umbilical hernia     Past Surgical History:  Procedure Laterality Date  . CHOLECYSTECTOMY N/A 09/19/2012   Procedure: ROBOTIC ASSISTED LAPAROSCOPIC CHOLECYSTECTOMY WITH UMBILICAL HERNIA REPAIR;  Surgeon: Leighton Ruff, MD;  Location: [facility] ORS;  Service: General;  Laterality: N/A;  . CORONARY ANGIOPLASTY  04/14/07   STENT PLACEMENT  . JOINT REPLACEMENT     RIGHT HIP REPLACEMENT JAN 2003;  RIGHT HIP REVISION 7/20 2010;  LEFT KNEE REPLACEMENT 05/30/10  . RADIOACTIVE SEED IMPLANT FOR PROSTATE CANCER  2 22 2006  . REPLACEMENT TOTAL HIP W/  RESURFACING IMPLANTS    . REPLACEMENT TOTAL KNEE    . ROBOTIC ASSITED PARTIAL NEPHRECTOMY Right 09/19/2012   Procedure: ROBOTIC ASSITED PARTIAL NEPHRECTOMY;  Surgeon: Molli Hazard, MD;  Location: [facility]S;  Service: Urology;  Laterality: Right;  . ROTATOR CUFF REPAIR  03/30/2008   LEFT     Allergies  Allergies  Allergen Reactions  . Ativan [Lorazepam] Other (See Comments)    "Sees things."    History of Present Illness    [name] is  a 77 yo male with PMH of CAD s/p stenting Lcx ('09), HTN, HL, and remote hx of tobacco use. He is followed by Dr. Bettina Gavia as an outpatient. Was last seen in the office back in 4/18 and reported shortness of breath at that time. Underwent stress testing that was normal per his report, but unable to find results in Care Everywhere. He has been on plavix since 2009. Hx of HL but does not take statins, stating "he does not believe in them". On amlodipine for hypertension. Fairly active, works 2 days a week outside the home. Works in the yard and around his home regularly.  This morning woke up around 6:30am and developed right sided chest pain with radiation to the left chest and arm. Went in the kitchen and fixed breakfast but did not eat. Walked into the living room and sat down. Continued to have symptoms, and told his wife. Wife reported he was clammy, pale and weak. Symptoms are just like what he had with previous STEMI back in 2009. Took a nitro at home with some improvement. Wife called EMS. Given 324 ASA, and additional nitro. In the ED his symptoms resolved.   Labs showed stable electrolytes, with exception K+ 3.4, Hgb 15.0. EKG showed SR without acute ischemic changes. Trop was 0.00>>0.25. Placed on heparin in the ED.  Home Medications    Prior to Admission medications   Medication Sig Start Date End Date Taking? Authorizing Provider  amLODipine (NORVASC) 5 MG tablet Take 5 mg by mouth every morning.  07/08/12  Yes [provider]  aspirin 81 MG tablet Take 81 mg by mouth daily as needed for pain.    Yes [provider]  Carboxymethylcellul-Glycerin (LUBRICATING EYE DROPS OP) Place 1 drop into both eyes daily as needed (dry eyes).   Yes [provider]  clopidogrel (PLAVIX) 75 MG tablet Take 75 mg by mouth daily.   Yes [provider]  DM-Doxylamine-Acetaminophen (NYQUIL COLD & FLU PO) Take 15 mLs by mouth at bedtime as needed (cough/cold).   Yes [provider]  nitroGLYCERIN (NITROSTAT) 0.4 MG SL tablet Place 0.4 mg under the tongue every 5 (five) minutes as needed for chest pain.   Yes [provider]  oxyCODONE-acetaminophen (PERCOCET) 5-325 MG per tablet Take 1-2 tablets by mouth every 4 (four) hours as needed for pain. Patient not taking: Reported on 03/04/2017 09/19/12   Rolan Bucco, MD  senna-docusate [facility] S) 8.6-50 MG per tablet Take 1 tablet by mouth 2 (two) times daily. Patient not taking: Reported on 03/04/2017 09/19/12   Rolan Bucco, MD    Family History    Family History  Problem Relation Age of Onset  . Heart attack Mother     Social History    Social History   Socioeconomic History  . Marital status: Married    Spouse name: Not on file  . Number of children: Not on file  . Years of education: Not on file  . Highest education level: Not on file  Social Needs  . Financial resource strain: Not on file  . Food insecurity - worry: Not on file  . Food insecurity - inability: Not on file  . Transportation needs - medical: Not on file  . Transportation needs - non-medical: Not on file  Occupational History  . Not on file  Tobacco Use  . Smoking status: Never Smoker  . Smokeless tobacco: Never Used  Substance and Sexual Activity  . Alcohol use: No  . Drug use: No  . Sexual activity: Not on file  Other Topics Concern  . Not on file  Social History Narrative  . Not on file     Review of Systems    See HPI  All other systems reviewed and are otherwise negative except as noted above.  Physical Exam    Blood pressure 114/82, pulse 67, temperature 97.7 F (36.5 C), temperature source Oral, resp. rate 12, height 6\' 3"  (1.905 m), weight 214 lb (97.1 kg), SpO2 99 %.  General: Pleasant, older WM, NAD Psych: Normal affect. Neuro: Alert and oriented X 3. Moves all extremities spontaneously. HEENT: Normal  Neck: Supple without bruits or JVD. Lungs:  Resp regular and unlabored,  CTA. Heart: RRR no s3, s4, soft systolic murmur. Abdomen: Soft, non-tender, non-distended, BS + x 4.  Extremities: No clubbing, cyanosis or edema. DP/PT/Radials 2+ and equal bilaterally.  Labs    Troponin [facility] of Care Test) Recent Labs    03/04/17 1142  TROPIPOC 0.25*   No results for input(s): CKTOTAL, CKMB, TROPONINI in the last 72 hours. Lab Results  Component Value Date   WBC 9.1 03/04/2017   HGB 15.0 03/04/2017   HCT 44.6 03/04/2017   MCV 100.7 (H) 03/04/2017   PLT 165 03/04/2017    Recent Labs  Lab 03/04/17 0814  NA  141  K 3.4*  CL 107  CO2 24  BUN 15  CREATININE 1.10  CALCIUM 8.8*  GLUCOSE 133*   No results found for: CHOL, HDL, LDLCALC, TRIG No results found for: DDIMER   Radiology Studies    Dg Chest 2 View  Result Date: 03/04/2017 CLINICAL DATA:  Chest pain EXAM: CHEST  2 VIEW COMPARISON:  01/03/2016 FINDINGS: The lungs appear clear.  Cardiac and mediastinal contours normal. No pleural effusion identified. Lower thoracic spondylosis. IMPRESSION: 1.  No active cardiopulmonary disease is radiographically apparent. 2. Lower thoracic spondylosis. Electronically Signed   By: Van Clines M.D.   On: 03/04/2017 08:35    ECG & Cardiac Imaging    EKG: SR without acute ischemia  Assessment & Plan    77 yo male with PMH of CAD s/p Lcx ('09), HTN, HL, and remote hx of tobacco use who presented with chest pain.   1. Unstable Angina: Onset around 630am this morning with associated diaphoresis, weakness and pale. Same as when he had his MI back in 2009. Initial trop was neg, but second 0.25. Placed on IV heparin in the ED. Clinical pictures concerning for ACS.  -- plan for cardiac cath today -- cycle troponins -- The patient understands that risks included but are not limited to stroke (1 in 1000), death (1 in 1000), kidney failure [usually temporary] (1 in 500), bleeding (1 in 200), allergic reaction [possibly serious] (1 in 200).  -- check echo  2. HTN:  Stable with current therapy. BB was stopped by Dr. Bettina Gavia in the past 2/2 underlying lung disease  3. HL:has refused statins in the past.  -- check lipids in the am  4. Hypokalemia: Replaced   Signed, Reino Bellis, NP-C Pager [phone] 03/04/2017, 12:36 PM   Agree with note by Reino Bellis NP-C  [name] has a remote history of stenting after MI back in 2009. He is a patient of Dr. Joya Gaskins and aspirin. Present plans include hypertension and hyperlipidemia. He developed chest pain this morning and had a "ashen appearance" more to his symptoms prior to his MI. His pain has resolved. EKG shows no acute changes. His enzymes are borderline positive. His exam is benign. I agree with proceeding with diagnostic coronary angiography this afternoon. The patient understands that risks included but are not limited to stroke (1 in 1000), death (1 in 16), kidney failure [usually temporary] (1 in 500), bleeding (1 in 200), allergic reaction [possibly serious] (1 in 200). The patient understands and agrees to proceed  Lorretta Harp, M.D., [place], [facility], [place], [place] [address] [place], [place]  [address]  [phone] 03/04/2017 1:10 PM

## 2017-03-04 NOTE — Progress Notes (Signed)
ANTICOAGULATION CONSULT NOTE  Pharmacy Consult:  Heparin Indication: chest pain/ACS  Allergies  Allergen Reactions  . Ativan [Lorazepam] Other (See Comments)    "Sees things."    Patient Measurements: Height: 6\' 3"  (190.5 cm) Weight: 214 lb (97.1 kg) IBW/kg (Calculated) : 84.5 Heparin Dosing Weight: 97 kg   Vital Signs: Temp: 98.6 F (37 C) (02/11 1745) Temp Source: Oral (02/11 1745) BP: 131/81 (02/11 1658) Pulse Rate: 80 (02/11 1658)  Labs: Recent Labs    03/04/17 0814 03/04/17 1309  HGB 15.0  --   HCT 44.6  --   PLT 165  --   LABPROT  --  13.8  INR  --  1.07  CREATININE 1.10  --   TROPONINI  --  0.89*    Estimated Creatinine Clearance: 69.3 mL/min (by C-G formula based on SCr of 1.1 mg/dL).    Assessment: 47 YOM with a history of CAD with stent here with chest pain.  Now s/p cath and Pharmacy consulted to restart IV heparin 8 hours post sheath removal.  TR band removed around 1930 per RN; no bleeding nor hematoma.  Current plan is to try PCI and if unable then consult TCTS.   Goal of Therapy:  Heparin level 0.3-0.7 units/ml Monitor platelets by anticoagulation protocol: Yes    Plan:  At 0330 on 03/05/17, restart IV heparin at 1200 units/hr, no bolus Check 8 hr heparin level Daily heparin level and CBC   Kahlani Graber D. Mina Marble, PharmD, BCPS Pager:  [phone] 03/04/2017, 7:48 PM

## 2017-03-04 NOTE — ED Triage Notes (Signed)
Pt arrives EMS with complaints of chest pain x 2 hours that began while sitting at table. Hx of MI with stent placement ~9 years ago.   1NTG PTA of EMS- pain relieved bp dropped to 86/- 150cc NS given  324ASA given PTA. 4mg   zofran  pain returned but no additional NTG given d/t BP.   90/60 Hr 64 spo2 99% 2lpm

## 2017-03-04 NOTE — Progress Notes (Signed)
TR BAND REMOVAL  LOCATION:    right radial  DEFLATED PER PROTOCOL:    Yes.    TIME BAND OFF / DRESSING APPLIED:    1930   SITE UPON ARRIVAL:    Level 0  SITE AFTER BAND REMOVAL:    Level 0  CIRCULATION SENSATION AND MOVEMENT:    Within Normal Limits   Yes.    COMMENTS:   TOLERATED PROCEDURE WELL 

## 2017-03-04 NOTE — ED Provider Notes (Signed)
[place] EMERGENCY DEPARTMENT Provider Note   CSN: [id number] Arrival date & time: 03/04/17  5277     History   Chief Complaint Chief Complaint  Patient presents with  . Chest Pain    HPI [name] [name] is a 77 y.o. male.  HPI Patient presents with chest pain.  Started in right shoulder and then went across to the left chest.  Began this morning.  Has not had any recent pain.  Previous history of coronary artery disease with stent.  That was around 9 years ago.  Does not normally get chest pain.  Around 1 year ago had a negative stress test.  Had slight sweatiness with this today.  No fevers.  No chills.  Has had a cough.  No production.  No swelling in his legs.  No relief with nitroglycerin at home but states it is felt somewhat better with aspirin given by EMS and then states the pain is coming back now. Past Medical History:  Diagnosis Date  . Arthritis   . Cancer [facility])    prostate  . Cerebral vascular malformation    PT'S NEUROLOGIST IS DR. 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 Coronary artery disease    HX OF MI, RIGHT BUNDLE BRANCH BLOCK  . Gallstones    ONE SEVERE EPISODE OF ABDOMINAL PAIN  . Headache(784.0)    POSSIBLE TRIGEMINAL NEURALGIA   . Hyperlipidemia   . Hypertension   . Mass of right kidney    FOUND ON WORK UP FOR ABD PAIN  . Myocardial infarction ([place]) 04-14-07  . Umbilical hernia     There are no active problems to display for this patient.   Past Surgical History:  Procedure Laterality Date  . CHOLECYSTECTOMY N/A 09/19/2012   Procedure: ROBOTIC ASSISTED LAPAROSCOPIC CHOLECYSTECTOMY WITH UMBILICAL HERNIA REPAIR;  Surgeon: Leighton Ruff, MD;  Location: [facility] ORS;  Service: General;  Laterality: N/A;  . CORONARY ANGIOPLASTY  04/14/07   STENT PLACEMENT  . JOINT REPLACEMENT     RIGHT HIP REPLACEMENT JAN 2003;  RIGHT HIP REVISION 7/20 2010;  LEFT KNEE  REPLACEMENT 05/30/10  . RADIOACTIVE SEED IMPLANT FOR PROSTATE CANCER  2 22 2006  . REPLACEMENT TOTAL HIP W/  RESURFACING IMPLANTS    . REPLACEMENT TOTAL KNEE    . ROBOTIC ASSITED PARTIAL NEPHRECTOMY Right 09/19/2012   Procedure: ROBOTIC ASSITED PARTIAL NEPHRECTOMY;  Surgeon: Molli Hazard, MD;  Location: [facility];  Service: Urology;  Laterality: Right;  . ROTATOR CUFF REPAIR  03/30/2008   LEFT       Home Medications    Prior to Admission medications   Medication Sig Start Date End Date Taking? Authorizing Provider  amLODipine (NORVASC) 5 MG tablet Take 5 mg by mouth every morning.  07/08/12  Yes [provider]  aspirin 81 MG tablet Take 81 mg by mouth daily as needed for pain.    Yes [provider]  Carboxymethylcellul-Glycerin (LUBRICATING EYE DROPS OP) Place 1 drop into both eyes daily as needed (dry eyes).   Yes [provider]  clopidogrel (PLAVIX) 75 MG tablet Take 75 mg by mouth daily.   Yes [provider]  DM-Doxylamine-Acetaminophen (NYQUIL COLD & FLU PO) Take 15 mLs by mouth at bedtime as needed (cough/cold).   Yes [provider]  nitroGLYCERIN (NITROSTAT) 0.4 MG SL tablet Place 0.4 mg under the tongue every 5 (five) minutes as needed for  chest pain.   Yes [provider]  oxyCODONE-acetaminophen (PERCOCET) 5-325 MG per tablet Take 1-2 tablets by mouth every 4 (four) hours as needed for pain. Patient not taking: Reported on 03/04/2017 09/19/12   Rolan Bucco, MD  senna-docusate [facility]) 8.6-50 MG per tablet Take 1 tablet by mouth 2 (two) times daily. Patient not taking: Reported on 03/04/2017 09/19/12   Rolan Bucco, MD    Family History Family History  Problem Relation Age of Onset  . Heart attack Mother     Social History Social History   Tobacco Use  . Smoking status: Never Smoker  . Smokeless tobacco: Never Used  Substance Use Topics  . Alcohol use: No  . Drug use: No     Allergies   Ativan  [lorazepam]   Review of Systems Review of Systems  Constitutional: Positive for diaphoresis. Negative for appetite change.  HENT: Negative for congestion.   Respiratory: Positive for cough.   Cardiovascular: Positive for chest pain.  Gastrointestinal: Negative for abdominal pain.  Genitourinary: Negative for flank pain.  Musculoskeletal: Negative for gait problem.  Neurological: Negative for numbness.  Hematological: Negative for adenopathy.  Psychiatric/Behavioral: Negative for confusion.     Physical Exam Updated Vital Signs BP 114/82   Pulse 67   Temp 97.7 F (36.5 C) (Oral)   Resp 12   Ht 6\' 3"  (1.905 m)   Wt 97.1 kg (214 lb)   SpO2 99%   BMI 26.75 kg/m   Physical Exam  Constitutional: He appears well-developed.  HENT:  Head: Normocephalic.  Neck: Neck supple.  Cardiovascular: Normal rate.  No chest tenderness  Pulmonary/Chest: Effort normal and breath sounds normal.  Musculoskeletal:       Right lower leg: He exhibits no edema.       Left lower leg: He exhibits no edema.  Neurological: He is alert.  Skin: Skin is warm. Capillary refill takes less than 2 seconds.  Psychiatric: He has a normal mood and affect.     ED Treatments / Results  Labs (all labs ordered are listed, but only abnormal results are displayed) Labs Reviewed  BASIC METABOLIC PANEL - Abnormal; Notable for the following components:      Result Value   Potassium 3.4 (*)    Glucose, Bld 133 (*)    Calcium 8.8 (*)    All other components within normal limits  CBC - Abnormal; Notable for the following components:   MCV 100.7 (*)    All other components within normal limits  I-STAT TROPONIN, ED - Abnormal; Notable for the following components:   Troponin i, poc 0.25 (*)    All other components within normal limits  HEPARIN LEVEL (UNFRACTIONATED)  I-STAT TROPONIN, ED    EKG  EKG Interpretation  Date/Time:  Monday March 04 2017 08:17:33 EST Ventricular Rate:  69 PR Interval:      QRS Duration: 97 QT Interval:  464 QTC Calculation: 498 R Axis:   10 Text Interpretation:  Sinus rhythm Ventricular premature complex Borderline prolonged PR interval Low voltage, precordial leads Posterior infarct, old Confirmed by Davonna Belling [phone]) on 03/04/2017 8:22:42 AM       Radiology Dg Chest 2 View  Result Date: 03/04/2017 CLINICAL DATA:  Chest pain EXAM: CHEST  2 VIEW COMPARISON:  01/03/2016 FINDINGS: The lungs appear clear.  Cardiac and mediastinal contours normal. No pleural effusion identified. Lower thoracic spondylosis. IMPRESSION: 1.  No active cardiopulmonary disease is radiographically apparent. 2. Lower thoracic spondylosis. Electronically Signed  By: Van Clines M.D.   On: 03/04/2017 08:35    Procedures Procedures (including critical care time)  Medications Ordered in ED Medications  heparin ADULT infusion 100 units/mL (25000 units/262mL sodium chloride 0.45%) (1,200 Units/hr Intravenous New Bag/Given 03/04/17 1301)  potassium chloride SA (K-DUR,KLOR-CON) CR tablet 40 mEq (not administered)  nitroGLYCERIN (NITROSTAT) SL tablet 0.4 mg (0.4 mg Sublingual Given 03/04/17 0841)  heparin bolus via infusion 4,000 Units (4,000 Units Intravenous Bolus from Bag 03/04/17 1302)     Initial Impression / Assessment and Plan / ED Course  I have reviewed the triage vital signs and the nursing notes.  Pertinent labs & imaging results that were available during my care of the patient were reviewed by me and considered in my medical decision making (see chart for details).     Patient with right-sided chest pain.  Began this morning.  Pressure in the chest felt somewhat like previous MI but not as severe.  Last MI was around 9 years ago.  Has had hypotension at home but appears to be somewhat baseline.  Pain improved with nitroglycerin and aspirin.  Initial troponin negative but second elevated.  Will admit to cardiology for non-STEMI.  CRITICAL CARE Performed by:  Davonna Belling Total critical care time: 30 minutes Critical care time was exclusive of separately billable procedures and treating other patients. Critical care was necessary to treat or prevent imminent or life-threatening deterioration. Critical care was time spent personally by me on the following activities: development of treatment plan with patient and/or surrogate as well as nursing, discussions with consultants, evaluation of patient's response to treatment, examination of patient, obtaining history from patient or surrogate, ordering and performing treatments and interventions, ordering and review of laboratory studies, ordering and review of radiographic studies, pulse oximetry and re-evaluation of patient's condition.  Final Clinical Impressions(s) / ED Diagnoses   Final diagnoses:  NSTEMI (non-ST elevated myocardial infarction) [facility])    ED Discharge Orders    None       Davonna Belling, MD 03/04/17 1305

## 2017-03-04 NOTE — ED Notes (Addendum)
Pt ambulated with no distress or cp, Pick[place] MD aware

## 2017-03-04 NOTE — Interval H&P Note (Signed)
Cath Lab Visit (complete for each Cath Lab visit)  Clinical Evaluation Leading to the Procedure:   ACS: Yes.    Non-ACS:    Anginal Classification: CCS [name]  Anti-ischemic medical therapy: Minimal Therapy (1 class of medications)  Non-Invasive Test Results: No non-invasive testing performed  Prior CABG: No previous CABG      History and Physical Interval Note:  03/04/2017 4:00 PM  [name]  has presented today for surgery, with the diagnosis of cp  The various methods of treatment have been discussed with the patient and family. After consideration of risks, benefits and other options for treatment, the patient has consented to  Procedure(s): LEFT HEART CATH AND CORONARY ANGIOGRAPHY (N/A) as a surgical intervention .  The patient's history has been reviewed, patient examined, no change in status, stable for surgery.  I have reviewed the patient's chart and labs.  Questions were answered to the patient's satisfaction.     [name] [name]

## 2017-03-04 NOTE — Progress Notes (Signed)
ANTICOAGULATION CONSULT NOTE - Initial Consult  Pharmacy Consult for Heparin Indication: chest pain/ACS  Allergies  Allergen Reactions  . Ativan [Lorazepam] Other (See Comments)    "Sees things."    Patient Measurements: Height: 6\' 3"  (190.5 cm) Weight: 214 lb (97.1 kg) IBW/kg (Calculated) : 84.5 Heparin Dosing Weight: 97 kg   Vital Signs: Temp: 97.7 F (36.5 C) (02/11 0820) Temp Source: Oral (02/11 0820) BP: 114/82 (02/11 0915) Pulse Rate: 67 (02/11 0915)  Labs: Recent Labs    03/04/17 0814  HGB 15.0  HCT 44.6  PLT 165  CREATININE 1.10    Estimated Creatinine Clearance: 69.3 mL/min (by C-G formula based on SCr of 1.1 mg/dL).   Medical History: Past Medical History:  Diagnosis Date  . Arthritis   . Cancer [facility])    prostate  . Cerebral vascular malformation    PT'S NEUROLOGIST IS DR. 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 Coronary artery disease    HX OF MI, RIGHT BUNDLE BRANCH BLOCK  . Gallstones    ONE SEVERE EPISODE OF ABDOMINAL PAIN  . Headache(784.0)    POSSIBLE TRIGEMINAL NEURALGIA   . Hyperlipidemia   . Hypertension   . Mass of right kidney    FOUND ON WORK UP FOR ABD PAIN  . Myocardial infarction ([place]) 04-14-07  . Umbilical hernia     Medications:   (Not in a hospital admission)  Assessment: 27 YOM with a history of CAD with stent here with chest pain. Pharmacy consulted to start IV heparin for ACS. H/H and plt wnl. He is not on any anticoagulation prior to admission.   Goal of Therapy:  Heparin level 0.3-0.7 units/ml Monitor platelets by anticoagulation protocol: Yes   Plan:  -Heparin 4000 units IV bolus, then start IV heparin at 1200 units/hr -F/u 8 hr HL -Monitor daily HL, CBC and s/s of bleeding   Albertina Parr, PharmD., BCPS Clinical Pharmacist Pager [phone]

## 2017-03-05 ENCOUNTER — Ambulatory Visit (HOSPITAL_COMMUNITY): Admission: RE | Admit: 2017-03-05 | Payer: PPO | Source: Ambulatory Visit | Admitting: Cardiology

## 2017-03-05 ENCOUNTER — Encounter (HOSPITAL_COMMUNITY): Payer: Self-pay | Admitting: Interventional Cardiology

## 2017-03-05 ENCOUNTER — Inpatient Hospital Stay (HOSPITAL_COMMUNITY)
Admission: EM | Disposition: A | Discharge status: Home / Self Care | Payer: Self-pay | Source: Home / Self Care | Attending: Cardiovascular Disease

## 2017-03-05 ENCOUNTER — Inpatient Hospital Stay (HOSPITAL_COMMUNITY): Payer: PPO

## 2017-03-05 ENCOUNTER — Other Ambulatory Visit: Payer: Self-pay

## 2017-03-05 DIAGNOSIS — I2510 Atherosclerotic heart disease of native coronary artery without angina pectoris: Secondary | ICD-10-CM

## 2017-03-05 DIAGNOSIS — E785 Hyperlipidemia, unspecified: Secondary | ICD-10-CM

## 2017-03-05 DIAGNOSIS — I10 Essential (primary) hypertension: Secondary | ICD-10-CM

## 2017-03-05 HISTORY — PX: ULTRASOUND GUIDANCE FOR VASCULAR ACCESS: SHX6516

## 2017-03-05 HISTORY — PX: CORONARY STENT INTERVENTION: CATH118234

## 2017-03-05 LAB — POCT ACTIVATED CLOTTING TIME: ACTIVATED CLOTTING TIME: 687 s

## 2017-03-05 LAB — BASIC METABOLIC PANEL
Anion gap: 11 (ref 5–15)
BUN: 18 mg/dL (ref 6–20)
CO2: 21 mmol/L — ABNORMAL LOW (ref 22–32)
Calcium: 8.7 mg/dL — ABNORMAL LOW (ref 8.9–10.3)
Chloride: 107 mmol/L (ref 101–111)
Creatinine, Ser: 0.93 mg/dL (ref 0.61–1.24)
Glucose, Bld: 99 mg/dL (ref 65–99)
POTASSIUM: 4.2 mmol/L (ref 3.5–5.1)
Sodium: 139 mmol/L (ref 135–145)

## 2017-03-05 LAB — LIPID PANEL
CHOL/HDL RATIO: 4.3 ratio
CHOLESTEROL: 151 mg/dL (ref 0–200)
HDL: 35 mg/dL — AB (ref >40)
LDL Cholesterol: 92 mg/dL (ref 0–99)
TRIGLYCERIDES: 122 mg/dL (ref <150)
VLDL: 24 mg/dL (ref 0–40)

## 2017-03-05 LAB — CBC
HEMATOCRIT: 44 % (ref 39.0–52.0)
Hemoglobin: 15 g/dL (ref 13.0–17.0)
MCH: 34.1 pg — AB (ref 26.0–34.0)
MCHC: 34.1 g/dL (ref 30.0–36.0)
MCV: 100 fL (ref 78.0–100.0)
Platelets: 147 10*3/uL — ABNORMAL LOW (ref 150–400)
RBC: 4.4 MIL/uL (ref 4.22–5.81)
RDW: 12.5 % (ref 11.5–15.5)
WBC: 6.5 10*3/uL (ref 4.0–10.5)

## 2017-03-05 LAB — HEMOGLOBIN A1C
HEMOGLOBIN A1C: 5.2 % (ref 4.8–5.6)
MEAN PLASMA GLUCOSE: 102.54 mg/dL

## 2017-03-05 LAB — TROPONIN I: Troponin I: 0.76 ng/mL (ref <0.03)

## 2017-03-05 SURGERY — CORONARY STENT INTERVENTION
Anesthesia: LOCAL

## 2017-03-05 MED ORDER — SODIUM CHLORIDE 0.9% FLUSH: 3.0000 mL | INTRAVENOUS | Status: DC | PRN

## 2017-03-05 MED ORDER — SODIUM CHLORIDE 0.9 % WEIGHT BASED INFUSION
3.0000 mL/kg/h | INTRAVENOUS | Status: DC
  Administered 2017-03-05: 3 mL/kg/h via INTRAVENOUS

## 2017-03-05 MED ORDER — MIDAZOLAM HCL 2 MG/2ML IJ SOLN
INTRAMUSCULAR | Status: DC | PRN
  Administered 2017-03-05: 2 mg via INTRAVENOUS

## 2017-03-05 MED ORDER — METOPROLOL TARTRATE 12.5 MG HALF TABLET
12.5000 mg | ORAL_TABLET | Freq: Two times a day (BID) | ORAL | Status: DC
  Administered 2017-03-05 (×2): 12.5 mg via ORAL
  Filled 2017-03-05 (×2): qty 1

## 2017-03-05 MED ORDER — HYDRALAZINE HCL 20 MG/ML IJ SOLN: 5.0000 mg | INTRAMUSCULAR | Status: AC | PRN

## 2017-03-05 MED ORDER — SODIUM CHLORIDE 0.9 % IV SOLN
250.0000 mL | INTRAVENOUS | Status: DC | PRN
Start: 2017-03-05 — End: 2017-03-05

## 2017-03-05 MED ORDER — FENTANYL CITRATE (PF) 100 MCG/2ML IJ SOLN
INTRAMUSCULAR | Status: DC | PRN
  Administered 2017-03-05: 25 ug via INTRAVENOUS

## 2017-03-05 MED ORDER — BIVALIRUDIN BOLUS VIA INFUSION - CUPID
INTRAVENOUS | Status: DC | PRN
  Administered 2017-03-05: 73.125 mg via INTRAVENOUS

## 2017-03-05 MED ORDER — NITROGLYCERIN 1 MG/10 ML FOR IR/CATH LAB
INTRA_ARTERIAL | Status: DC | PRN
  Administered 2017-03-05: 200 ug via INTRACORONARY

## 2017-03-05 MED ORDER — SODIUM CHLORIDE 0.9 % WEIGHT BASED INFUSION
1.0000 mL/kg/h | INTRAVENOUS | Status: AC
  Administered 2017-03-05: 12:00:00 1 mL/kg/h via INTRAVENOUS

## 2017-03-05 MED ORDER — ASPIRIN 81 MG PO CHEW
81.0000 mg | CHEWABLE_TABLET | ORAL | Status: AC
  Administered 2017-03-05: 81 mg via ORAL

## 2017-03-05 MED ORDER — SODIUM CHLORIDE 0.9% FLUSH
3.0000 mL | Freq: Two times a day (BID) | INTRAVENOUS | Status: DC
  Administered 2017-03-05: 22:00:00 3 mL via INTRAVENOUS

## 2017-03-05 MED ORDER — SODIUM CHLORIDE 0.9 % WEIGHT BASED INFUSION
1.0000 mL/kg/h | INTRAVENOUS | Status: DC
  Administered 2017-03-05: 1 mL/kg/h via INTRAVENOUS

## 2017-03-05 MED ORDER — IOPAMIDOL (ISOVUE-370) INJECTION 76%
INTRAVENOUS | Status: DC | PRN
  Administered 2017-03-05: 71.5 mL via INTRA_ARTERIAL

## 2017-03-05 MED ORDER — SODIUM CHLORIDE 0.9% FLUSH
3.0000 mL | Freq: Two times a day (BID) | INTRAVENOUS | Status: DC
  Administered 2017-03-05: 09:00:00 3 mL via INTRAVENOUS

## 2017-03-05 MED ORDER — BIVALIRUDIN TRIFLUOROACETATE 250 MG IV SOLR
INTRAVENOUS | Status: DC | PRN
  Administered 2017-03-05 (×2): 1.75 mg/kg/h via INTRAVENOUS

## 2017-03-05 MED ORDER — SODIUM CHLORIDE 0.9 % IV SOLN: 250.0000 mL | INTRAVENOUS | Status: DC | PRN

## 2017-03-05 MED ORDER — DIPHENHYDRAMINE HCL 12.5 MG/5ML PO ELIX
12.5000 mg | ORAL_SOLUTION | Freq: Once | ORAL | Status: AC
  Administered 2017-03-05: 12.5 mg via ORAL
  Filled 2017-03-05: qty 5

## 2017-03-05 MED ORDER — LIDOCAINE HCL (PF) 1 % IJ SOLN
INTRAMUSCULAR | Status: DC | PRN
  Administered 2017-03-05: 15 mL

## 2017-03-05 MED ORDER — HEPARIN (PORCINE) IN NACL 2-0.9 UNIT/ML-% IJ SOLN
INTRAMUSCULAR | Status: AC | PRN
  Administered 2017-03-05: 1000 mL

## 2017-03-05 MED ORDER — LABETALOL HCL 5 MG/ML IV SOLN: 10.0000 mg | INTRAVENOUS | Status: AC | PRN

## 2017-03-05 MED FILL — Lidocaine HCl Local Inj 1%: INTRAMUSCULAR | Qty: 20 | Status: AC

## 2017-03-05 MED FILL — Heparin Sodium (Porcine) 2 Unit/ML in Sodium Chloride 0.9%: INTRAMUSCULAR | Qty: 1000 | Status: AC

## 2017-03-05 SURGICAL SUPPLY — 21 items
BALLN EMERGE MR 2.5X12 (BALLOONS) ×3
BALLN SAPPHIRE ~~LOC~~ 3.0X8 (BALLOONS) ×3 IMPLANT
BALLN SAPPHIRE ~~LOC~~ 3.75X12 (BALLOONS) ×3 IMPLANT
BALLOON EMERGE MR 2.5X12 (BALLOONS) ×2 IMPLANT
CATH LAUNCHER 6FR 3DRC (CATHETERS) ×2 IMPLANT
CATH LAUNCHER 6FR EBU3.5 (CATHETERS) ×3 IMPLANT
CATHETER LAUNCHER 6FR 3DRC (CATHETERS) ×3
COVER PRB 48X5XTLSCP FOLD TPE (BAG) ×2 IMPLANT
COVER PROBE 5X48 (BAG) ×3
GUIDELINER 6F (CATHETERS) ×3 IMPLANT
KIT ENCORE 26 ADVANTAGE (KITS) ×3 IMPLANT
KIT PREMIUM HAND CONTROLLER (KITS) ×3 IMPLANT
KIT SINGLE USE MANIFOLD (KITS) ×3 IMPLANT
PACK CARDIAC CATHETERIZATION (CUSTOM PROCEDURE TRAY) ×3 IMPLANT
PROTECTION STATION PRESSURIZED (MISCELLANEOUS) ×3
SHEATH PINNACLE 6F 10CM (SHEATH) ×3 IMPLANT
STATION PROTECTION PRESSURIZED (MISCELLANEOUS) ×2 IMPLANT
STENT SYNERGY DES 2.75X12 (Permanent Stent) ×3 IMPLANT
STENT SYNERGY DES 3.5X16 (Permanent Stent) ×3 IMPLANT
WIRE ASAHI PROWATER 180CM (WIRE) ×3 IMPLANT
WIRE EMERALD 3MM-J .035X150CM (WIRE) ×3 IMPLANT

## 2017-03-05 NOTE — Interval H&P Note (Signed)
History and Physical Interval Note:  03/05/2017 10:17 AM  [name] [name] [name] [name]  has presented today for surgery, with the diagnosis of cad  The various methods of treatment have been discussed with the patient and family. After consideration of risks, benefits and other options for treatment, the patient has consented to  Procedure(s): CORONARY STENT INTERVENTION (N/A) as a surgical intervention .  The patient's history has been reviewed, patient examined, no change in status, stable for surgery.  I have reviewed the patient's chart and labs.  Questions were answered to the patient's satisfaction.   Cath Lab Visit (complete for each Cath Lab visit)  Clinical Evaluation Leading to the Procedure:   ACS: Yes.    Non-ACS:    Anginal Classification: CCS IV  Anti-ischemic medical therapy: Minimal Therapy (1 class of medications)  Non-Invasive Test Results: No non-invasive testing performed  Prior CABG: No previous CABG       [name] [name] [facility] 03/05/2017 10:18 AM

## 2017-03-05 NOTE — Progress Notes (Signed)
Patient reported very mild pressure across upper chest this morning, relieved with 1 SL nitro.  Harlan Stains, NP aware.

## 2017-03-05 NOTE — Progress Notes (Signed)
Progress Note  Patient Name: [name] [name] Date of Encounter: 03/05/2017  Primary Cardiologist: Shirlee More, MD  Subjective   Didn't sleep much. Some mild 1/10 chest tightness this morning. Planned for cath with femoral access today.   Inpatient Medications    Scheduled Meds: . amLODipine  5 mg Oral q morning - 10a  . aspirin  81 mg Oral Daily  . aspirin EC  81 mg Oral Daily  . atorvastatin  80 mg Oral q1800  . clopidogrel  75 mg Oral Daily  . sodium chloride flush  3 mL Intravenous Q12H   Continuous Infusions: . sodium chloride    . heparin 1,200 Units/hr (03/05/17 0315)   PRN Meds: sodium chloride, acetaminophen, nitroGLYCERIN, ondansetron (ZOFRAN) IV, oxyCODONE, sodium chloride flush   Vital Signs    Vitals:   03/04/17 1900 03/04/17 2000 03/04/17 2100 03/05/17 0321  BP: (!) 149/79 (!) 116/59 (!) 146/82 (!) 150/76  Pulse: 80 89 (!) 101 75  Resp: (!) 21 (!) 21 (!) 24 13  Temp:  98.4 F (36.9 C)  98.5 F (36.9 C)  TempSrc:  Oral  Oral  SpO2: 99% 93% 94% 94%  Weight:    214 lb 15.2 oz (97.5 kg)  Height:        Intake/Output Summary (Last 24 hours) at 03/05/2017 0747 Last data filed at 03/05/2017 0324 Gross per 24 hour  Intake 636.25 ml  Output 400 ml  Net 236.25 ml   Filed Weights   03/04/17 0820 03/05/17 0321  Weight: 214 lb (97.1 kg) 214 lb 15.2 oz (97.5 kg)    Telemetry    SR with PVCs - Personally Reviewed  ECG    N/a - Personally Reviewed  Physical Exam   General: Well developed, well nourished, male appearing in no acute distress. Head: Normocephalic, atraumatic.  Neck: Supple without bruits, JVD. Lungs:  Resp regular and unlabored, CTA. Heart: RRR, S1, S2, no S3, S4, soft systolic murmur; no rub. Abdomen: Soft, non-tender, non-distended with normoactive bowel sounds. Extremities: No clubbing, cyanosis, edema. Distal pedal pulses are 2+ bilaterally. Right radial site stable.  Neuro: Alert and oriented X 3. Moves all extremities  spontaneously. Psych: Normal affect.  Labs    Chemistry Recent Labs  Lab 03/04/17 0814 03/04/17 2231 03/05/17 0350  NA 141 142 139  K 3.4* 4.0 4.2  CL 107 108 107  CO2 24 24 21*  GLUCOSE 133* 134* 99  BUN 15 18 18   CREATININE 1.10 1.03 0.93  CALCIUM 8.8* 8.8* 8.7*  GFRNONAA >60 >60 >60  GFRAA >60 >60 >60  ANIONGAP 10 10 11      Hematology Recent Labs  Lab 03/04/17 0814 03/05/17 0350  WBC 9.1 6.5  RBC 4.43 4.40  HGB 15.0 15.0  HCT 44.6 44.0  MCV 100.7* 100.0  MCH 33.9 34.1*  MCHC 33.6 34.1  RDW 12.5 12.5  PLT 165 147*    Cardiac Enzymes Recent Labs  Lab 03/04/17 1309 03/04/17 1848 03/05/17 0350  TROPONINI 0.89* 1.44* 0.76*    Recent Labs  Lab 03/04/17 0829 03/04/17 1142  TROPIPOC 0.00 0.25*     BNPNo results for input(s): BNP, PROBNP in the last 168 hours.   DDimer No results for input(s): DDIMER in the last 168 hours.    Radiology    Dg Chest 2 View  Result Date: 03/04/2017 CLINICAL DATA:  Chest pain EXAM: CHEST  2 VIEW COMPARISON:  01/03/2016 FINDINGS: The lungs appear clear.  Cardiac and mediastinal contours normal. No  pleural effusion identified. Lower thoracic spondylosis. IMPRESSION: 1.  No active cardiopulmonary disease is radiographically apparent. 2. Lower thoracic spondylosis. Electronically Signed   By: Van Clines M.D.   On: 03/04/2017 08:35    Cardiac Studies   Cath: 03/04/17  Conclusion    Moderate to severe three-vessel coronary disease.  Native right coronary and LAD are heavily calcified.  Distal left main 25-30% narrowing.  LAD proximal to mid diffuse calcified 60-70% narrowing.  High-grade obstruction in the circumflex/marginal with greater than 90% obstruction.  This lesion is beyond a tortuous segment which contains previously placed stents.  The more proximal stent contains diffuse 40% ISR.  Native right coronary is moderately to heavily calcified from proximal to mid vessel.  The mid vessel beyond the first  acute marginal branch contains eccentric 75-80% narrowing  Left ventricular end-diastolic pressure is normal.  The anterior wall and parts of the inferior wall and noted to move normally by hand injection.  Estimated ejection fraction is over 50%.  RECOMMENDATIONS:   Continue Plavix  Most likely treatment strategy should be PCI of the culprit lesion in the circumflex.  Tortuosity will make the procedure difficult from the standpoint of stent delivery.  Recommend performing the procedure from femoral approach given difficulties with catheter control from the right arm.  May need a catheter extension device to deliver the stent in the circumflex.  If the circumflex goes well without significant contrast or time required, consider stenting the mid to distal RCA.  An alternative approach may be to perform FFR on the proximal to mid LAD and if positive, consider surgical revascularization.  Will discuss with colleagues.   Patient Profile     77 y.o. male with PMH of CAD s/p Lcx ('09), HTN, HL, and remote hx of tobacco use who presented with chest pain/NSTEMI.   Assessment & Plan    1. NSTEMI: Troponin peaked at 1.44. Underwent cath yesterday with lesion in the Lcx but unable to intervene given the tortuosity of vessel from radial access. Planned for cath today from femoral approach. Possible intervention to the dRCA.  -- remains on IV heparin, ASA, statin  2. HTN: Appears was on coreg in the past but was stopped. He is unsure of why this was.  -- add low dose metoprolol, continue amlodipine  3. HL: LDL 92, added back high dose statin  Signed, Reino Bellis, NP  03/05/2017, 7:47 AM  Pager # [phone]   For questions or updates, please contact [place] Please consult [email or web address] for contact info under Cardiology/STEMI.    Agree with note by Reino Bellis NP-C  Mr. Brander and had radial cath yesterday by Dr. Tamala Julian revealing significant disease in the distal RCA and mid  circumflex. Because of tortuosity in the subclavian system Dr. Tamala Julian did not feel that he could get adequate guide support for intervention. He is on IV heparin. He did have an episode of chest pain this morning. He scheduled for PCI and stenting via the femoral approach today.  Lorretta Harp, M.D., [place], [facility], [place] [address] [place], [place]  [id number]  [phone] 03/05/2017 9:32 AM

## 2017-03-05 NOTE — H&P (View-Only) (Signed)
Progress Note  Patient Name: [name] [name] Date of Encounter: 03/05/2017  Primary Cardiologist: Shirlee More, MD  Subjective   Didn't sleep much. Some mild 1/10 chest tightness this morning. Planned for cath with femoral access today.   Inpatient Medications    Scheduled Meds: . amLODipine  5 mg Oral q morning - 10a  . aspirin  81 mg Oral Daily  . aspirin EC  81 mg Oral Daily  . atorvastatin  80 mg Oral q1800  . clopidogrel  75 mg Oral Daily  . sodium chloride flush  3 mL Intravenous Q12H   Continuous Infusions: . sodium chloride    . heparin 1,200 Units/hr (03/05/17 0315)   PRN Meds: sodium chloride, acetaminophen, nitroGLYCERIN, ondansetron (ZOFRAN) IV, oxyCODONE, sodium chloride flush   Vital Signs    Vitals:   03/04/17 1900 03/04/17 2000 03/04/17 2100 03/05/17 0321  BP: (!) 149/79 (!) 116/59 (!) 146/82 (!) 150/76  Pulse: 80 89 (!) 101 75  Resp: (!) 21 (!) 21 (!) 24 13  Temp:  98.4 F (36.9 C)  98.5 F (36.9 C)  TempSrc:  Oral  Oral  SpO2: 99% 93% 94% 94%  Weight:    214 lb 15.2 oz (97.5 kg)  Height:        Intake/Output Summary (Last 24 hours) at 03/05/2017 0747 Last data filed at 03/05/2017 0324 Gross per 24 hour  Intake 636.25 ml  Output 400 ml  Net 236.25 ml   Filed Weights   03/04/17 0820 03/05/17 0321  Weight: 214 lb (97.1 kg) 214 lb 15.2 oz (97.5 kg)    Telemetry    SR with PVCs - Personally Reviewed  ECG    N/a - Personally Reviewed  Physical Exam   General: Well developed, well nourished, male appearing in no acute distress. Head: Normocephalic, atraumatic.  Neck: Supple without bruits, JVD. Lungs:  Resp regular and unlabored, CTA. Heart: RRR, S1, S2, no S3, S4, soft systolic murmur; no rub. Abdomen: Soft, non-tender, non-distended with normoactive bowel sounds. Extremities: No clubbing, cyanosis, edema. Distal pedal pulses are 2+ bilaterally. Right radial site stable.  Neuro: Alert and oriented X 3. Moves all extremities  spontaneously. Psych: Normal affect.  Labs    Chemistry Recent Labs  Lab 03/04/17 0814 03/04/17 2231 03/05/17 0350  NA 141 142 139  K 3.4* 4.0 4.2  CL 107 108 107  CO2 24 24 21*  GLUCOSE 133* 134* 99  BUN 15 18 18   CREATININE 1.10 1.03 0.93  CALCIUM 8.8* 8.8* 8.7*  GFRNONAA >60 >60 >60  GFRAA >60 >60 >60  ANIONGAP 10 10 11      Hematology Recent Labs  Lab 03/04/17 0814 03/05/17 0350  WBC 9.1 6.5  RBC 4.43 4.40  HGB 15.0 15.0  HCT 44.6 44.0  MCV 100.7* 100.0  MCH 33.9 34.1*  MCHC 33.6 34.1  RDW 12.5 12.5  PLT 165 147*    Cardiac Enzymes Recent Labs  Lab 03/04/17 1309 03/04/17 1848 03/05/17 0350  TROPONINI 0.89* 1.44* 0.76*    Recent Labs  Lab 03/04/17 0829 03/04/17 1142  TROPIPOC 0.00 0.25*     BNPNo results for input(s): BNP, PROBNP in the last 168 hours.   DDimer No results for input(s): DDIMER in the last 168 hours.    Radiology    Dg Chest 2 View  Result Date: 03/04/2017 CLINICAL DATA:  Chest pain EXAM: CHEST  2 VIEW COMPARISON:  01/03/2016 FINDINGS: The lungs appear clear.  Cardiac and mediastinal contours normal. No  pleural effusion identified. Lower thoracic spondylosis. IMPRESSION: 1.  No active cardiopulmonary disease is radiographically apparent. 2. Lower thoracic spondylosis. Electronically Signed   By: Van Clines M.D.   On: 03/04/2017 08:35    Cardiac Studies   Cath: 03/04/17  Conclusion    Moderate to severe three-vessel coronary disease.  Native right coronary and LAD are heavily calcified.  Distal left main 25-30% narrowing.  LAD proximal to mid diffuse calcified 60-70% narrowing.  High-grade obstruction in the circumflex/marginal with greater than 90% obstruction.  This lesion is beyond a tortuous segment which contains previously placed stents.  The more proximal stent contains diffuse 40% ISR.  Native right coronary is moderately to heavily calcified from proximal to mid vessel.  The mid vessel beyond the first  acute marginal branch contains eccentric 75-80% narrowing  Left ventricular end-diastolic pressure is normal.  The anterior wall and parts of the inferior wall and noted to move normally by hand injection.  Estimated ejection fraction is over 50%.  RECOMMENDATIONS:   Continue Plavix  Most likely treatment strategy should be PCI of the culprit lesion in the circumflex.  Tortuosity will make the procedure difficult from the standpoint of stent delivery.  Recommend performing the procedure from femoral approach given difficulties with catheter control from the right arm.  May need a catheter extension device to deliver the stent in the circumflex.  If the circumflex goes well without significant contrast or time required, consider stenting the mid to distal RCA.  An alternative approach may be to perform FFR on the proximal to mid LAD and if positive, consider surgical revascularization.  Will discuss with colleagues.   Patient Profile     77 y.o. male with PMH of CAD s/p Lcx ('09), HTN, HL, and remote hx of tobacco use who presented with chest pain/NSTEMI.   Assessment & Plan    1. NSTEMI: Troponin peaked at 1.44. Underwent cath yesterday with lesion in the Lcx but unable to intervene given the tortuosity of vessel from radial access. Planned for cath today from femoral approach. Possible intervention to the dRCA.  -- remains on IV heparin, ASA, statin  2. HTN: Appears was on coreg in the past but was stopped. He is unsure of why this was.  -- add low dose metoprolol, continue amlodipine  3. HL: LDL 92, added back high dose statin  Signed, Reino Bellis, NP  03/05/2017, 7:47 AM  Pager # [phone]   For questions or updates, please contact [place] Please consult [email or web address] for contact info under Cardiology/STEMI.    Agree with note by Reino Bellis NP-C  Mr. Mayabb and had radial cath yesterday by Dr. Tamala Julian revealing significant disease in the distal RCA and mid  circumflex. Because of tortuosity in the subclavian system Dr. Tamala Julian did not feel that he could get adequate guide support for intervention. He is on IV heparin. He did have an episode of chest pain this morning. He scheduled for PCI and stenting via the femoral approach today.  Lorretta Harp, M.D., [place], [facility], [place] [address] [place], [place]  [address]  [phone] 03/05/2017 9:32 AM

## 2017-03-05 NOTE — Progress Notes (Signed)
Attempted echo at 10:10 am. Patient to cath lab.

## 2017-03-06 ENCOUNTER — Inpatient Hospital Stay (HOSPITAL_COMMUNITY): Payer: PPO

## 2017-03-06 DIAGNOSIS — E785 Hyperlipidemia, unspecified: Secondary | ICD-10-CM

## 2017-03-06 DIAGNOSIS — I10 Essential (primary) hypertension: Secondary | ICD-10-CM

## 2017-03-06 DIAGNOSIS — I361 Nonrheumatic tricuspid (valve) insufficiency: Secondary | ICD-10-CM

## 2017-03-06 HISTORY — DX: Essential (primary) hypertension: I10

## 2017-03-06 LAB — CBC
HEMATOCRIT: 42.6 % (ref 39.0–52.0)
HEMOGLOBIN: 14.6 g/dL (ref 13.0–17.0)
MCH: 34 pg (ref 26.0–34.0)
MCHC: 34.3 g/dL (ref 30.0–36.0)
MCV: 99.3 fL (ref 78.0–100.0)
Platelets: 143 10*3/uL — ABNORMAL LOW (ref 150–400)
RBC: 4.29 MIL/uL (ref 4.22–5.81)
RDW: 12.2 % (ref 11.5–15.5)
WBC: 8.5 10*3/uL (ref 4.0–10.5)

## 2017-03-06 LAB — BASIC METABOLIC PANEL
ANION GAP: 9 (ref 5–15)
BUN: 14 mg/dL (ref 6–20)
CHLORIDE: 108 mmol/L (ref 101–111)
CO2: 23 mmol/L (ref 22–32)
Calcium: 8.6 mg/dL — ABNORMAL LOW (ref 8.9–10.3)
Creatinine, Ser: 0.94 mg/dL (ref 0.61–1.24)
GFR calc non Af Amer: >60 mL/min (ref >60)
Glucose, Bld: 110 mg/dL — ABNORMAL HIGH (ref 65–99)
POTASSIUM: 3.7 mmol/L (ref 3.5–5.1)
SODIUM: 140 mmol/L (ref 135–145)

## 2017-03-06 LAB — ECHOCARDIOGRAM COMPLETE
Height: 75 in
Weight: 3403.9 oz

## 2017-03-06 MED ORDER — ANGIOPLASTY BOOK
Freq: Once | Status: AC
  Administered 2017-03-06: 03:00:00
  Filled 2017-03-06: qty 1

## 2017-03-06 MED ORDER — METOPROLOL TARTRATE 25 MG PO TABS
25.0000 mg | ORAL_TABLET | Freq: Two times a day (BID) | ORAL | Status: DC
  Administered 2017-03-06: 10:00:00 25 mg via ORAL
  Filled 2017-03-06: qty 1

## 2017-03-06 MED ORDER — GUAIFENESIN-DM 100-10 MG/5ML PO SYRP
5.0000 mL | ORAL_SOLUTION | ORAL | Status: DC | PRN
  Administered 2017-03-06: 5 mL via ORAL
  Filled 2017-03-06: qty 5

## 2017-03-06 MED ORDER — HEART ATTACK BOUNCING BOOK
Freq: Once | Status: AC
  Administered 2017-03-06: 03:00:00
  Filled 2017-03-06: qty 1

## 2017-03-06 MED ORDER — METOPROLOL TARTRATE 25 MG PO TABS: 25.0000 mg | ORAL_TABLET | Freq: Two times a day (BID) | ORAL | 2 refills | Status: DC

## 2017-03-06 MED ORDER — ATORVASTATIN CALCIUM 80 MG PO TABS: 80.0000 mg | ORAL_TABLET | Freq: Every day | ORAL | 0 refills | Status: DC

## 2017-03-06 MED FILL — Heparin Sodium (Porcine) 2 Unit/ML in Sodium Chloride 0.9%: INTRAMUSCULAR | Qty: 1000 | Status: AC

## 2017-03-06 MED FILL — Lidocaine HCl Local Inj 1%: INTRAMUSCULAR | Qty: 20 | Status: AC

## 2017-03-06 NOTE — Progress Notes (Signed)
CARDIAC REHAB PHASE I   PRE:  Rate/Rhythm: 85 SR    BP: sitting 161/94    SaO2:   MODE:  Ambulation: 420 ft   POST:  Rate/Rhythm: 105 ST with PACs    BP: sitting 166/87     SaO2:   Tolerated well, no c/o. Ed completed with pt and wife. Good reception, they try to be compliant. Understands importance of Plavix. Will refer to [place], ACSM 03/06/2017 9:37 AM

## 2017-03-06 NOTE — Consult Note (Signed)
            [facility] CM Primary Care Navigator  03/06/2017  [name] [name] 1941-01-16 [id number]   Seen patient and wife [name] [name]) at the bedsideto identify possible discharge needs. Patient reports having "chest pain" that had led to this admission.  Patient endorsesDr. Jenean Lindau with [facility] as the primary care provider.   [facility] [place] [facility] [place] [facility] [place] to obtain medications without any problem.   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 His wife will be able to providetransportation to hisdoctors'appointments after discharge.  Patient lives with wife at home and she will serve as his primary caregiver after discharge.   Anticipated discharge plan ishome per patient.  Patient and wife voiced understanding to call primary care provider's office whenpatient returns home, for a post discharge follow-up appointment within1-2 weeksor sooner if needed.Patient letter (with PCP's contact number) was provided astheirreminder.  Explained to patient and wife regarding [facility] CM services available for health managementat homebut he denies any needs or concerns as thispoint.  Patient and wife voiced understandingto seek referral from primary care provider to [facility] care management ifnecessaryand deemed appropriate for any services in the future.  [facility] care management information provided for future needs thatpatient may have.  Patient however, hadverbally agreed and opted forEMMIcalls tofollowup his recoveryat home.  Referral made for [facility] General calls after discharge.   For additional questions please contact:  Edwena Felty A. Joanna Hall, BSN, RN-BC [facility] PRIMARY CARE Navigator Cell: [phone]

## 2017-03-06 NOTE — Progress Notes (Signed)
  Echocardiogram 2D Echocardiogram has been performed.  [name] [name] 03/06/2017, 8:25 AM

## 2017-03-06 NOTE — Discharge Summary (Signed)
Discharge Summary    Patient ID: [name] [name],  MRN: [id number], DOB/AGE: 1940-09-02 77 y.o.  Admit date: 03/04/2017 Discharge date: 03/06/2017  Primary Care Provider: Helen Hashimoto. Primary Cardiologist: [facility]  Discharge Diagnoses    Principal Problem:   NSTEMI (non-ST elevated myocardial infarction) [facility]) Active Problems:   Acute myocardial infarction, subendocardial infarction, episode of care unspecified   Coronary artery disease involving native coronary artery of native heart with unstable angina pectoris ([place])   Hyperlipidemia   Hypertension   Allergies Allergies  Allergen Reactions  . Ativan [Lorazepam] Other (See Comments)    "Sees things."    Diagnostic Studies/Procedures    Cath: 03/04/17   Moderate to severe three-vessel coronary disease.  Native right coronary and LAD are heavily calcified.  Distal left main 25-30% narrowing.  LAD proximal to mid diffuse calcified 60-70% narrowing.  High-grade obstruction in the circumflex/marginal with greater than 90% obstruction.  This lesion is beyond a tortuous segment which contains previously placed stents.  The more proximal stent contains diffuse 40% ISR.  Native right coronary is moderately to heavily calcified from proximal to mid vessel.  The mid vessel beyond the first acute marginal branch contains eccentric 75-80% narrowing  Left ventricular end-diastolic pressure is normal.  The anterior wall and parts of the inferior wall and noted to move normally by hand injection.  Estimated ejection fraction is over 50%.  RECOMMENDATIONS:   Continue Plavix  Most likely treatment strategy should be PCI of the culprit lesion in the circumflex.  Tortuosity will make the procedure difficult from the standpoint of stent delivery.  Recommend performing the procedure from femoral approach given difficulties with catheter control from the right arm.  May need a catheter extension device to deliver the stent in  the circumflex.  If the circumflex goes well without significant contrast or time required, consider stenting the mid to distal RCA.  An alternative approach may be to perform FFR on the proximal to mid LAD and if positive, consider surgical revascularization.  Will discuss with colleagues.   Cath: 03/05/17      Mid LM lesion is 25% stenosed.  Prox LAD to Mid LAD lesion is 55% stenosed.  Ost 1st Mrg lesion is 40% stenosed.  1st Mrg lesion is 95% stenosed.  A drug-eluting stent was successfully placed using a STENT SYNERGY DES 3.5X16.  Post intervention, there is a 0% residual stenosis.  Mid RCA to Dist RCA lesion is 75% stenosed.  A drug-eluting stent was successfully placed using a STENT SYNERGY DES 2.75X12.  Post intervention, there is a 0% residual stenosis.  Prox RCA to Mid RCA lesion is 30% stenosed.   1. Successful stenting of the mid LCx with DES 2. Successful stenting of the mid to distal RCA with DES.   Plan: DAPT for at least one year. Anticipate DC in am.     _____________   History of Present Illness     77 yo male with PMH of CAD s/p stenting Lcx ('09), HTN, HL, and remote hx of tobacco use. He is followed by Dr. Bettina Gavia as an outpatient. Was last seen in the office back in 4/18 and reported shortness of breath at that time. Underwent stress testing that was normal per his report, but unable to find results in Care Everywhere. He has been on plavix since 2009. Hx of HL but does not take statins, stating "he does not believe in them". On amlodipine for hypertension. Fairly active, works 2  days a week outside the home. Works in the yard and around his home regularly.  The morning of admission he woke up around 6:30am and developed right sided chest pain with radiation to the left chest and arm. Went in the kitchen and fixed breakfast but did not eat. Walked into the living room and sat down. Continued to have symptoms, and told his wife. Wife reported he was  clammy, pale and weak. Symptoms are just like what he had with previous STEMI back in 2009. Took a nitro at home with some improvement. Wife called EMS. Given 324 ASA, and additional nitro. In the ED his symptoms resolved.   Labs showed stable electrolytes, with exception K+ 3.4, Hgb 15.0. EKG showed SR without acute ischemic changes. Trop was 0.00>>0.25. Placed on heparin in the ED.   Hospital Course     Given his symptoms he was admitted for further work up and taken for cardiac cath. Cath with Dr. Tamala Julian showed high grade lesion in the Lcx, and RCA but unable to intervention given the tortuosity of the vessel. He was hydrated and taken back to cath the following day with a femoral approach. Successful PCI/DES to the mLCx and m/dRCA. Plan for DAPT with ASA/plavix for at least one year. LDL 92, and was started on high dose statin. Also added metoprolol with blood pressures tolerating. Labs were stable post cath. No further chest discomfort. Worked well with cardiac rehab.  General: Well developed, well nourished, male appearing in no acute distress. Head: Normocephalic, atraumatic.  Neck: Supple without bruits, JVD. Lungs:  Resp regular and unlabored, CTA. Heart: RRR, S1, S2, no S3, S4, or murmur; no rub. Abdomen: Soft, non-tender, non-distended with normoactive bowel sounds. No hepatomegaly. No rebound/guarding. No obvious abdominal masses. Extremities: No clubbing, cyanosis, edema. Distal pedal pulses are 2+ bilaterally. R radial/femoral cath site stable without bruising or hematoma Neuro: Alert and oriented X 3. Moves all extremities spontaneously. Psych: Normal affect.  [name] [name] was seen by Dr. Gwenlyn Found and determined stable for discharge home. Follow up in the office has been arranged. Medications are listed below.   _____________  Discharge Vitals Blood pressure (!) 166/87, pulse 90, temperature 98.3 F (36.8 C), temperature source Oral, resp. rate (!) 27, height 6\' 3"  (1.905 m),  weight 212 lb 11.9 oz (96.5 kg), SpO2 95 %.  Filed Weights   03/04/17 0820 03/05/17 0321 03/06/17 0334  Weight: 214 lb (97.1 kg) 214 lb 15.2 oz (97.5 kg) 212 lb 11.9 oz (96.5 kg)    Labs & Radiologic Studies    CBC Recent Labs    03/05/17 0350 03/06/17 0314  WBC 6.5 8.5  HGB 15.0 14.6  HCT 44.0 42.6  MCV 100.0 99.3  PLT 147* 878*   Basic Metabolic Panel Recent Labs    03/04/17 2231 03/05/17 0350 03/06/17 0314  NA 142 139 140  K 4.0 4.2 3.7  CL 108 107 108  CO2 24 21* 23  GLUCOSE 134* 99 110*  BUN 18 18 14   CREATININE 1.03 0.93 0.94  CALCIUM 8.8* 8.7* 8.6*  MG 1.9  --   --    Liver Function Tests No results for input(s): AST, ALT, ALKPHOS, BILITOT, PROT, ALBUMIN in the last 72 hours. No results for input(s): LIPASE, AMYLASE in the last 72 hours. Cardiac Enzymes Recent Labs    03/04/17 1309 03/04/17 1848 03/05/17 0350  TROPONINI 0.89* 1.44* 0.76*   BNP Invalid input(s): POCBNP D-Dimer No results for input(s): DDIMER in the last  72 hours. Hemoglobin A1C Recent Labs    03/05/17 0350  HGBA1C 5.2   Fasting Lipid Panel Recent Labs    03/05/17 0350  CHOL 151  HDL 35*  LDLCALC 92  TRIG 122  CHOLHDL 4.3   Thyroid Function Tests No results for input(s): TSH, T4TOTAL, T3FREE, THYROIDAB in the last 72 hours.  Invalid input(s): FREET3 _____________  Dg Chest 2 View  Result Date: 03/04/2017 CLINICAL DATA:  Chest pain EXAM: CHEST  2 VIEW COMPARISON:  01/03/2016 FINDINGS: The lungs appear clear.  Cardiac and mediastinal contours normal. No pleural effusion identified. Lower thoracic spondylosis. IMPRESSION: 1.  No active cardiopulmonary disease is radiographically apparent. 2. Lower thoracic spondylosis. Electronically Signed   By: Van Clines M.D.   On: 03/04/2017 08:35   Disposition   Pt is being discharged home today in good condition.  Follow-up Plans & Appointments    Follow-up Information    Richardo Priest, MD Follow up on 03/15/2017.     Specialty:  Cardiology Why:  at 8am for your follow up appt.  Contact information: [place] [address] [phone]          Discharge Instructions    Amb Referral to Cardiac Rehabilitation   Complete by:  As directed    Diagnosis:   Coronary Stents NSTEMI PTCA     Call MD for:  redness, tenderness, or signs of infection (pain, swelling, redness, odor or green/yellow discharge around incision site)   Complete by:  As directed    Diet - low sodium heart healthy   Complete by:  As directed    Discharge instructions   Complete by:  As directed    Groin Site Care Refer to this sheet in the next few weeks. These instructions provide you with information on caring for yourself after your procedure. Your caregiver may also give you more specific instructions. Your treatment has been planned according to current medical practices, but problems sometimes occur. Call your caregiver if you have any problems or questions after your procedure. HOME CARE INSTRUCTIONS You may shower 24 hours after the procedure. Remove the bandage (dressing) and gently wash the site with plain soap and water. Gently pat the site dry.  Do not apply powder or lotion to the site.  Do not sit in a bathtub, swimming pool, or whirlpool for 5 to 7 days.  No bending, squatting, or lifting anything over 10 pounds (4.5 kg) as directed by your caregiver.  Inspect the site at least twice daily.  Do not drive home if you are discharged the same day of the procedure. Have someone else drive you.  You may drive 24 hours after the procedure unless otherwise instructed by your caregiver.  What to expect: Any bruising will usually fade within 1 to 2 weeks.  Blood that collects in the tissue (hematoma) may be painful to the touch. It should usually decrease in size and tenderness within 1 to 2 weeks.  SEEK IMMEDIATE MEDICAL CARE IF: You have unusual pain at the groin site or down the affected  leg.  You have redness, warmth, swelling, or pain at the groin site.  You have drainage (other than a small amount of blood on the dressing).  You have chills.  You have a fever or persistent symptoms for more than 72 hours.  You have a fever and your symptoms suddenly get worse.  Your leg becomes pale, cool, tingly, or numb.  You have heavy  bleeding from the site. Hold pressure on the site. .  Radial Site Care Refer to this sheet in the next few weeks. These instructions provide you with information on caring for yourself after your procedure. Your caregiver may also give you more specific instructions. Your treatment has been planned according to current medical practices, but problems sometimes occur. Call your caregiver if you have any problems or questions after your procedure. HOME CARE INSTRUCTIONS You may shower the day after the procedure.Remove the bandage (dressing) and gently wash the site with plain soap and water.Gently pat the site dry.  Do not apply powder or lotion to the site.  Do not submerge the affected site in water for 3 to 5 days.  Inspect the site at least twice daily.  Do not flex or bend the affected arm for 24 hours.  No lifting over 5 pounds (2.3 kg) for 5 days after your procedure.  Do not drive home if you are discharged the same day of the procedure. Have someone else drive you.  You may drive 24 hours after the procedure unless otherwise instructed by your caregiver.  What to expect: Any bruising will usually fade within 1 to 2 weeks.  Blood that collects in the tissue (hematoma) may be painful to the touch. It should usually decrease in size and tenderness within 1 to 2 weeks.  SEEK IMMEDIATE MEDICAL CARE IF: You have unusual pain at the radial site.  You have redness, warmth, swelling, or pain at the radial site.  You have drainage (other than a small amount of blood on the dressing).  You have chills.  You have a fever or persistent symptoms for more  than 72 hours.  You have a fever and your symptoms suddenly get worse.  Your arm becomes pale, cool, tingly, or numb.  You have heavy bleeding from the site. Hold pressure on the site.   PLEASE DO NOT MISS ANY DOSES OF YOUR PLAVIX!!!!! Also keep a log of you blood pressures and bring back to your follow up appt. Please call the office with any questions.   Patients taking blood thinners should generally stay away from medicines like ibuprofen, Advil, Motrin, naproxen, and Aleve due to risk of stomach bleeding. You may take Tylenol as directed or talk to your primary doctor about alternatives.   Increase activity slowly   Complete by:  As directed       Discharge Medications     Medication List    STOP taking these medications   oxyCODONE-acetaminophen 5-325 MG tablet Commonly known as:  PERCOCET   senna-docusate 8.6-50 MG tablet Commonly known as:  SENOKOT S     TAKE these medications   amLODipine 5 MG tablet Commonly known as:  NORVASC Take 5 mg by mouth every morning.   aspirin 81 MG tablet Take 81 mg by mouth daily as needed for pain.   atorvastatin 80 MG tablet Commonly known as:  LIPITOR Take 1 tablet (80 mg total) by mouth daily at 6 PM.   clopidogrel 75 MG tablet Commonly known as:  PLAVIX Take 75 mg by mouth daily.   LUBRICATING EYE DROPS OP Place 1 drop into both eyes daily as needed (dry eyes).   metoprolol tartrate 25 MG tablet Commonly known as:  LOPRESSOR Take 1 tablet (25 mg total) by mouth 2 (two) times daily.   nitroGLYCERIN 0.4 MG SL tablet Commonly known as:  NITROSTAT Place 0.4 mg under the tongue every 5 (five) minutes as needed  for chest pain.   NYQUIL COLD & FLU PO Take 15 mLs by mouth at bedtime as needed (cough/cold).        Aspirin prescribed at discharge?  Yes High Intensity Statin Prescribed? (Lipitor 40-80mg  or Crestor 20-40mg ): Yes Beta Blocker Prescribed? Yes For EF <40%, was ACEI/ARB Prescribed? No: EF ok ADP Receptor  Inhibitor Prescribed? (i.e. Plavix etc.-Includes Medically Managed Patients): Yes For EF <40%, Aldosterone Inhibitor Prescribed? No: EF ok Was EF assessed during THIS hospitalization? Yes Was Cardiac Rehab II ordered? (Included Medically managed Patients): Yes   Outstanding Labs/Studies   FLP/LFTs in 6 weeks if tolerating statin.   Duration of Discharge Encounter   Greater than 30 minutes including physician time.  Signed, Reino Bellis NP-C 03/06/2017, 12:22 PM

## 2017-03-11 DIAGNOSIS — C61 Malignant neoplasm of prostate: Secondary | ICD-10-CM | POA: Insufficient documentation

## 2017-03-11 DIAGNOSIS — Q283 Other malformations of cerebral vessels: Secondary | ICD-10-CM | POA: Insufficient documentation

## 2017-03-13 DIAGNOSIS — I214 Non-ST elevation (NSTEMI) myocardial infarction: Secondary | ICD-10-CM | POA: Diagnosis not present

## 2017-03-13 DIAGNOSIS — I2510 Atherosclerotic heart disease of native coronary artery without angina pectoris: Secondary | ICD-10-CM | POA: Diagnosis not present

## 2017-03-13 DIAGNOSIS — Z1331 Encounter for screening for depression: Secondary | ICD-10-CM | POA: Diagnosis not present

## 2017-03-13 DIAGNOSIS — Z79899 Other long term (current) drug therapy: Secondary | ICD-10-CM | POA: Diagnosis not present

## 2017-03-13 DIAGNOSIS — Z6826 Body mass index (BMI) 26.0-26.9, adult: Secondary | ICD-10-CM | POA: Diagnosis not present

## 2017-03-13 DIAGNOSIS — I252 Old myocardial infarction: Secondary | ICD-10-CM | POA: Diagnosis not present

## 2017-03-15 ENCOUNTER — Ambulatory Visit (INDEPENDENT_AMBULATORY_CARE_PROVIDER_SITE_OTHER): Payer: PPO | Admitting: Cardiology

## 2017-03-15 ENCOUNTER — Encounter: Payer: Self-pay | Admitting: Cardiology

## 2017-03-15 VITALS — BP 138/78 | HR 70 | Ht 75.0 in | Wt 213.4 lb

## 2017-03-15 DIAGNOSIS — I25110 Atherosclerotic heart disease of native coronary artery with unstable angina pectoris: Secondary | ICD-10-CM

## 2017-03-15 DIAGNOSIS — I493 Ventricular premature depolarization: Secondary | ICD-10-CM | POA: Diagnosis not present

## 2017-03-15 DIAGNOSIS — I10 Essential (primary) hypertension: Secondary | ICD-10-CM | POA: Diagnosis not present

## 2017-03-15 DIAGNOSIS — E785 Hyperlipidemia, unspecified: Secondary | ICD-10-CM | POA: Diagnosis not present

## 2017-03-15 HISTORY — DX: Ventricular premature depolarization: I49.3

## 2017-03-15 MED ORDER — NITROGLYCERIN 0.4 MG SL SUBL: 0.4000 mg | SUBLINGUAL_TABLET | SUBLINGUAL | 11 refills | Status: AC | PRN

## 2017-03-15 MED ORDER — ATORVASTATIN CALCIUM 80 MG PO TABS: 80.0000 mg | ORAL_TABLET | Freq: Every day | ORAL | 3 refills | Status: DC

## 2017-03-15 MED ORDER — METOPROLOL TARTRATE 25 MG PO TABS: 25.0000 mg | ORAL_TABLET | Freq: Two times a day (BID) | ORAL | 3 refills | Status: AC

## 2017-03-15 NOTE — Progress Notes (Signed)
Cardiology Office Note:    Date:  03/15/2017   ID:  [name] [name], DOB 12-06-1940, MRN [id number]  PCP:  Helen Hashimoto., MD  Cardiologist:  Shirlee More, MD    Referring MD: Helen Hashimoto., MD    ASSESSMENT:    1. Coronary artery disease involving native coronary artery of native heart with unstable angina pectoris ([place])   2. Essential hypertension   3. Hyperlipidemia, unspecified hyperlipidemia type   4. PVCs (premature ventricular contractions)    PLAN:    In order of problems listed above:  1. Stable after recent non-ST elevation MI with PCI and stenting ejection fraction is low normal he will continue dual antiplatelet therapy lipid-lowering treatment enter cardiac rehabilitation.  Check lipid profile today goal LDL is less than 50 2. Stable continue current treatment 3. Continue current lipid-lowering treatment may well require additional therapy with Zetia or PCSK9 to achieve goals   Next appointment: 3 months   Medication Adjustments/Labs and Tests Ordered: Current medicines are reviewed at length with the patient today.  Concerns regarding medicines are outlined above.  Orders Placed This Encounter  Procedures  . Lipid Profile  . Comprehensive Metabolic Panel (CMET)  . EKG 12-Lead   Meds ordered this encounter  Medications  . metoprolol tartrate (LOPRESSOR) 25 MG tablet    Sig: Take 1 tablet (25 mg total) by mouth 2 (two) times daily.    Dispense:  180 tablet    Refill:  3  . atorvastatin (LIPITOR) 80 MG tablet    Sig: Take 1 tablet (80 mg total) by mouth daily at 6 PM.    Dispense:  90 tablet    Refill:  3  . nitroGLYCERIN (NITROSTAT) 0.4 MG SL tablet    Sig: Place 1 tablet (0.4 mg total) under the tongue every 5 (five) minutes as needed for chest pain.    Dispense:  25 tablet    Refill:  11    Chief Complaint  Patient presents with  . Hospitalization Follow-up  . Coronary Artery Disease  . Hypertension  . Hyperlipidemia    History  of Present Illness:    [name] [name] is a 77 y.o. male with a hx of CAD, Dyslipidemia, HTN, S/P PCI of LCF 04/15/07 for STEMI and symptomatic hypotension last seen by me 04/25/16. He recently presented 03/04/17 with non STEMI with PCI and stent of mid LCF and OM1.  Admit date: 03/04/2017 Discharge date: 03/06/2017 Primary Care Provider: Helen Hashimoto. Primary Cardiologist: [facility] Discharge Diagnoses   Principal Problem:   NSTEMI (non-ST elevated myocardial infarction) [facility]) Active Problems:   Acute myocardial infarction, subendocardial infarction, episode of care unspecified   Coronary artery disease involving native coronary artery of native heart with unstable angina pectoris ([place])   Hyperlipidemia   Hypertension Cath: 03/05/17   Mid LM lesion is 25% stenosed.  Prox LAD to Mid LAD lesion is 55% stenosed.  Ost 1st Mrg lesion is 40% stenosed.  1st Mrg lesion is 95% stenosed.  A drug-eluting stent was successfully placed using a STENT SYNERGY DES 3.5X16.  Post intervention, there is a 0% residual stenosis.  Mid RCA to Dist RCA lesion is 75% stenosed.  A drug-eluting stent was successfully placed using a STENT SYNERGY DES 2.75X12.  Post intervention, there is a 0% residual stenosis.  Prox RCA to Mid RCA lesion is 30% stenosed.  1. Successful stenting of the mid LCx with DES 2. Successful stenting of the mid to distal RCA with DES.  Echo TTE 03/06/17: Study Conclusions - Left ventricle: The cavity size was normal. Wall thickness was   increased in a pattern of mild LVH. Systolic function was normal.   The estimated ejection fraction was in the range of 50% to 55%.   Wall motion was normal; there were no regional wall motion   abnormalities. Doppler parameters are consistent with abnormal   left ventricular relaxation (grade 1 diastolic dysfunction). - Aortic valve: There was trivial regurgitation. - Right atrium: The atrium was moderately dilated. - Tricuspid  valve: There was mild-moderate regurgitation. - Pulmonary arteries:  PA peak pressure: 40 mm Hg (S)  Compliance with diet, lifestyle and medications: Yes, previously had stopped his statin understands is necessary and agrees to lipid-lowering treatment.  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  He is pleased with the quality of his life has had no chest pain shortness of breath palpitation or syncope. Past Medical History:  Diagnosis Date  . Acute myocardial infarction, subendocardial infarction, episode of care unspecified   . Arthritis    "left pinky" (03/04/2017)  . Cerebral vascular malformation    PT'S NEUROLOGIST IS DR. 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 Coronary artery disease    HX OF MI, RIGHT BUNDLE BRANCH BLOCK  . Coronary artery disease involving native coronary artery of native heart with unstable angina pectoris ([place])   . Essential hypertension 03/06/2017  . Gallstones    ONE SEVERE EPISODE OF ABDOMINAL PAIN  . Headache(784.0)    POSSIBLE TRIGEMINAL NEURALGIA   . Heart murmur    "so they say when I went into the service"  . Hyperlipidemia   . Hypertension   . Mass of right kidney    FOUND ON WORK UP FOR ABD PAIN  . Myocardial infarction ([place]) 04-14-07  . NSTEMI (non-ST elevated myocardial infarction) ([place]) 03/04/2017   [name] 03/04/2017  . Prostate cancer ([place])   . Umbilical hernia     Past Surgical History:  Procedure Laterality Date  . CARDIAC CATHETERIZATION  03/04/2017  . CHOLECYSTECTOMY N/A 09/19/2012   Procedure: ROBOTIC ASSISTED LAPAROSCOPIC CHOLECYSTECTOMY WITH UMBILICAL HERNIA REPAIR;  Surgeon: Leighton Ruff, MD;  Location: [facility];  Service: General;  Laterality: N/A;  . CORONARY ANGIOPLASTY WITH STENT PLACEMENT  04/14/07  . CORONARY STENT INTERVENTION N/A 03/05/2017   Procedure: CORONARY STENT  INTERVENTION;  Surgeon: Martinique, Peter M, MD;  Location: [place] [facility];  Service: Cardiovascular;  Laterality: N/A;  . JOINT REPLACEMENT    . LEFT HEART CATH AND CORONARY ANGIOGRAPHY N/A 03/04/2017   Procedure: LEFT HEART CATH AND CORONARY ANGIOGRAPHY;  Surgeon: Belva Crome, MD;  Location: [place] [facility];  Service: Cardiovascular;  Laterality: N/A;  . RADIOACTIVE SEED IMPLANT FOR PROSTATE CANCER  2 22 2006  . REPLACEMENT TOTAL KNEE Left 05/30/2010  . ROBOTIC ASSITED PARTIAL NEPHRECTOMY Right 09/19/2012   Procedure: ROBOTIC ASSITED PARTIAL NEPHRECTOMY;  Surgeon: Molli Hazard, MD;  [facility];  Service: Urology;  Laterality: Right;  . SHOULDER OPEN ROTATOR CUFF REPAIR Left 03/30/2008  . TOTAL HIP ARTHROPLASTY Right 01/2001  . TOTAL HIP REVISION  07/2008   acetabular revision  . ULTRASOUND GUIDANCE FOR VASCULAR ACCESS  03/05/2017   Procedure: Ultrasound Guidance For Vascular Access;  Surgeon: Martinique, Peter M, MD;  Location: [place] [facility];  Service: Cardiovascular;;  Current Medications: Current Meds  Medication Sig  . amLODipine (NORVASC) 5 MG tablet Take 5 mg by mouth every morning.   Marland Kitchen aspirin 81 MG tablet Take 81 mg by mouth daily as needed for pain.   Marland Kitchen atorvastatin (LIPITOR) 80 MG tablet Take 1 tablet (80 mg total) by mouth daily at 6 PM.  . Carboxymethylcellul-Glycerin (LUBRICATING EYE DROPS OP) Place 1 drop into both eyes daily as needed (dry eyes).  . clopidogrel (PLAVIX) 75 MG tablet Take 75 mg by mouth daily.  Marland Kitchen DM-Doxylamine-Acetaminophen (NYQUIL COLD & FLU PO) Take 15 mLs by mouth at bedtime as needed (cough/cold).  . metoprolol tartrate (LOPRESSOR) 25 MG tablet Take 1 tablet (25 mg total) by mouth 2 (two) times daily.  . nitroGLYCERIN (NITROSTAT) 0.4 MG SL tablet Place 1 tablet (0.4 mg total) under the tongue every 5 (five) minutes as needed for chest pain.  . [DISCONTINUED] atorvastatin (LIPITOR) 80 MG tablet Take 1 tablet (80 mg total) by mouth  daily at 6 PM.  . [DISCONTINUED] metoprolol tartrate (LOPRESSOR) 25 MG tablet Take 1 tablet (25 mg total) by mouth 2 (two) times daily.  . [DISCONTINUED] nitroGLYCERIN (NITROSTAT) 0.4 MG SL tablet Place 0.4 mg under the tongue every 5 (five) minutes as needed for chest pain.     Allergies:   Ativan [lorazepam]   Social History   Socioeconomic History  . Marital status: Married    Spouse name: None  . Number of children: None  . Years of education: None  . Highest education level: None  Social Needs  . Financial resource strain: None  . Food insecurity - worry: None  . Food insecurity - inability: None  . Transportation needs - medical: None  . Transportation needs - non-medical: None  Occupational History  . None  Tobacco Use  . Smoking status: Former Smoker    Packs/day: 1.00    Years: 30.00    Pack years: 30.00    Types: Cigarettes  . Smokeless tobacco: Never Used  . Tobacco comment: "stopped ~ 1980"  Substance and Sexual Activity  . Alcohol use: No  . Drug use: No  . Sexual activity: None  Other Topics Concern  . None  Social History Narrative  . None     Family History: The patient's family history includes Heart attack in his mother. ROS:   Please see the history of present illness.    All other systems reviewed and are negative.  EKGs/Labs/Other Studies Reviewed:    The following studies were reviewed today:  EKG:  EKG ordered today.  The ekg ordered today demonstrates sinus rhythm normal  Recent Labs: 03/04/2017: Magnesium 1.9 03/06/2017: BUN 14; Creatinine, Ser 0.94; Hemoglobin 14.6; Platelets 143; Potassium 3.7; Sodium 140  Recent Lipid Panel    Component Value Date/Time   CHOL 151 03/05/2017 0350   TRIG 122 03/05/2017 0350   HDL 35 (L) 03/05/2017 0350   CHOLHDL 4.3 03/05/2017 0350   VLDL 24 03/05/2017 0350   LDLCALC 92 03/05/2017 0350    Physical Exam:    VS:  BP 138/78 (BP Location: Right Arm, Patient Position: Sitting, Cuff Size: Large)    Pulse 70   Ht 6\' 3"  (1.905 m)   Wt 213 lb 6.4 oz (96.8 kg)   SpO2 98%   BMI 26.67 kg/m     Wt Readings from Last 3 Encounters:  03/15/17 213 lb 6.4 oz (96.8 kg)  03/06/17 212 lb 11.9 oz (96.5 kg)  10/22/12 196 lb 9.6 oz (  89.2 kg)     GEN:  Well nourished, well developed in no acute distress HEENT: Normal NECK: No JVD; No carotid bruits LYMPHATICS: No lymphadenopathy CARDIAC: RRR, no murmurs, rubs, gallops RESPIRATORY:  Clear to auscultation without rales, wheezing or rhonchi  ABDOMEN: Soft, non-tender, non-distended MUSCULOSKELETAL:  No edema; No deformity  SKIN: Warm and dry NEUROLOGIC:  Alert and oriented x 3 PSYCHIATRIC:  Normal affect    Signed, Shirlee More, MD  03/15/2017 11:33 AM    [place]

## 2017-03-15 NOTE — Patient Instructions (Signed)
Medication Instructions:  Your physician recommends that you continue on your current medications as directed. Please refer to the Current Medication list given to you today.  Labwork: Your physician recommends that you return for lab work in: today. CMP, lipid  Testing/Procedures: You had an EKG today.  Follow-Up: Your physician wants you to follow-up in: 3 months. You will receive a reminder letter in the mail two months in advance. If you don't receive a letter, please call our office to schedule the follow-up appointment.  Any Other Special Instructions Will Be Listed Below (If Applicable).     If you need a refill on your cardiac medications before your next appointment, please call your pharmacy.

## 2017-03-16 LAB — COMPREHENSIVE METABOLIC PANEL
ALT: 27 IU/L (ref 0–44)
AST: 23 IU/L (ref 0–40)
Albumin/Globulin Ratio: 1.4 (ref 1.2–2.2)
Albumin: 4.1 g/dL (ref 3.5–4.8)
Alkaline Phosphatase: 74 IU/L (ref 39–117)
BUN/Creatinine Ratio: 13 (ref 10–24)
BUN: 14 mg/dL (ref 8–27)
Bilirubin Total: 0.7 mg/dL (ref 0.0–1.2)
CALCIUM: 9.4 mg/dL (ref 8.6–10.2)
CO2: 24 mmol/L (ref 20–29)
CREATININE: 1.07 mg/dL (ref 0.76–1.27)
Chloride: 103 mmol/L (ref 96–106)
GFR calc Af Amer: 78 mL/min/{1.73_m2} (ref >59)
GFR, EST NON AFRICAN AMERICAN: 68 mL/min/{1.73_m2} (ref >59)
GLOBULIN, TOTAL: 2.9 g/dL (ref 1.5–4.5)
Glucose: 83 mg/dL (ref 65–99)
Potassium: 4.2 mmol/L (ref 3.5–5.2)
SODIUM: 142 mmol/L (ref 134–144)
Total Protein: 7 g/dL (ref 6.0–8.5)

## 2017-03-16 LAB — LIPID PANEL
CHOLESTEROL TOTAL: 111 mg/dL (ref 100–199)
Chol/HDL Ratio: 3 ratio (ref 0.0–5.0)
HDL: 37 mg/dL — ABNORMAL LOW (ref >39)
LDL CALC: 60 mg/dL (ref 0–99)
Triglycerides: 70 mg/dL (ref 0–149)
VLDL Cholesterol Cal: 14 mg/dL (ref 5–40)

## 2017-03-17 ENCOUNTER — Encounter: Payer: Self-pay | Admitting: Internal Medicine

## 2017-03-17 ENCOUNTER — Encounter: Payer: Self-pay | Admitting: Infectious Diseases

## 2017-03-17 DIAGNOSIS — I2510 Atherosclerotic heart disease of native coronary artery without angina pectoris: Secondary | ICD-10-CM | POA: Diagnosis not present

## 2017-03-17 DIAGNOSIS — J984 Other disorders of lung: Secondary | ICD-10-CM | POA: Diagnosis not present

## 2017-03-17 DIAGNOSIS — R0602 Shortness of breath: Secondary | ICD-10-CM | POA: Diagnosis not present

## 2017-03-17 DIAGNOSIS — I10 Essential (primary) hypertension: Secondary | ICD-10-CM

## 2017-03-17 DIAGNOSIS — Z951 Presence of aortocoronary bypass graft: Secondary | ICD-10-CM | POA: Diagnosis not present

## 2017-03-17 DIAGNOSIS — R079 Chest pain, unspecified: Secondary | ICD-10-CM

## 2017-03-17 DIAGNOSIS — I252 Old myocardial infarction: Secondary | ICD-10-CM | POA: Diagnosis not present

## 2017-03-17 DIAGNOSIS — E785 Hyperlipidemia, unspecified: Secondary | ICD-10-CM | POA: Diagnosis not present

## 2017-03-17 DIAGNOSIS — R072 Precordial pain: Secondary | ICD-10-CM | POA: Diagnosis not present

## 2017-03-20 ENCOUNTER — Other Ambulatory Visit: Payer: Self-pay

## 2017-03-20 DIAGNOSIS — Z6826 Body mass index (BMI) 26.0-26.9, adult: Secondary | ICD-10-CM | POA: Diagnosis not present

## 2017-03-20 DIAGNOSIS — Z79899 Other long term (current) drug therapy: Secondary | ICD-10-CM | POA: Diagnosis not present

## 2017-03-20 DIAGNOSIS — R0789 Other chest pain: Secondary | ICD-10-CM | POA: Diagnosis not present

## 2017-03-20 DIAGNOSIS — Z09 Encounter for follow-up examination after completed treatment for conditions other than malignant neoplasm: Secondary | ICD-10-CM | POA: Diagnosis not present

## 2017-03-20 NOTE — Patient Outreach (Signed)
[facility]) Care Management  03/20/2017  [name] [name] 1940/05/24 [id number]   [employer] referral: Client discharged from [facility] on 03/18/17.  TOC will be completed by primary care provider office who will refer to [facility] care management if needed.  Plan: RNCM will not open case.  Thea Silversmith, RN, MSN, [place] Coordinator Cell: [phone]

## 2017-03-22 ENCOUNTER — Inpatient Hospital Stay (HOSPITAL_COMMUNITY)
Admission: AD | Admit: 2017-03-22 | Discharge: 2017-03-26 | DRG: 286 | Disposition: A | Discharge status: Home / Self Care | Payer: PPO | Source: Other Acute Inpatient Hospital | Attending: Cardiology | Admitting: Cardiology

## 2017-03-22 ENCOUNTER — Other Ambulatory Visit: Payer: Self-pay

## 2017-03-22 ENCOUNTER — Encounter (HOSPITAL_COMMUNITY): Payer: Self-pay | Admitting: Physician Assistant

## 2017-03-22 DIAGNOSIS — M19042 Primary osteoarthritis, left hand: Secondary | ICD-10-CM | POA: Diagnosis not present

## 2017-03-22 DIAGNOSIS — Z888 Allergy status to other drugs, medicaments and biological substances status: Secondary | ICD-10-CM

## 2017-03-22 DIAGNOSIS — I2510 Atherosclerotic heart disease of native coronary artery without angina pectoris: Secondary | ICD-10-CM | POA: Diagnosis not present

## 2017-03-22 DIAGNOSIS — Z8546 Personal history of malignant neoplasm of prostate: Secondary | ICD-10-CM

## 2017-03-22 DIAGNOSIS — I10 Essential (primary) hypertension: Secondary | ICD-10-CM | POA: Diagnosis not present

## 2017-03-22 DIAGNOSIS — Z96652 Presence of left artificial knee joint: Secondary | ICD-10-CM | POA: Diagnosis not present

## 2017-03-22 DIAGNOSIS — I252 Old myocardial infarction: Secondary | ICD-10-CM | POA: Diagnosis not present

## 2017-03-22 DIAGNOSIS — Z7982 Long term (current) use of aspirin: Secondary | ICD-10-CM

## 2017-03-22 DIAGNOSIS — Z7902 Long term (current) use of antithrombotics/antiplatelets: Secondary | ICD-10-CM | POA: Diagnosis not present

## 2017-03-22 DIAGNOSIS — Z905 Acquired absence of kidney: Secondary | ICD-10-CM | POA: Diagnosis not present

## 2017-03-22 DIAGNOSIS — I4510 Unspecified right bundle-branch block: Secondary | ICD-10-CM | POA: Diagnosis not present

## 2017-03-22 DIAGNOSIS — Z96641 Presence of right artificial hip joint: Secondary | ICD-10-CM | POA: Diagnosis not present

## 2017-03-22 DIAGNOSIS — R079 Chest pain, unspecified: Secondary | ICD-10-CM | POA: Diagnosis not present

## 2017-03-22 DIAGNOSIS — I214 Non-ST elevation (NSTEMI) myocardial infarction: Secondary | ICD-10-CM | POA: Diagnosis present

## 2017-03-22 DIAGNOSIS — I079 Rheumatic tricuspid valve disease, unspecified: Secondary | ICD-10-CM | POA: Diagnosis not present

## 2017-03-22 DIAGNOSIS — R197 Diarrhea, unspecified: Secondary | ICD-10-CM | POA: Diagnosis present

## 2017-03-22 DIAGNOSIS — R7989 Other specified abnormal findings of blood chemistry: Secondary | ICD-10-CM | POA: Diagnosis present

## 2017-03-22 DIAGNOSIS — I25110 Atherosclerotic heart disease of native coronary artery with unstable angina pectoris: Secondary | ICD-10-CM | POA: Diagnosis not present

## 2017-03-22 DIAGNOSIS — Z955 Presence of coronary angioplasty implant and graft: Secondary | ICD-10-CM | POA: Diagnosis not present

## 2017-03-22 DIAGNOSIS — I493 Ventricular premature depolarization: Secondary | ICD-10-CM | POA: Diagnosis not present

## 2017-03-22 DIAGNOSIS — Q282 Arteriovenous malformation of cerebral vessels: Secondary | ICD-10-CM | POA: Diagnosis not present

## 2017-03-22 DIAGNOSIS — I200 Unstable angina: Secondary | ICD-10-CM

## 2017-03-22 DIAGNOSIS — I519 Heart disease, unspecified: Secondary | ICD-10-CM | POA: Diagnosis present

## 2017-03-22 DIAGNOSIS — E7800 Pure hypercholesterolemia, unspecified: Secondary | ICD-10-CM | POA: Diagnosis not present

## 2017-03-22 DIAGNOSIS — M79602 Pain in left arm: Secondary | ICD-10-CM | POA: Diagnosis not present

## 2017-03-22 DIAGNOSIS — R072 Precordial pain: Secondary | ICD-10-CM | POA: Diagnosis not present

## 2017-03-22 DIAGNOSIS — R402413 Glasgow coma scale score 13-15, at hospital admission: Secondary | ICD-10-CM | POA: Diagnosis present

## 2017-03-22 DIAGNOSIS — E785 Hyperlipidemia, unspecified: Secondary | ICD-10-CM | POA: Diagnosis present

## 2017-03-22 DIAGNOSIS — R51 Headache: Secondary | ICD-10-CM | POA: Diagnosis not present

## 2017-03-22 DIAGNOSIS — I249 Acute ischemic heart disease, unspecified: Secondary | ICD-10-CM | POA: Diagnosis not present

## 2017-03-22 DIAGNOSIS — Z79899 Other long term (current) drug therapy: Secondary | ICD-10-CM | POA: Diagnosis not present

## 2017-03-22 DIAGNOSIS — K219 Gastro-esophageal reflux disease without esophagitis: Secondary | ICD-10-CM | POA: Diagnosis not present

## 2017-03-22 DIAGNOSIS — Z87891 Personal history of nicotine dependence: Secondary | ICD-10-CM

## 2017-03-22 DIAGNOSIS — Z923 Personal history of irradiation: Secondary | ICD-10-CM | POA: Diagnosis not present

## 2017-03-22 HISTORY — DX: Ventricular premature depolarization: I49.3

## 2017-03-22 HISTORY — DX: Unstable angina: I20.0

## 2017-03-22 LAB — PLATELET INHIBITION P2Y12: PLATELET FUNCTION P2Y12: 183 [PRU] — AB (ref 194–418)

## 2017-03-22 LAB — MAGNESIUM: Magnesium: 1.8 mg/dL (ref 1.7–2.4)

## 2017-03-22 LAB — TROPONIN I: Troponin I: 0.19 ng/mL (ref <0.03)

## 2017-03-22 MED ORDER — POTASSIUM CHLORIDE CRYS ER 20 MEQ PO TBCR
20.0000 meq | EXTENDED_RELEASE_TABLET | Freq: Every day | ORAL | Status: DC
  Administered 2017-03-22 – 2017-03-26 (×5): 20 meq via ORAL
  Filled 2017-03-22 (×5): qty 1

## 2017-03-22 MED ORDER — AMLODIPINE BESYLATE 5 MG PO TABS: 5.0000 mg | ORAL_TABLET | Freq: Every morning | ORAL | Status: DC

## 2017-03-22 MED ORDER — SODIUM CHLORIDE 0.9% FLUSH
3.0000 mL | Freq: Two times a day (BID) | INTRAVENOUS | Status: DC
  Administered 2017-03-22 – 2017-03-26 (×5): 3 mL via INTRAVENOUS

## 2017-03-22 MED ORDER — METOPROLOL TARTRATE 25 MG PO TABS
25.0000 mg | ORAL_TABLET | Freq: Two times a day (BID) | ORAL | Status: DC
  Administered 2017-03-22 – 2017-03-26 (×8): 25 mg via ORAL
  Filled 2017-03-22 (×8): qty 1

## 2017-03-22 MED ORDER — PANTOPRAZOLE SODIUM 40 MG PO TBEC
40.0000 mg | DELAYED_RELEASE_TABLET | Freq: Every day | ORAL | Status: DC
  Administered 2017-03-23 – 2017-03-26 (×4): 40 mg via ORAL
  Filled 2017-03-22 (×5): qty 1

## 2017-03-22 MED ORDER — ASPIRIN 300 MG RE SUPP: 300.0000 mg | RECTAL | Status: DC

## 2017-03-22 MED ORDER — ACETAMINOPHEN 325 MG PO TABS
650.0000 mg | ORAL_TABLET | ORAL | Status: DC | PRN
  Administered 2017-03-24: 650 mg via ORAL
  Filled 2017-03-22: qty 2

## 2017-03-22 MED ORDER — ASPIRIN EC 81 MG PO TBEC
81.0000 mg | DELAYED_RELEASE_TABLET | Freq: Every day | ORAL | Status: DC
  Administered 2017-03-23 – 2017-03-24 (×2): 81 mg via ORAL
  Filled 2017-03-22 (×2): qty 1

## 2017-03-22 MED ORDER — SODIUM CHLORIDE 0.9% FLUSH: 3.0000 mL | INTRAVENOUS | Status: DC | PRN

## 2017-03-22 MED ORDER — SODIUM CHLORIDE 0.9 % IV SOLN: 250.0000 mL | INTRAVENOUS | Status: DC | PRN

## 2017-03-22 MED ORDER — NITROGLYCERIN 0.4 MG SL SUBL: 0.4000 mg | SUBLINGUAL_TABLET | SUBLINGUAL | Status: DC | PRN

## 2017-03-22 MED ORDER — ATORVASTATIN CALCIUM 80 MG PO TABS
80.0000 mg | ORAL_TABLET | Freq: Every day | ORAL | Status: DC
  Administered 2017-03-22 – 2017-03-25 (×4): 80 mg via ORAL
  Filled 2017-03-22 (×4): qty 1

## 2017-03-22 MED ORDER — HEPARIN (PORCINE) IN NACL 100-0.45 UNIT/ML-% IJ SOLN
1150.0000 [IU]/h | INTRAMUSCULAR | Status: DC
  Administered 2017-03-22 – 2017-03-24 (×3): 1150 [IU]/h via INTRAVENOUS
  Filled 2017-03-22 (×3): qty 250

## 2017-03-22 MED ORDER — ASPIRIN 81 MG PO CHEW: 324.0000 mg | CHEWABLE_TABLET | ORAL | Status: DC

## 2017-03-22 MED ORDER — AMLODIPINE BESYLATE 5 MG PO TABS
5.0000 mg | ORAL_TABLET | Freq: Every day | ORAL | Status: DC
  Administered 2017-03-22 – 2017-03-26 (×5): 5 mg via ORAL
  Filled 2017-03-22 (×5): qty 1

## 2017-03-22 MED ORDER — ONDANSETRON HCL 4 MG/2ML IJ SOLN: 4.0000 mg | Freq: Four times a day (QID) | INTRAMUSCULAR | Status: DC | PRN

## 2017-03-22 MED ORDER — POLYVINYL ALCOHOL 1.4 % OP SOLN
1.0000 [drp] | Freq: Three times a day (TID) | OPHTHALMIC | Status: DC | PRN
  Filled 2017-03-22: qty 15

## 2017-03-22 MED ORDER — CLOPIDOGREL BISULFATE 75 MG PO TABS
75.0000 mg | ORAL_TABLET | Freq: Every day | ORAL | Status: DC
  Administered 2017-03-23 – 2017-03-26 (×4): 75 mg via ORAL
  Filled 2017-03-22 (×5): qty 1

## 2017-03-22 NOTE — H&P (Signed)
Cardiology History & Physical    Patient ID: [name] [name] MRN: [id number], DOB: 1941-01-02 Date of Encounter: 03/22/2017, 7:15 PM Primary Physician: Helen Hashimoto., MD Primary Cardiologist: Dr. Bettina Gavia  Chief Complaint: chest pain Reason for Admission: elevated troponin Requesting MD: Dr. Christin Fudge  HPI: [name] [name] is a 77 y.o. male with history of CAD (s/p stent to Cx 2009, recent NSTEMI s/p DES to mLCx and m/dRCA), HTN, HLD (previous refusal of statins until recently), prostate CA, PVCs, remote tobacco abuse who presented back to the hospital with recurrent CP. He was recently admitted with chest pain and troponin peak of 1.44.  He underwent cath 1/10 with complicated findings; Dr. Tamala Julian preferred to discuss with colleagues and this ultimately resulted in re-cath 2/12 with DES to mid LCx and DES to mid distal RCA. 2D echo showed EF 50-55%, grade 1 DD, mod RAE, mild-mod TR, PASP 84mHg. PVCs referenced on tele at that time. He saw Dr. MBettina Gaviaback in f/u and was doing well. However, on Monday 2/25 he was admitted overnight for chest pain rule out and was discharged home. Last night around 2:30am he developed diarrhea x1. When he got back to bed he began having chest pain and left arm pain. He took 1 SL NTG and 2 baby aspirin with relief but pain persisted so he went to the ER around 4am. Pain finally eased off after 4-5 hours. W/u in the ER showed rising troponin with initial value 0.07 -> 0.24 (<0.04 negative, 0.04-0.4 is gray zone, >0.40 positive). Otherwise labs showed WBC 8.0, Hgb 15.1, Hct 42.5, Plt 191, PT 10.7, INR 1.1, Na 137, K 3.7, Cl 104, Cr 0.90, glucose 115, calcium 9, Tbili 0.8, AST 41, ALT 33, alk phos 77. CXR showed no active disease. It appears he received 524mhr normal saline, 7561mf Plavix and 31m70m enoxaparin. I do not see where he got any aspirin or other home meds. He was transferred to [facility] further evaluation and remains chest pain free and comfortable. Otherwise no  recent symptoms. No further diarrhea.  He reports taking daily ASA/Plavix without interruption but has been taking Prilosec which was not on his med lists here.  Past Medical History:  Diagnosis Date  . Arthritis    "left pinky" (03/04/2017)  . Cerebral vascular malformation    PT'S NEUROLOGIST IS DR. 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 Coronary artery disease    HX OF MI, RIGHT BUNDLE BRANCH BLOCK  . Essential hypertension 03/06/2017  . Headache(784.0)    POSSIBLE TRIGEMINAL NEURALGIA   . Hyperlipidemia   . Hypertension   . Mass of right kidney    FOUND ON WORK UP FOR ABD PAIN  . Myocardial infarction (HCC)[place]23-09  . NSTEMI (non-ST elevated myocardial infarction) (HCC)[place]/11/2017   /notArchie Endo1/2019  . Prostate cancer (HCC)[place]. Umbilical hernia      Surgical History:  Past Surgical History:  Procedure Laterality Date  . CARDIAC CATHETERIZATION  03/04/2017  . CHOLECYSTECTOMY N/A 09/19/2012   Procedure: ROBOTIC ASSISTED LAPAROSCOPIC CHOLECYSTECTOMY WITH UMBILICAL HERNIA REPAIR;  Surgeon: AlicLeighton Ruff;  [facility];  Service: General;  Laterality: N/A;  . CORONARY ANGIOPLASTY WITH STENT PLACEMENT  04/14/07  . CORONARY STENT INTERVENTION N/A 03/05/2017   Procedure: CORONARY STENT INTERVENTION;  Surgeon: JordMartiniqueter M, MD;  [facility];  Service: Cardiovascular;  Laterality: N/A;  .  JOINT REPLACEMENT    . LEFT HEART CATH AND CORONARY ANGIOGRAPHY N/A 03/04/2017   Procedure: LEFT HEART CATH AND CORONARY ANGIOGRAPHY;  Surgeon: Belva Crome, MD;  Location: [place] CV LAB;  Service: Cardiovascular;  Laterality: N/A;  . RADIOACTIVE SEED IMPLANT FOR PROSTATE CANCER  2 22 2006  . REPLACEMENT TOTAL KNEE Left 05/30/2010  . ROBOTIC ASSITED PARTIAL NEPHRECTOMY Right 09/19/2012   Procedure: ROBOTIC ASSITED PARTIAL NEPHRECTOMY;  Surgeon: Molli Hazard, MD;  [facility];   Service: Urology;  Laterality: Right;  . SHOULDER OPEN ROTATOR CUFF REPAIR Left 03/30/2008  . TOTAL HIP ARTHROPLASTY Right 01/2001  . TOTAL HIP REVISION  07/2008   acetabular revision  . ULTRASOUND GUIDANCE FOR VASCULAR ACCESS  03/05/2017   Procedure: Ultrasound Guidance For Vascular Access;  Surgeon: Martinique, Peter M, MD;  Location: [place] [facility] LAB;  Service: Cardiovascular;;     Home Meds: Prior to Admission medications   Medication Sig Start Date End Date Taking? Authorizing Provider  amLODipine (NORVASC) 5 MG tablet Take 5 mg by mouth every morning.  07/08/12   [provider]  aspirin 81 MG tablet Take 81 mg by mouth daily as needed for pain.     [provider]  atorvastatin (LIPITOR) 80 MG tablet Take 1 tablet (80 mg total) by mouth daily at 6 PM. 03/15/17   Munley, Hilton Cork, MD  Carboxymethylcellul-Glycerin (LUBRICATING EYE DROPS OP) Place 1 drop into both eyes daily as needed (dry eyes).    [provider]  clopidogrel (PLAVIX) 75 MG tablet Take 75 mg by mouth daily.    [provider]  DM-Doxylamine-Acetaminophen (NYQUIL COLD & FLU PO) Take 15 mLs by mouth at bedtime as needed (cough/cold).    [provider]  metoprolol tartrate (LOPRESSOR) 25 MG tablet Take 1 tablet (25 mg total) by mouth 2 (two) times daily. 03/15/17   Richardo Priest, MD  nitroGLYCERIN (NITROSTAT) 0.4 MG SL tablet Place 1 tablet (0.4 mg total) under the tongue every 5 (five) minutes as needed for chest pain. 03/15/17   Richardo Priest, MD    Allergies:  Allergies  Allergen Reactions  . Ativan [Lorazepam] Other (See Comments)    "Sees things."    Social History   Socioeconomic History  . Marital status: Married    Spouse name: Not on file  . Number of children: Not on file  . Years of education: Not on file  . Highest education level: Not on file  Social Needs  . Financial resource strain: Not on file  . Food insecurity - worry: Not on file  . Food  insecurity - inability: Not on file  . Transportation needs - medical: Not on file  . Transportation needs - non-medical: Not on file  Occupational History  . Not on file  Tobacco Use  . Smoking status: Former Smoker    Packs/day: 1.00    Years: 30.00    Pack years: 30.00    Types: Cigarettes  . Smokeless tobacco: Never Used  . Tobacco comment: "stopped ~ 1980"  Substance and Sexual Activity  . Alcohol use: No  . Drug use: No  . Sexual activity: Not on file  Other Topics Concern  . Not on file  Social History Narrative  . Not on file     Family History  Problem Relation Age of Onset  . Heart attack Mother     Review of Systems: No syncope, edema, orthopnea. All other systems reviewed and  are otherwise negative except as noted above.  Labs:   See above  Radiology/Studies:  See above  Recent Dg Chest 2 View  Result Date: 03/04/2017 CLINICAL DATA:  Chest pain EXAM: CHEST  2 VIEW COMPARISON:  01/03/2016 FINDINGS: The lungs appear clear.  Cardiac and mediastinal contours normal. No pleural effusion identified. Lower thoracic spondylosis. IMPRESSION: 1.  No active cardiopulmonary disease is radiographically apparent. 2. Lower thoracic spondylosis. Electronically Signed   By: Van Clines M.D.   On: 03/04/2017 08:35   Wt Readings from Last 3 Encounters:  03/15/17 213 lb 6.4 oz (96.8 kg)  03/06/17 212 lb 11.9 oz (96.5 kg)  10/22/12 196 lb 9.6 oz (89.2 kg)    EKG: NSR 68bpm frequent PVCs, nonspecific ST-T changes  Physical Exam: Blood pressure (!) 147/98, pulse 82, temperature 98.1 F (36.7 C), temperature source Oral, SpO2 98 %. There is no height or weight on file to calculate BMI. General: Well developed, well nourished WM, in no acute distress. Head: Normocephalic, atraumatic, sclera non-icteric, no xanthomas, nares are without discharge.  Neck: Negative for carotid bruits. JVD not elevated. Lungs: Clear bilaterally to auscultation without wheezes, rales, or  rhonchi. Breathing is unlabored. Heart: RRR with S1 S2. No murmurs, rubs, or gallops appreciated. Abdomen: Soft, non-tender, non-distended with normoactive bowel sounds. No hepatomegaly. No rebound/guarding. No obvious abdominal masses. Msk:  Strength and tone appear normal for age. Extremities: No clubbing or cyanosis. No edema.  Distal pedal pulses are 2+ and equal bilaterally. Neuro: Alert and oriented X 3. No focal deficit. No facial asymmetry. Moves all extremities spontaneously. Psych:  Responds to questions appropriately with a normal affect.    Assessment and Plan   1. CAD / possible recurrent NSTEMI - the possibility of a downtrending troponin from prior admission was considered, but his troponins trended up at [facility] this AM. Will admit. Will order heparin per pharmacy instead of continuing Lovenox. Patient took 2 ASA already this AM and got Plavix at OSH. Does not appear he got amlodipine or metoprolol so will re-order along with statin. Advised him in the future not to take Prilosec or Nexium with Plavix. Check P2Y12. Will need interventional team to review cath report in AM given residual disease to decide if relook cath is warranted vs escalation of medical therapy. Check troponin tonight and again in AM.  2. Frequent PVCs - noted last adm as well. K 3.7 @ OSH. Will start KCl 30mq daily. Added Mg level as well with care order to call if 1.8 or less. May need titration of BB this admission.  3. HLD - continue statin - recently started. LDL on 2/22 was already down to 70.  4. HTN - BP running elevated but does not appear he got metoprolol or amlodipine today. This has been ordered and we will follow.   Severity of Illness: The appropriate patient status for this patient is INPATIENT. Inpatient status is judged to be reasonable and necessary in order to provide the required intensity of service to ensure the patient's safety. The patient's presenting symptoms, physical exam  findings, and initial radiographic and laboratory data in the context of their chronic comorbidities is felt to place them at high risk for further clinical deterioration. Furthermore, it is not anticipated that the patient will be medically stable for discharge from the hospital within 2 midnights of admission. The following factors support the patient status of inpatient.   " The patient's presenting symptoms include chest pain. " The worrisome physical exam  findings include occasional ectopy. " The initial radiographic and laboratory data are worrisome because of elevated troponin. " The chronic co-morbidities include CAD, HTN, HLD.   * I certify that at the point of admission it is my clinical judgment that the patient will require inpatient hospital care spanning beyond 2 midnights from the point of admission due to high intensity of service, high risk for further deterioration and high frequency of surveillance required.*    For questions or updates, please contact [place] Please consult [email or web address] for contact info under Cardiology/STEMI.  Signed, Charlie Pitter, PA-C 03/22/2017, 7:15 PM

## 2017-03-22 NOTE — Progress Notes (Signed)
CRITICAL VALUE ALERT  Critical Value:  0.19 Troponin  Date & Time Notied:  03/22/17; 2320  Provider Notified:  Cardiology text  Orders Received/Actions taken: pending

## 2017-03-22 NOTE — Progress Notes (Signed)
ANTICOAGULATION CONSULT NOTE - Initial Consult  Pharmacy Consult for heparin Indication: chest pain/ACS  Allergies  Allergen Reactions  . Ativan [Lorazepam] Other (See Comments)    "Sees things."    Patient Measurements:     Vital Signs: Temp: 98.1 F (36.7 C) (03/01 1803) Temp Source: Oral (03/01 1803) BP: 147/98 (03/01 1803) Pulse Rate: 82 (03/01 1803)  Labs: No results for input(s): HGB, HCT, PLT, APTT, LABPROT, INR, HEPARINUNFRC, HEPRLOWMOCWT, CREATININE, CKTOTAL, CKMB, TROPONINI in the last 72 hours.  Estimated Creatinine Clearance: 71.3 mL/min (by C-G formula based on SCr of 1.07 mg/dL).  Assessment: CC/HPI: 77 yo m presenting from [place] with CP/elevated trop   PMH: CAD, HTN, HLD, prostate ca  Anticoag: none pta - 1 dose of 95 mg enox 3/1 ~ 0900  Goal of Therapy:  Heparin level 0.3-0.7 units/ml Monitor platelets by anticoagulation protocol: Yes   Plan:  No bolus d/t enox use Heparin 1150 units/hr Daily HL CBC F/u cards plans - repeat cath vs change in med therapy  Levester Fresh, PharmD, BCPS, BCCCP Clinical Pharmacist Clinical phone for 03/22/2017 from 1430 - 2300: [phone] If after 2300, please call main pharmacy at: [phone] 03/22/2017 7:40 PM

## 2017-03-23 ENCOUNTER — Encounter (HOSPITAL_COMMUNITY): Payer: Self-pay

## 2017-03-23 LAB — BASIC METABOLIC PANEL
ANION GAP: 9 (ref 5–15)
BUN: 15 mg/dL (ref 6–20)
CALCIUM: 8.8 mg/dL — AB (ref 8.9–10.3)
CO2: 23 mmol/L (ref 22–32)
Chloride: 106 mmol/L (ref 101–111)
Creatinine, Ser: 0.95 mg/dL (ref 0.61–1.24)
Glucose, Bld: 90 mg/dL (ref 65–99)
Potassium: 3.8 mmol/L (ref 3.5–5.1)
Sodium: 138 mmol/L (ref 135–145)

## 2017-03-23 LAB — CBC
HCT: 40.6 % (ref 39.0–52.0)
Hemoglobin: 13.9 g/dL (ref 13.0–17.0)
MCH: 33.5 pg (ref 26.0–34.0)
MCHC: 34.2 g/dL (ref 30.0–36.0)
MCV: 97.8 fL (ref 78.0–100.0)
PLATELETS: 156 10*3/uL (ref 150–400)
RBC: 4.15 MIL/uL — ABNORMAL LOW (ref 4.22–5.81)
RDW: 12.2 % (ref 11.5–15.5)
WBC: 6.9 10*3/uL (ref 4.0–10.5)

## 2017-03-23 LAB — TROPONIN I: Troponin I: 0.18 ng/mL (ref <0.03)

## 2017-03-23 LAB — HEPARIN LEVEL (UNFRACTIONATED)
HEPARIN UNFRACTIONATED: 0.38 [IU]/mL (ref 0.30–0.70)
Heparin Unfractionated: 0.2 IU/mL — ABNORMAL LOW (ref 0.30–0.70)
Heparin Unfractionated: 0.4 IU/mL (ref 0.30–0.70)

## 2017-03-23 NOTE — Progress Notes (Signed)
[facility] for Heparin Indication: chest pain/ACS  Allergies  Allergen Reactions  . Ativan [Lorazepam] Other (See Comments)    "Sees things."    Vital Signs: Temp: 98.1 F (36.7 C) (03/01 2024) Temp Source: Oral (03/01 2024) BP: 134/82 (03/01 2024) Pulse Rate: 77 (03/01 2232)  Labs: Recent Labs    03/22/17 2114 03/23/17 0246  HGB  --  13.9  HCT  --  40.6  PLT  --  156  HEPARINUNFRC  --  0.38  TROPONINI 0.19*  --     Estimated Creatinine Clearance: 71.3 mL/min (by C-G formula based on SCr of 1.07 mg/dL).  Assessment: CC/HPI: 77 yo m presenting from [facility] with CP/elevated trop   PMH: CAD, HTN, HLD, prostate ca  Anticoag: none pta - 1 dose of 95 mg enox 3/1 ~ 0900  Update 3/2 AM: initial heparin level is therapeutic, Hgb good  Goal of Therapy:  Heparin level 0.3-0.7 units/ml Monitor platelets by anticoagulation protocol: Yes   Plan:  Cont heparin at 1150 units/hr 1200 HL  Narda Bonds, PharmD, BCPS Clinical Pharmacist Phone: [phone]

## 2017-03-23 NOTE — Progress Notes (Signed)
ANTICOAGULATION CONSULT NOTE - Follow Up Consult  Pharmacy Consult for heparin Indication: chest pain/ACS  Allergies  Allergen Reactions  . Ativan [Lorazepam] Other (See Comments)    "Sees things."    Patient Measurements: Weight: 216 lb 7.9 oz (98.2 kg)   Vital Signs: Temp: 98 F (36.7 C) (03/02 1432) BP: 113/83 (03/02 1432) Pulse Rate: 68 (03/02 1432)  Labs: Recent Labs    03/22/17 2114 03/23/17 0246 03/23/17 1129 03/23/17 1833  HGB  --  13.9  --   --   HCT  --  40.6  --   --   PLT  --  156  --   --   HEPARINUNFRC  --  0.38 0.20* 0.40  CREATININE  --  0.95  --   --   TROPONINI 0.19* 0.18*  --   --     Estimated Creatinine Clearance: 80.3 mL/min (by C-G formula based on SCr of 0.95 mg/dL).   Medications:  Scheduled:  . amLODipine  5 mg Oral Daily  . aspirin EC  81 mg Oral Daily  . atorvastatin  80 mg Oral q1800  . clopidogrel  75 mg Oral Daily  . metoprolol tartrate  25 mg Oral BID  . pantoprazole  40 mg Oral Q0600  . potassium chloride  20 mEq Oral Daily  . sodium chloride flush  3 mL Intravenous Q12H   Infusions:  . sodium chloride    . heparin 1,150 Units/hr (03/23/17 1935)    Assessment: 35 YOM on heparin for ACS r/o, PMH significant for recent NSTEMI s/p cath/stent last month. Cardiology to decide on repeat cath vs medical management. Heparin level was low this morning, however per discussion with RN there was some bleeding/oozing from IV site around ~10am that resulted in removal of the IV for ~20 min. This interruption may explain the low level at 11:30am, as the patient was previously therapeutic on this rate. RN was able to restart heparin successfully without further issues. CBC is stable.  PM update: Heparin level is again therapeutic on 1150 units/hr.  No change.  Goal of Therapy:  Heparin level 0.3-0.7 units/ml Monitor platelets by anticoagulation protocol: Yes   Plan:  Continue heparin 1150 units/hr Monitor daily heparin level, CBC,  s/sx of bleeding F/u cardiology cath plans   [facility], Pharm.D., BCPS Clinical Pharmacist 03/23/2017 7:54 PM

## 2017-03-23 NOTE — Progress Notes (Signed)
Subjective:  History reviewed and chart reviewed.  Seen at [facility] with prolonged chest pain with possible rising troponins.  Troponins here are mildly elevated and are flat.  Objective:  Vital Signs in the last 24 hours: BP 115/89   Pulse 74   Temp 98.1 F (36.7 C) (Oral)   Resp 20   Wt 98.2 kg (216 lb 7.9 oz)   SpO2 95%   BMI 27.06 kg/m   Physical Exam: Pleasant male in no acute distress Lungs:  Clear Cardiac:  Regular rhythm, normal S1 and S2, no S3 Abdomen:  Soft, nontender, no masses Extremities:  No edema present  Intake/Output from previous day: 03/01 0701 - 03/02 0700 In: 466.5 [P.O.:360; I.V.:106.5] Out: 565 [Urine:565]  Weight Filed Weights   03/23/17 0500  Weight: 98.2 kg (216 lb 7.9 oz)    Lab Results: Basic Metabolic Panel: Recent Labs    03/23/17 0246  NA 138  K 3.8  CL 106  CO2 23  GLUCOSE 90  BUN 15  CREATININE 0.95   CBC: Recent Labs    03/23/17 0246  WBC 6.9  HGB 13.9  HCT 40.6  MCV 97.8  PLT 156   Cardiac Panel (last 3 results) Recent Labs    03/22/17 2114 03/23/17 0246  TROPONINI 0.19* 0.18*    Telemetry: Sinus with occasional PVCs personally reviewed  Assessment/Plan:  1.  Prolonged chest pain 10 days post stenting of RCA and circumflex with mildly elevated troponins possible non-STEMI versus residual troponin elevation 2.  CAD  Recommendations:  Currently pain-free and on heparin.  Discussed with Dr. Ellyn Hack and will review the case but would plan on repeat catheterization to evaluate patency of stents and rule out edge dissection.  Discussed with patient.  Kerry Hough  MD [facility] Cardiology  03/23/2017, 10:22 AM

## 2017-03-23 NOTE — Progress Notes (Signed)
ANTICOAGULATION CONSULT NOTE - Follow Up Consult  Pharmacy Consult for heparin Indication: chest pain/ACS  Allergies  Allergen Reactions  . Ativan [Lorazepam] Other (See Comments)    "Sees things."    Patient Measurements: Weight: 216 lb 7.9 oz (98.2 kg)   Vital Signs: Temp: 98.1 F (36.7 C) (03/02 0500) Temp Source: Oral (03/02 0500) BP: 115/89 (03/02 0819) Pulse Rate: 74 (03/02 0819)  Labs: Recent Labs    03/22/17 2114 03/23/17 0246 03/23/17 1129  HGB  --  13.9  --   HCT  --  40.6  --   PLT  --  156  --   HEPARINUNFRC  --  0.38 0.20*  CREATININE  --  0.95  --   TROPONINI 0.19* 0.18*  --     Estimated Creatinine Clearance: 80.3 mL/min (by C-G formula based on SCr of 0.95 mg/dL).   Medications:  Scheduled:  . amLODipine  5 mg Oral Daily  . aspirin EC  81 mg Oral Daily  . atorvastatin  80 mg Oral q1800  . clopidogrel  75 mg Oral Daily  . metoprolol tartrate  25 mg Oral BID  . pantoprazole  40 mg Oral Q0600  . potassium chloride  20 mEq Oral Daily  . sodium chloride flush  3 mL Intravenous Q12H   Infusions:  . sodium chloride    . heparin 1,150 Units/hr (03/22/17 2000)    Assessment: 27 YOM on heparin for ACS r/o, PMH significant for recent NSTEMI s/p cath/stent last month. Cardiology to decide on repeat cath vs medical management. Heparin level was low this morning, however per discussion with RN there was some bleeding/oozing from IV site around ~10am that resulted in removal of the IV for ~20 min. This interruption may explain the low level at 11:30am, as the patient was previously therapeutic on this rate. RN was able to restart heparin successfully without further issues. CBC is stable.  Goal of Therapy:  Heparin level 0.3-0.7 units/ml Monitor platelets by anticoagulation protocol: Yes   Plan:  Continue heparin 1150 units/hr Re-check 8-hour heparin level Monitor daily heparin level, CBC, s/sx of bleeding F/u cardiology plans - cath  likely   Charlene Brooke, PharmD PGY1 Pharmacy Resident Phone: [phone] After 3:30PM please call [place] [phone] 03/23/2017,1:37 PM

## 2017-03-24 LAB — CBC
HEMATOCRIT: 40.7 % (ref 39.0–52.0)
HEMOGLOBIN: 14 g/dL (ref 13.0–17.0)
MCH: 34.1 pg — ABNORMAL HIGH (ref 26.0–34.0)
MCHC: 34.4 g/dL (ref 30.0–36.0)
MCV: 99 fL (ref 78.0–100.0)
Platelets: 158 10*3/uL (ref 150–400)
RBC: 4.11 MIL/uL — AB (ref 4.22–5.81)
RDW: 12.4 % (ref 11.5–15.5)
WBC: 6.9 10*3/uL (ref 4.0–10.5)

## 2017-03-24 LAB — HEPARIN LEVEL (UNFRACTIONATED): HEPARIN UNFRACTIONATED: 0.47 [IU]/mL (ref 0.30–0.70)

## 2017-03-24 MED ORDER — SODIUM CHLORIDE 0.9 % WEIGHT BASED INFUSION: 1.0000 mL/kg/h | INTRAVENOUS | Status: DC

## 2017-03-24 MED ORDER — SODIUM CHLORIDE 0.9% FLUSH: 3.0000 mL | INTRAVENOUS | Status: DC | PRN

## 2017-03-24 MED ORDER — SODIUM CHLORIDE 0.9 % WEIGHT BASED INFUSION
3.0000 mL/kg/h | INTRAVENOUS | Status: DC
  Administered 2017-03-25: 3 mL/kg/h via INTRAVENOUS

## 2017-03-24 MED ORDER — PHENOL 1.4 % MT LIQD
1.0000 | OROMUCOSAL | Status: DC | PRN
  Administered 2017-03-24: 1 via OROMUCOSAL
  Filled 2017-03-24: qty 177

## 2017-03-24 MED ORDER — SODIUM CHLORIDE 0.9 % IV SOLN: 250.0000 mL | INTRAVENOUS | Status: DC | PRN

## 2017-03-24 MED ORDER — SODIUM CHLORIDE 0.9% FLUSH
3.0000 mL | Freq: Two times a day (BID) | INTRAVENOUS | Status: DC
  Administered 2017-03-25: 3 mL via INTRAVENOUS

## 2017-03-24 MED ORDER — ASPIRIN 81 MG PO CHEW
81.0000 mg | CHEWABLE_TABLET | ORAL | Status: AC
  Administered 2017-03-25: 81 mg via ORAL
  Filled 2017-03-24: qty 1

## 2017-03-24 MED ORDER — HYDROCORTISONE 1 % EX CREA
TOPICAL_CREAM | Freq: Four times a day (QID) | CUTANEOUS | Status: DC
  Administered 2017-03-24: 22:00:00 via TOPICAL
  Administered 2017-03-25: 1 via TOPICAL
  Filled 2017-03-24: qty 28

## 2017-03-24 MED ORDER — ASPIRIN EC 81 MG PO TBEC
81.0000 mg | DELAYED_RELEASE_TABLET | Freq: Every day | ORAL | Status: DC
  Administered 2017-03-26: 81 mg via ORAL
  Filled 2017-03-24: qty 1

## 2017-03-24 NOTE — Progress Notes (Signed)
Subjective:  Feels well today without recurrent chest pain.troponins as below are low level and flat unclear whether this represents residual elevation or new ischemia.  Objective:  Vital Signs in the last 24 hours: BP 103/71 (BP Location: Left Arm)   Pulse 76   Temp 98.5 F (36.9 C) (Oral)   Resp 14   Ht 6\' 3"  (1.905 m)   Wt 92.2 kg (203 lb 3.2 oz)   SpO2 94%   BMI 25.40 kg/m   Physical Exam: Pleasant male in no acute distress Lungs:  Clear Cardiac:  Regular rhythm, normal S1 and S2, no S3 Extremities:  No edema present  Intake/Output from previous day: 03/02 0701 - 03/03 0700 In: 480 [P.O.:480] Out: 400 [Urine:400]  Weight Filed Weights   03/23/17 0500 03/24/17 0425  Weight: 98.2 kg (216 lb 7.9 oz) 92.2 kg (203 lb 3.2 oz)    Lab Results: Basic Metabolic Panel: Recent Labs    03/23/17 0246  NA 138  K 3.8  CL 106  CO2 23  GLUCOSE 90  BUN 15  CREATININE 0.95   CBC: Recent Labs    03/23/17 0246  WBC 6.9  HGB 13.9  HCT 40.6  MCV 97.8  PLT 156   Cardiac Panel (last 3 results) Recent Labs    03/22/17 2114 03/23/17 0246  TROPONINI 0.19* 0.18*    Telemetry: Sinus with occasional PVCs personally reviewed  Assessment/Plan:  1.  Prolonged chest pain 10 days post stenting of RCA and circumflex with mildly elevated troponins possible non-STEMI versus residual troponin elevation 2.  CAD  Recommendations:  Plan catheterization in the morning. Cardiac catheterization was discussed with the patient fully including risks of myocardial infarction, death, stroke, bleeding, arrhythmia, dye allergy, renal insufficiency or bleeding.  The patient understands and is willing to proceed.  Possibility of intervention at the same time also discussed with patient and he understands and is agreeable to proceed   W. Doristine Church  MD [facility] Cardiology  03/24/2017, 9:10 AM

## 2017-03-24 NOTE — Progress Notes (Signed)
ANTICOAGULATION CONSULT NOTE - Follow Up Consult  Pharmacy Consult for heparin Indication: chest pain/ACS  Allergies  Allergen Reactions  . Ativan [Lorazepam] Other (See Comments)    "Sees things."    Patient Measurements: Height: 6\' 3"  (190.5 cm) Weight: 203 lb 3.2 oz (92.2 kg) IBW/kg (Calculated) : 84.5 Heparin Dosing Weight: 96.8 kg  Vital Signs: Temp: 98.5 F (36.9 C) (03/03 0425) Temp Source: Oral (03/03 0425) BP: 102/78 (03/03 0948) Pulse Rate: 72 (03/03 0948)  Labs: Recent Labs    03/22/17 2114  03/23/17 0246 03/23/17 1129 03/23/17 1833 03/24/17 0858  HGB  --   --  13.9  --   --  14.0  HCT  --   --  40.6  --   --  40.7  PLT  --   --  156  --   --  158  HEPARINUNFRC  --    < > 0.38 0.20* 0.40 0.47  CREATININE  --   --  0.95  --   --   --   TROPONINI 0.19*  --  0.18*  --   --   --    < > = values in this interval not displayed.    Estimated Creatinine Clearance: 80.3 mL/min (by C-G formula based on SCr of 0.95 mg/dL).   Medications:  Scheduled:  . amLODipine  5 mg Oral Daily  . aspirin EC  81 mg Oral Daily  . atorvastatin  80 mg Oral q1800  . clopidogrel  75 mg Oral Daily  . metoprolol tartrate  25 mg Oral BID  . pantoprazole  40 mg Oral Q0600  . potassium chloride  20 mEq Oral Daily  . sodium chloride flush  3 mL Intravenous Q12H   Infusions:  . sodium chloride    . heparin 1,150 Units/hr (03/23/17 1935)    Assessment: 47 YOM on heparin for ACS r/o, PMH significant for recent NSTEMI in Feb s/p cath/DES x 2. Heparin level is therapeutic today 0.47. CBC is wnl, no bleeding noted.  Goal of Therapy:  Heparin level 0.3-0.7 units/ml Monitor platelets by anticoagulation protocol: Yes   Plan:  Continue heparin 1150 units/hr Monitor daily heparin level and CBC Cardiac cath Monday - follow up cardiology plans post-cath   Charlene Brooke, PharmD PGY1 Pharmacy Resident Phone: [phone] After 3:30PM please call [place] [phone] 03/24/2017,9:49  AM

## 2017-03-25 ENCOUNTER — Ambulatory Visit: Payer: PPO | Admitting: Cardiology

## 2017-03-25 ENCOUNTER — Encounter (HOSPITAL_COMMUNITY)
Admission: AD | Disposition: A | Discharge status: Home / Self Care | Payer: Self-pay | Source: Other Acute Inpatient Hospital | Attending: Cardiology

## 2017-03-25 DIAGNOSIS — I200 Unstable angina: Secondary | ICD-10-CM

## 2017-03-25 HISTORY — PX: LEFT HEART CATH AND CORONARY ANGIOGRAPHY: CATH118249

## 2017-03-25 LAB — PROTIME-INR
INR: 1.18
Prothrombin Time: 14.9 seconds (ref 11.4–15.2)

## 2017-03-25 LAB — CBC
HCT: 39.6 % (ref 39.0–52.0)
Hemoglobin: 13.3 g/dL (ref 13.0–17.0)
MCH: 33.4 pg (ref 26.0–34.0)
MCHC: 33.6 g/dL (ref 30.0–36.0)
MCV: 99.5 fL (ref 78.0–100.0)
PLATELETS: 147 10*3/uL — AB (ref 150–400)
RBC: 3.98 MIL/uL — ABNORMAL LOW (ref 4.22–5.81)
RDW: 12.4 % (ref 11.5–15.5)
WBC: 5.9 10*3/uL (ref 4.0–10.5)

## 2017-03-25 LAB — POCT ACTIVATED CLOTTING TIME: Activated Clotting Time: 136 seconds

## 2017-03-25 LAB — HEPARIN LEVEL (UNFRACTIONATED): Heparin Unfractionated: 0.4 IU/mL (ref 0.30–0.70)

## 2017-03-25 SURGERY — LEFT HEART CATH AND CORONARY ANGIOGRAPHY
Anesthesia: LOCAL

## 2017-03-25 MED ORDER — SODIUM CHLORIDE 0.9 % IV SOLN: INTRAVENOUS | Status: AC

## 2017-03-25 MED ORDER — SODIUM CHLORIDE 0.9% FLUSH: 3.0000 mL | Freq: Two times a day (BID) | INTRAVENOUS | Status: DC

## 2017-03-25 MED ORDER — ONDANSETRON HCL 4 MG/2ML IJ SOLN: 4.0000 mg | Freq: Four times a day (QID) | INTRAMUSCULAR | Status: DC | PRN

## 2017-03-25 MED ORDER — ACETAMINOPHEN 325 MG PO TABS: 650.0000 mg | ORAL_TABLET | ORAL | Status: DC | PRN

## 2017-03-25 MED ORDER — IOPAMIDOL (ISOVUE-370) INJECTION 76%
INTRAVENOUS | Status: DC | PRN
  Administered 2017-03-25: 50 mL via INTRA_ARTERIAL

## 2017-03-25 MED ORDER — CLOPIDOGREL BISULFATE 75 MG PO TABS: 75.0000 mg | ORAL_TABLET | Freq: Every day | ORAL | Status: DC

## 2017-03-25 MED ORDER — SODIUM CHLORIDE 0.9 % IV SOLN: 250.0000 mL | INTRAVENOUS | Status: DC | PRN

## 2017-03-25 MED ORDER — HEPARIN (PORCINE) IN NACL 2-0.9 UNIT/ML-% IJ SOLN
INTRAMUSCULAR | Status: AC | PRN
  Administered 2017-03-25 (×2): 500 mL via INTRA_ARTERIAL

## 2017-03-25 MED ORDER — LIDOCAINE HCL (PF) 1 % IJ SOLN
INTRAMUSCULAR | Status: DC | PRN
  Administered 2017-03-25: 20 mL

## 2017-03-25 MED ORDER — ATORVASTATIN CALCIUM 80 MG PO TABS: 80.0000 mg | ORAL_TABLET | Freq: Every day | ORAL | Status: DC

## 2017-03-25 MED ORDER — LIDOCAINE HCL (PF) 1 % IJ SOLN
INTRAMUSCULAR | Status: AC
  Filled 2017-03-25: qty 30

## 2017-03-25 MED ORDER — SODIUM CHLORIDE 0.9% FLUSH
3.0000 mL | INTRAVENOUS | Status: DC | PRN
Start: 2017-03-25 — End: 2017-03-25

## 2017-03-25 MED ORDER — ASPIRIN 81 MG PO CHEW: 81.0000 mg | CHEWABLE_TABLET | Freq: Every day | ORAL | Status: DC

## 2017-03-25 MED ORDER — MORPHINE SULFATE (PF) 4 MG/ML IV SOLN: 2.0000 mg | INTRAVENOUS | Status: DC | PRN

## 2017-03-25 MED ORDER — IOPAMIDOL (ISOVUE-370) INJECTION 76%
INTRAVENOUS | Status: AC
  Filled 2017-03-25: qty 100

## 2017-03-25 MED ORDER — HEPARIN (PORCINE) IN NACL 2-0.9 UNIT/ML-% IJ SOLN
INTRAMUSCULAR | Status: AC
  Filled 2017-03-25: qty 1000

## 2017-03-25 SURGICAL SUPPLY — 7 items
CATH INFINITI 5FR MULTPACK ANG (CATHETERS) ×2 IMPLANT
KIT HEART LEFT (KITS) ×2 IMPLANT
PACK CARDIAC CATHETERIZATION (CUSTOM PROCEDURE TRAY) ×2 IMPLANT
SHEATH PINNACLE 5F 10CM (SHEATH) ×2 IMPLANT
SYR MEDRAD MARK V 150ML (SYRINGE) ×2 IMPLANT
TRANSDUCER W/STOPCOCK (MISCELLANEOUS) ×2 IMPLANT
WIRE EMERALD 3MM-J .035X150CM (WIRE) ×2 IMPLANT

## 2017-03-25 NOTE — H&P (View-Only) (Signed)
Progress Note  Patient Name: [name] [name] Date of Encounter: 03/25/2017  Primary Cardiologist: Shirlee More, MD  Subjective   No chest pain. Planned for cath today.   Inpatient Medications    Scheduled Meds: . amLODipine  5 mg Oral Daily  . [START ON 03/26/2017] aspirin EC  81 mg Oral Daily  . atorvastatin  80 mg Oral q1800  . clopidogrel  75 mg Oral Daily  . hydrocortisone cream   Topical QID  . metoprolol tartrate  25 mg Oral BID  . pantoprazole  40 mg Oral Q0600  . potassium chloride  20 mEq Oral Daily  . sodium chloride flush  3 mL Intravenous Q12H  . sodium chloride flush  3 mL Intravenous Q12H   Continuous Infusions: . sodium chloride    . sodium chloride    . sodium chloride 1 mL/kg/hr (03/25/17 0624)  . heparin 1,150 Units/hr (03/24/17 1924)   PRN Meds: sodium chloride, sodium chloride, acetaminophen, nitroGLYCERIN, ondansetron (ZOFRAN) IV, phenol, polyvinyl alcohol, sodium chloride flush, sodium chloride flush   Vital Signs    Vitals:   03/24/17 1400 03/24/17 1708 03/24/17 1918 03/25/17 0357  BP: 114/76 124/83 (!) 146/92 130/80  Pulse: 62 77 84 70  Resp: 20 18 17 16   Temp: 98.1 F (36.7 C) 98.4 F (36.9 C) 97.7 F (36.5 C) 97.6 F (36.4 C)  TempSrc: Oral Oral Oral Oral  SpO2: 96% 98% 98% 97%  Weight:    206 lb 9.6 oz (93.7 kg)  Height:        Intake/Output Summary (Last 24 hours) at 03/25/2017 0748 Last data filed at 03/25/2017 0700 Gross per 24 hour  Intake 1695.25 ml  Output 1425 ml  Net 270.25 ml   Filed Weights   03/23/17 0500 03/24/17 0425 03/25/17 0357  Weight: 216 lb 7.9 oz (98.2 kg) 203 lb 3.2 oz (92.2 kg) 206 lb 9.6 oz (93.7 kg)    Telemetry    SR with occ PVCs - Personally Reviewed  ECG    N/a - Personally Reviewed  Physical Exam   General: Well developed, well nourished, male appearing in no acute distress. Head: Normocephalic, atraumatic.  Neck: Supple without bruits, JVD. Lungs:  Resp regular and unlabored, CTA. Heart:  RRR, S1, S2, no S3, S4, or murmur; no rub. Abdomen: Soft, non-tender, non-distended with normoactive bowel sounds. No hepatomegaly. No rebound/guarding. No obvious abdominal masses. Extremities: No clubbing, cyanosis, edema. Distal pedal pulses are 2+ bilaterally. Neuro: Alert and oriented X 3. Moves all extremities spontaneously. Psych: Normal affect.  Labs    Chemistry Recent Labs  Lab 03/23/17 0246  NA 138  K 3.8  CL 106  CO2 23  GLUCOSE 90  BUN 15  CREATININE 0.95  CALCIUM 8.8*  GFRNONAA >60  GFRAA >60  ANIONGAP 9     Hematology Recent Labs  Lab 03/23/17 0246 03/24/17 0858 03/25/17 0350  WBC 6.9 6.9 5.9  RBC 4.15* 4.11* 3.98*  HGB 13.9 14.0 13.3  HCT 40.6 40.7 39.6  MCV 97.8 99.0 99.5  MCH 33.5 34.1* 33.4  MCHC 34.2 34.4 33.6  RDW 12.2 12.4 12.4  PLT 156 158 147*    Cardiac Enzymes Recent Labs  Lab 03/22/17 2114 03/23/17 0246  TROPONINI 0.19* 0.18*   No results for input(s): TROPIPOC in the last 168 hours.   BNPNo results for input(s): BNP, PROBNP in the last 168 hours.   DDimer No results for input(s): DDIMER in the last 168 hours.    Radiology  No results found.  Cardiac Studies   N/a   Patient Profile     77 y.o. male with history of CAD (s/p stent to Cx 2009, recent NSTEMI s/p DES to mLCx and m/dRCA), HTN, HLD (previous refusal of statins until recently), prostate CA, PVCs, remote tobacco abuse who presented back to the hospital with recurrent CP. Transferred from [place] to [place] for further work up.   Assessment & Plan    1. CAD/elevated troponin: Presented with prolonged episode of chest pain in the setting of recent stenting the RCA/LCx. No further chest pain since admission to [facility]. Planned for cath today. The patient understands that risks included but are not limited to stroke (1 in 1000), death (1 in 27), kidney failure [usually temporary] (1 in 500), bleeding (1 in 200), allergic reaction [possibly serious] (1 in 200).  --  remains on ASA, Statin, BB, plavix and heparin  2. PVCs: occ noted on telemetry. Appears to be asymptomatic. Electrolytes stable.  -- continue BB  3. HL: recently started on high dose statin during last admission. LDL 60 at office visit on 03/15/17. Continue the same.   4. HTN: stable with current therapy.   Signed, Reino Bellis, NP  03/25/2017, 7:48 AM  Pager # [phone]   For questions or updates, please contact [place] Please consult [email or web address] for contact info under Cardiology/STEMI.   Patient seen and examined. Agree with assessment and plan. No recurrent chest pain.  Trop 0.18/0.19.  Plan repeat cath today; with prior radial difficulty per Dr. Tamala Julian, recommend R femoral approach.   Troy Sine, MD, [facility] 03/25/2017 10:02 AM

## 2017-03-25 NOTE — Plan of Care (Signed)
  Activity: Risk for activity intolerance will decrease 03/25/2017 2224 - Completed/Met by Claudine Mouton, RN Pt. Up ad lib ambulate independently

## 2017-03-25 NOTE — Progress Notes (Signed)
Site area:Right groin a 5 french arterial sheath was removed  Site Prior to Removal:  Level 0  Pressure Applied For 20 MINUTES    Bedrest Beginning at 1630p  Manual:   Yes.    Patient Status During Pull:  stable  Post Pull Groin Site:  Level 0  Post Pull Instructions Given:  Yes.    Post Pull Pulses Present:  Yes.    Dressing Applied:  Yes.    Comments:  VS remain stable

## 2017-03-25 NOTE — Progress Notes (Addendum)
Progress Note  Patient Name: [name] [name] Date of Encounter: 03/25/2017  Primary Cardiologist: Shirlee More, MD  Subjective   No chest pain. Planned for cath today.   Inpatient Medications    Scheduled Meds: . amLODipine  5 mg Oral Daily  . [START ON 03/26/2017] aspirin EC  81 mg Oral Daily  . atorvastatin  80 mg Oral q1800  . clopidogrel  75 mg Oral Daily  . hydrocortisone cream   Topical QID  . metoprolol tartrate  25 mg Oral BID  . pantoprazole  40 mg Oral Q0600  . potassium chloride  20 mEq Oral Daily  . sodium chloride flush  3 mL Intravenous Q12H  . sodium chloride flush  3 mL Intravenous Q12H   Continuous Infusions: . sodium chloride    . sodium chloride    . sodium chloride 1 mL/kg/hr (03/25/17 0624)  . heparin 1,150 Units/hr (03/24/17 1924)   PRN Meds: sodium chloride, sodium chloride, acetaminophen, nitroGLYCERIN, ondansetron (ZOFRAN) IV, phenol, polyvinyl alcohol, sodium chloride flush, sodium chloride flush   Vital Signs    Vitals:   03/24/17 1400 03/24/17 1708 03/24/17 1918 03/25/17 0357  BP: 114/76 124/83 (!) 146/92 130/80  Pulse: 62 77 84 70  Resp: 20 18 17 16   Temp: 98.1 F (36.7 C) 98.4 F (36.9 C) 97.7 F (36.5 C) 97.6 F (36.4 C)  TempSrc: Oral Oral Oral Oral  SpO2: 96% 98% 98% 97%  Weight:    206 lb 9.6 oz (93.7 kg)  Height:        Intake/Output Summary (Last 24 hours) at 03/25/2017 0748 Last data filed at 03/25/2017 0700 Gross per 24 hour  Intake 1695.25 ml  Output 1425 ml  Net 270.25 ml   Filed Weights   03/23/17 0500 03/24/17 0425 03/25/17 0357  Weight: 216 lb 7.9 oz (98.2 kg) 203 lb 3.2 oz (92.2 kg) 206 lb 9.6 oz (93.7 kg)    Telemetry    SR with occ PVCs - Personally Reviewed  ECG    N/a - Personally Reviewed  Physical Exam   General: Well developed, well nourished, male appearing in no acute distress. Head: Normocephalic, atraumatic.  Neck: Supple without bruits, JVD. Lungs:  Resp regular and unlabored, CTA. Heart:  RRR, S1, S2, no S3, S4, or murmur; no rub. Abdomen: Soft, non-tender, non-distended with normoactive bowel sounds. No hepatomegaly. No rebound/guarding. No obvious abdominal masses. Extremities: No clubbing, cyanosis, edema. Distal pedal pulses are 2+ bilaterally. Neuro: Alert and oriented X 3. Moves all extremities spontaneously. Psych: Normal affect.  Labs    Chemistry Recent Labs  Lab 03/23/17 0246  NA 138  K 3.8  CL 106  CO2 23  GLUCOSE 90  BUN 15  CREATININE 0.95  CALCIUM 8.8*  GFRNONAA >60  GFRAA >60  ANIONGAP 9     Hematology Recent Labs  Lab 03/23/17 0246 03/24/17 0858 03/25/17 0350  WBC 6.9 6.9 5.9  RBC 4.15* 4.11* 3.98*  HGB 13.9 14.0 13.3  HCT 40.6 40.7 39.6  MCV 97.8 99.0 99.5  MCH 33.5 34.1* 33.4  MCHC 34.2 34.4 33.6  RDW 12.2 12.4 12.4  PLT 156 158 147*    Cardiac Enzymes Recent Labs  Lab 03/22/17 2114 03/23/17 0246  TROPONINI 0.19* 0.18*   No results for input(s): TROPIPOC in the last 168 hours.   BNPNo results for input(s): BNP, PROBNP in the last 168 hours.   DDimer No results for input(s): DDIMER in the last 168 hours.    Radiology  No results found.  Cardiac Studies   N/a   Patient Profile     77 y.o. male with history of CAD (s/p stent to Cx 2009, recent NSTEMI s/p DES to mLCx and m/dRCA), HTN, HLD (previous refusal of statins until recently), prostate CA, PVCs, remote tobacco abuse who presented back to the hospital with recurrent CP. Transferred from [place] to [place] for further work up.   Assessment & Plan    1. CAD/elevated troponin: Presented with prolonged episode of chest pain in the setting of recent stenting the RCA/LCx. No further chest pain since admission to [facility]. Planned for cath today. The patient understands that risks included but are not limited to stroke (1 in 1000), death (1 in 79), kidney failure [usually temporary] (1 in 500), bleeding (1 in 200), allergic reaction [possibly serious] (1 in 200).  --  remains on ASA, Statin, BB, plavix and heparin  2. PVCs: occ noted on telemetry. Appears to be asymptomatic. Electrolytes stable.  -- continue BB  3. HL: recently started on high dose statin during last admission. LDL 60 at office visit on 03/15/17. Continue the same.   4. HTN: stable with current therapy.   Signed, Reino Bellis, NP  03/25/2017, 7:48 AM  Pager # [phone]   For questions or updates, please contact [place] Please consult [email or web address] for contact info under Cardiology/STEMI.   Patient seen and examined. Agree with assessment and plan. No recurrent chest pain.  Trop 0.18/0.19.  Plan repeat cath today; with prior radial difficulty per Dr. Tamala Julian, recommend R femoral approach.   Troy Sine, MD, [facility] 03/25/2017 10:02 AM

## 2017-03-25 NOTE — Progress Notes (Signed)
ANTICOAGULATION CONSULT NOTE - Follow Up Consult  Pharmacy Consult for Heparin Indication: chest pain/ACS  Allergies  Allergen Reactions  . Ativan [Lorazepam] Other (See Comments)    "Sees things."    Patient Measurements: Height: 6\' 3"  (190.5 cm) Weight: 206 lb 9.6 oz (93.7 kg) IBW/kg (Calculated) : 84.5 Heparin Dosing Weight: 94 kg  Vital Signs: Temp: 97.6 F (36.4 C) (03/04 0357) Temp Source: Oral (03/04 0357) BP: 121/80 (03/04 0819) Pulse Rate: 68 (03/04 0823)  Labs: Recent Labs    03/22/17 2114  03/23/17 0246  03/23/17 1833 03/24/17 0858 03/25/17 0350  HGB  --    < > 13.9  --   --  14.0 13.3  HCT  --   --  40.6  --   --  40.7 39.6  PLT  --   --  156  --   --  158 147*  LABPROT  --   --   --   --   --   --  14.9  INR  --   --   --   --   --   --  1.18  HEPARINUNFRC  --   --  0.38   < > 0.40 0.47 0.40  CREATININE  --   --  0.95  --   --   --   --   TROPONINI 0.19*  --  0.18*  --   --   --   --    < > = values in this interval not displayed.    Estimated Creatinine Clearance: 80.3 mL/min (by C-G formula based on SCr of 0.95 mg/dL).  Assessment:  53 YOM on heparin for ACS r/o, PMH significant for recent NSTEMI in Feb s/p cath/DES x 2.   Heparin level remain therapeutic (0.40) on 1150 units/hr.   For cardiac cath today.  Goal of Therapy:  Heparin level 0.3-0.7 units/ml Monitor platelets by anticoagulation protocol: Yes   Plan:   Continue heparin drip at 1150 units/hr.  Daily heparin level and CBC while on heparin.  Follow up post-cath.  Arty Baumgartner, [place] Pager: [phone] or [phone] 03/25/2017,11:02 AM

## 2017-03-25 NOTE — Interval H&P Note (Signed)
Cath Lab Visit (complete for each Cath Lab visit)  Clinical Evaluation Leading to the Procedure:   ACS: Yes.    Non-ACS:    Anginal Classification: CCS III  Anti-ischemic medical therapy: Minimal Therapy (1 class of medications)  Non-Invasive Test Results: No non-invasive testing performed  Prior CABG: No previous CABG      History and Physical Interval Note:  03/25/2017 3:15 PM  [name] [name]  has presented today for surgery, with the diagnosis of NSTEMI  The various methods of treatment have been discussed with the patient and family. After consideration of risks, benefits and other options for treatment, the patient has consented to  Procedure(s): LEFT HEART CATH AND CORONARY ANGIOGRAPHY (N/A) as a surgical intervention .  The patient's history has been reviewed, patient examined, no change in status, stable for surgery.  I have reviewed the patient's chart and labs.  Questions were answered to the patient's satisfaction.     Quay Burow

## 2017-03-26 ENCOUNTER — Encounter (HOSPITAL_COMMUNITY): Payer: Self-pay | Admitting: Cardiovascular Disease

## 2017-03-26 ENCOUNTER — Other Ambulatory Visit: Payer: Self-pay | Admitting: Cardiology

## 2017-03-26 LAB — BASIC METABOLIC PANEL
ANION GAP: 8 (ref 5–15)
BUN: 14 mg/dL (ref 6–20)
CALCIUM: 8.9 mg/dL (ref 8.9–10.3)
CO2: 23 mmol/L (ref 22–32)
Chloride: 108 mmol/L (ref 101–111)
Creatinine, Ser: 0.97 mg/dL (ref 0.61–1.24)
GFR calc Af Amer: >60 mL/min (ref >60)
GLUCOSE: 89 mg/dL (ref 65–99)
POTASSIUM: 4 mmol/L (ref 3.5–5.1)
SODIUM: 139 mmol/L (ref 135–145)

## 2017-03-26 LAB — CBC
HCT: 40.1 % (ref 39.0–52.0)
Hemoglobin: 13.7 g/dL (ref 13.0–17.0)
MCH: 33.6 pg (ref 26.0–34.0)
MCHC: 34.2 g/dL (ref 30.0–36.0)
MCV: 98.3 fL (ref 78.0–100.0)
PLATELETS: 154 10*3/uL (ref 150–400)
RBC: 4.08 MIL/uL — ABNORMAL LOW (ref 4.22–5.81)
RDW: 12.3 % (ref 11.5–15.5)
WBC: 6.4 10*3/uL (ref 4.0–10.5)

## 2017-03-26 MED ORDER — PANTOPRAZOLE SODIUM 40 MG PO TBEC: 40.0000 mg | DELAYED_RELEASE_TABLET | Freq: Every day | ORAL | 0 refills | Status: DC

## 2017-03-26 MED ORDER — ASPIRIN 81 MG PO TABS: 81.0000 mg | ORAL_TABLET | Freq: Every day | ORAL | Status: AC

## 2017-03-26 NOTE — Progress Notes (Signed)
The patient has been given discharge instructions along with a new medication list and what to take today. He has new prescriptions and the office will call him for a follow up appointment. He has been educated on cardiac site care. He will be discharging with his wife via car.   Saddie Benders RN

## 2017-03-26 NOTE — Discharge Summary (Addendum)
Discharge Summary    Patient ID: [name] [name],  MRN: [id number], DOB/AGE: [birth year] 77 y.o.  Admit date: 03/22/2017 Discharge date: 03/26/2017  Primary Care Provider: Helen Hashimoto. Primary Cardiologist: Dr. Bettina Gavia  Discharge Diagnoses    Active Problems:   NSTEMI (non-ST elevated myocardial infarction) [facility])   Coronary artery disease involving native coronary artery of native heart with unstable angina pectoris ([place])   Hyperlipidemia   Essential hypertension   PVCs (premature ventricular contractions)   Unstable angina (HCC)   Allergies Allergies  Allergen Reactions  . Ativan [Lorazepam] Other (See Comments)    "Sees things."    Diagnostic Studies/Procedures    Cath: 03/25/17  Conclusion     Previously placed Mid RCA stent (unknown type) is widely patent.  Previously placed Prox Cx to Mid Cx stent (unknown type) is widely patent.  Prox Cx lesion is 50% stenosed.  Prox LAD lesion is 30% stenosed.  The left ventricular systolic function is normal.   LV end diastolic pressure is normal.  The left ventricular ejection fraction is 55-65% by visual estimate.    _____________   History of Present Illness     77 y.o. male with history of CAD (s/p stent to Cx 2009, recent NSTEMI s/p DES to mLCx and m/dRCA), HTN, HLD (previous refusal of statins until recently), prostate CA, PVCs, remote tobacco abuse who presented back to the hospital with recurrent CP. He was recently admitted with chest pain and troponin peak of 1.44.  He underwent cath 7/07 with complicated findings; Dr. Tamala Julian preferred to discuss with colleagues and this ultimately resulted in re-cath 2/12 with DES to mid LCx and DES to mid distal RCA. 2D echo showed EF 50-55%, grade 1 DD, mod RAE, mild-mod TR, PASP 69mHg. PVCs referenced on tele at that time. He saw Dr. MBettina Gaviaback in f/u and was doing well. However, on Monday 2/25 he was admitted overnight for chest pain rule out and was discharged home.  Last night around 2:30am he developed diarrhea x1. When he got back to bed he began having chest pain and left arm pain. He took 1 SL NTG and 2 baby aspirin with relief but pain persisted so he went to the ER around 4am. Pain finally eased off after 4-5 hours. W/u in the ER showed rising troponin with initial value 0.07 -> 0.24 (<0.04 negative, 0.04-0.4 is gray zone, >0.40 positive). Otherwise labs showed WBC 8.0, Hgb 15.1, Hct 42.5, Plt 191, PT 10.7, INR 1.1, Na 137, K 3.7, Cl 104, Cr 0.90, glucose 115, calcium 9, Tbili 0.8, AST 41, ALT 33, alk phos 77. CXR showed no active disease. It appears he received 537mhr normal saline, 7512mf Plavix and 30m92m enoxaparin. I do not see where he got any aspirin or other home meds. He was transferred to [facility] further evaluation and remains chest pain free and comfortable. Otherwise no recent symptoms. No further diarrhea.  Hospital Course     Underwent cardiac cath noted above with patent stents noted in the RCA and LCx. Normal LVEDP and normal LV function. Suspect troponins were continuation of downtrend from previous admission. No further chest pain. No complications noted post cath. Added protonix this admission for suspected GERD. Morning labs were stable.  General: Well developed, well nourished, male appearing in no acute distress. Head: Normocephalic, atraumatic.  Neck: Supple without bruits, JV. Lungs:  Resp regular and unlabored, CTA. Heart: RRR, S1, S2, no S3, S4, or murmur; no rub. Abdomen:  Soft, non-tender, non-distended with normoactive bowel sounds. No hepatomegaly. No rebound/guarding. No obvious abdominal masses. Extremities: No clubbing, cyanosis, edema. Distal pedal pulses are 2+ bilaterally. R femoral cath site stable without bruising or hematoma Neuro: Alert and oriented X 3. Moves all extremities spontaneously. Psych: Normal affect.  Temple Blas was seen by Dr. Claiborne Billings and determined stable for discharge home. Follow up in the office  has been arranged. Medications are listed below.   _____________  Discharge Vitals Blood pressure 123/81, pulse 82, temperature 98.4 F (36.9 C), temperature source Oral, resp. rate 18, height 6' 3"  (1.905 m), weight 204 lb 12.8 oz (92.9 kg), SpO2 97 %.  Filed Weights   03/24/17 0425 03/25/17 0357 03/26/17 0541  Weight: 203 lb 3.2 oz (92.2 kg) 206 lb 9.6 oz (93.7 kg) 204 lb 12.8 oz (92.9 kg)    Labs & Radiologic Studies    CBC Recent Labs    03/25/17 0350 03/26/17 0414  WBC 5.9 6.4  HGB 13.3 13.7  HCT 39.6 40.1  MCV 99.5 98.3  PLT 147* 182   Basic Metabolic Panel Recent Labs    03/26/17 0414  NA 139  K 4.0  CL 108  CO2 23  GLUCOSE 89  BUN 14  CREATININE 0.97  CALCIUM 8.9   Liver Function Tests No results for input(s): AST, ALT, ALKPHOS, BILITOT, PROT, ALBUMIN in the last 72 hours. No results for input(s): LIPASE, AMYLASE in the last 72 hours. Cardiac Enzymes No results for input(s): CKTOTAL, CKMB, CKMBINDEX, TROPONINI in the last 72 hours. BNP Invalid input(s): POCBNP D-Dimer No results for input(s): DDIMER in the last 72 hours. Hemoglobin A1C No results for input(s): HGBA1C in the last 72 hours. Fasting Lipid Panel No results for input(s): CHOL, HDL, LDLCALC, TRIG, CHOLHDL, LDLDIRECT in the last 72 hours. Thyroid Function Tests No results for input(s): TSH, T4TOTAL, T3FREE, THYROIDAB in the last 72 hours.  Invalid input(s): FREET3 _____________  Dg Chest 2 View  Result Date: 03/04/2017 CLINICAL DATA:  Chest pain EXAM: CHEST  2 VIEW COMPARISON:  01/03/2016 FINDINGS: The lungs appear clear.  Cardiac and mediastinal contours normal. No pleural effusion identified. Lower thoracic spondylosis. IMPRESSION: 1.  No active cardiopulmonary disease is radiographically apparent. 2. Lower thoracic spondylosis. Electronically Signed   By: Van Clines M.D.   On: 03/04/2017 08:35   Disposition   Pt is being discharged home today in good condition.  Follow-up  Plans & Appointments    Follow-up Information    Richardo Priest, MD Follow up.   Specialty:  Cardiology Why:  The office will call you with a follow up appt.  Contact information: [place] [address] [phone]          Discharge Instructions    Call MD for:  redness, tenderness, or signs of infection (pain, swelling, redness, odor or green/yellow discharge around incision site)   Complete by:  As directed    Diet - low sodium heart healthy   Complete by:  As directed    Discharge instructions   Complete by:  As directed    Groin Site Care Refer to this sheet in the next few weeks. These instructions provide you with information on caring for yourself after your procedure. Your caregiver may also give you more specific instructions. Your treatment has been planned according to current medical practices, but problems sometimes occur. Call your caregiver if you have any problems or questions after your procedure. HOME CARE  INSTRUCTIONS You may shower 24 hours after the procedure. Remove the bandage (dressing) and gently wash the site with plain soap and water. Gently pat the site dry.  Do not apply powder or lotion to the site.  Do not sit in a bathtub, swimming pool, or whirlpool for 5 to 7 days.  No bending, squatting, or lifting anything over 10 pounds (4.5 kg) as directed by your caregiver.  Inspect the site at least twice daily.  Do not drive home if you are discharged the same day of the procedure. Have someone else drive you.  You may drive 24 hours after the procedure unless otherwise instructed by your caregiver.  What to expect: Any bruising will usually fade within 1 to 2 weeks.  Blood that collects in the tissue (hematoma) may be painful to the touch. It should usually decrease in size and tenderness within 1 to 2 weeks.  SEEK IMMEDIATE MEDICAL CARE IF: You have unusual pain at the groin site or down the affected leg.  You have  redness, warmth, swelling, or pain at the groin site.  You have drainage (other than a small amount of blood on the dressing).  You have chills.  You have a fever or persistent symptoms for more than 72 hours.  You have a fever and your symptoms suddenly get worse.  Your leg becomes pale, cool, tingly, or numb.  You have heavy bleeding from the site. Hold pressure on the site. Marland Kitchen  PLEASE DO NOT MISS ANY DOSES OF YOUR PLAVIX!!!!! Also keep a log of you blood pressures and bring back to your follow up appt. Please call the office with any questions.   Patients taking blood thinners should generally stay away from medicines like ibuprofen, Advil, Motrin, naproxen, and Aleve due to risk of stomach bleeding. You may take Tylenol as directed or talk to your primary doctor about alternatives.  Some studies suggest Prilosec/Omeprazole interacts with Plavix. We changed your Prilosec/Omeprazole to the equivalent dose of Protonix for less chance of interaction.   Increase activity slowly   Complete by:  As directed       Discharge Medications     Medication List    TAKE these medications   amLODipine 5 MG tablet Commonly known as:  NORVASC Take 5 mg by mouth every morning.   aspirin 81 MG tablet Take 1 tablet (81 mg total) by mouth daily. What changed:    when to take this  reasons to take this   atorvastatin 80 MG tablet Commonly known as:  LIPITOR Take 1 tablet (80 mg total) by mouth daily at 6 PM.   clopidogrel 75 MG tablet Commonly known as:  PLAVIX Take 75 mg by mouth daily.   LUBRICATING EYE DROPS OP Place 1 drop into both eyes daily as needed (dry eyes).   metoprolol tartrate 25 MG tablet Commonly known as:  LOPRESSOR Take 1 tablet (25 mg total) by mouth 2 (two) times daily.   nitroGLYCERIN 0.4 MG SL tablet Commonly known as:  NITROSTAT Place 1 tablet (0.4 mg total) under the tongue every 5 (five) minutes as needed for chest pain.   NYQUIL COLD & FLU PO Take 15 mLs  by mouth at bedtime as needed (cough/cold).   pantoprazole 40 MG tablet Commonly known as:  PROTONIX Take 1 tablet (40 mg total) by mouth daily at 6 (six) AM. Start taking on:  03/27/2017        Aspirin prescribed at discharge?  Yes High Intensity Statin  Prescribed? (Lipitor 40-72m or Crestor 20-471m: Yes Beta Blocker Prescribed? Yes For EF <40%, was ACEI/ARB Prescribed? No: EF ok ADP Receptor Inhibitor Prescribed? (i.e. Plavix etc.-Includes Medically Managed Patients): Yes For EF <40%, Aldosterone Inhibitor Prescribed? No: EF ok Was EF assessed during THIS hospitalization? Yes Was Cardiac Rehab II ordered? (Included Medically managed Patients): Yes   Outstanding Labs/Studies   N/a   Duration of Discharge Encounter   Greater than 30 minutes including physician time.  Signed, LiReino BellisP-C 03/26/2017, 11:06 AM   Patient seen and examined. Agree with assessment and plan. No recurrent chest pain. Cath data reviewed with pt and wife. R groin site stable.  DC today with f/u with Dr, MuBettina GaviaNow on high potency statin; target LDL < 70.   ThTroy SineMD, [facility]/05/2017 11:13 AM

## 2017-04-12 NOTE — Progress Notes (Signed)
Cardiology Office Note:    Date:  04/15/2017   ID:  WALTHER SANAGUSTIN, DOB [birth year], MRN [id number]  PCP:  Helen Hashimoto., MD  Cardiologist:  Shirlee More, MD    Referring MD: Helen Hashimoto., MD    ASSESSMENT:    1. NSTEMI (non-ST elevated myocardial infarction) ([place])   2. Coronary artery disease involving native coronary artery of native heart with unstable angina pectoris ([place])   3. Essential hypertension   4. Pure hypercholesterolemia    PLAN:    In order of problems listed above:  1. Improved continue dual antiplatelet therapy without interruption beta-blocker calcium channel blocker and lipid-lowering therapy.  He will enter cardiac rehabilitation.  He began to use a pillbox for compliance. 2. See above stable 3. Stable continue current treatment BP at target 4. Stable continue high intensity statin I will check a lipid profile with his next visit   Next appointment: 3 months   Medication Adjustments/Labs and Tests Ordered: Current medicines are reviewed at length with the patient today.  Concerns regarding medicines are outlined above.  No orders of the defined types were placed in this encounter.  No orders of the defined types were placed in this encounter.   Chief Complaint  Patient presents with  . Hospitalization Follow-up    History of Present Illness:    [name] [name] is a 77 y.o. male with a hx of CAD, Dyslipidemia, HTN, S/P PCI of LCF 04/15/07 for STEMI and symptomatic hypotension last seen by me 04/25/16. He recently presented 03/04/17 with non STEMI with PCI and stent of mid LCF and OM1 last seen by me 03/15/17.  He was recently admitted to [facility]. Admit date: 03/22/2017 Discharge date: 03/26/2017 Primary Care Provider: Helen Hashimoto. Primary Cardiologist: Dr. Bettina Gavia Discharge Diagnoses    Active Problems:   NSTEMI (non-ST elevated myocardial infarction) [facility])   Coronary artery disease involving native coronary artery of native heart  with unstable angina pectoris ([place])   Hyperlipidemia   Essential hypertension   PVCs (premature ventricular contractions)   Unstable angina [facility])  Diagnostic Studies/Procedures    Cath: 03/25/17 Conclusion    Previously placed Mid RCA stent (unknown type) is widely patent.  Previously placed Prox Cx to Mid Cx stent (unknown type) is widely patent.  Prox Cx lesion is 50% stenosed.  Prox LAD lesion is 30% stenosed.  The left ventricular systolic function is normal.   LV end diastolic pressure is normal.  The left ventricular ejection fraction is 55-65% by visual estimate.   Compliance with diet, lifestyle and medications: Yes In some fashion he did not grasp he sustained a myocardial infarction in his most recent admission in the hospital.  In his wife have a list of medications asking me about them he is not using a pillbox at home I am worried about compliance he assured me he will start to use a 1 week twice a day pillbox to ensure he does not miss medications especially clopidogrel.  Since that admission he has had no chest pain dyspnea palpitations syncope or TIA and anticipates beginning cardiac rehabilitation. Past Medical History:  Diagnosis Date  . Arthritis    "left pinky" (03/04/2017)  . Cerebral vascular malformation    PT'S NEUROLOGIST IS DR. 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 Coronary artery disease    a. s/p stent to Cx 2009. b. recent NSTEMI s/p DES to mLCx and  m/dRCA.  . Essential hypertension 03/06/2017  . Headache(784.0)    POSSIBLE TRIGEMINAL NEURALGIA   . Hyperlipidemia   . Hypertension   . Mass of right kidney    FOUND ON WORK UP FOR ABD PAIN  . Myocardial infarction ([place]) 04-14-07  . NSTEMI (non-ST elevated myocardial infarction) ([place]) 03/04/2017   [name] 03/04/2017  . Prostate cancer ([place])   . PVC's (premature ventricular contractions)   . Umbilical hernia     Past  Surgical History:  Procedure Laterality Date  . CARDIAC CATHETERIZATION  03/04/2017  . CHOLECYSTECTOMY N/A 09/19/2012   Procedure: ROBOTIC ASSISTED LAPAROSCOPIC CHOLECYSTECTOMY WITH UMBILICAL HERNIA REPAIR;  Surgeon: Leighton Ruff, MD;  Location: [facility];  Service: General;  Laterality: N/A;  . CORONARY ANGIOPLASTY WITH STENT PLACEMENT  04/14/07  . CORONARY STENT INTERVENTION N/A 03/05/2017   Procedure: CORONARY STENT INTERVENTION;  Surgeon: Martinique, Peter M, MD;  Location: [place] CV LAB;  Service: Cardiovascular;  Laterality: N/A;  . JOINT REPLACEMENT    . LEFT HEART CATH AND CORONARY ANGIOGRAPHY N/A 03/04/2017   Procedure: LEFT HEART CATH AND CORONARY ANGIOGRAPHY;  Surgeon: Belva Crome, MD;  Location: [place] CV LAB;  Service: Cardiovascular;  Laterality: N/A;  . LEFT HEART CATH AND CORONARY ANGIOGRAPHY N/A 03/25/2017   Procedure: LEFT HEART CATH AND CORONARY ANGIOGRAPHY;  Surgeon: Lorretta Harp, MD;  Location: [place] CV LAB;  Service: Cardiovascular;  Laterality: N/A;  . RADIOACTIVE SEED IMPLANT FOR PROSTATE CANCER  2 22 2006  . REPLACEMENT TOTAL KNEE Left 05/30/2010  . ROBOTIC ASSITED PARTIAL NEPHRECTOMY Right 09/19/2012   Procedure: ROBOTIC ASSITED PARTIAL NEPHRECTOMY;  Surgeon: Molli Hazard, MD;  Location: [facility];  Service: Urology;  Laterality: Right;  . SHOULDER OPEN ROTATOR CUFF REPAIR Left 03/30/2008  . TOTAL HIP ARTHROPLASTY Right 01/2001  . TOTAL HIP REVISION  07/2008   acetabular revision  . ULTRASOUND GUIDANCE FOR VASCULAR ACCESS  03/05/2017   Procedure: Ultrasound Guidance For Vascular Access;  Surgeon: Martinique, Peter M, MD;  Location: [place] CV LAB;  Service: Cardiovascular;;    Current Medications: Current Meds  Medication Sig  . amLODipine (NORVASC) 5 MG tablet Take 5 mg by mouth every morning.   Marland Kitchen aspirin 81 MG tablet Take 1 tablet (81 mg total) by mouth daily.  Marland Kitchen atorvastatin (LIPITOR) 80 MG tablet Take 1 tablet (80 mg total) by mouth daily at  6 PM.  . Carboxymethylcellul-Glycerin (LUBRICATING EYE DROPS OP) Place 1 drop into both eyes daily as needed (dry eyes).  . clopidogrel (PLAVIX) 75 MG tablet Take 75 mg by mouth daily.  Marland Kitchen DM-Doxylamine-Acetaminophen (NYQUIL COLD & FLU PO) Take 15 mLs by mouth at bedtime as needed (cough/cold).  . metoprolol tartrate (LOPRESSOR) 25 MG tablet Take 1 tablet (25 mg total) by mouth 2 (two) times daily.  . nitroGLYCERIN (NITROSTAT) 0.4 MG SL tablet Place 1 tablet (0.4 mg total) under the tongue every 5 (five) minutes as needed for chest pain.  . pantoprazole (PROTONIX) 40 MG tablet Take 1 tablet (40 mg total) by mouth daily at 6 (six) AM.     Allergies:   Ativan [lorazepam]   Social History   Socioeconomic History  . Marital status: Married    Spouse name: Not on file  . Number of children: Not on file  . Years of education: Not on file  . Highest education level: Not on file  Occupational History  . Not on file  Social Needs  . Financial resource strain: Not  on file  . Food insecurity:    Worry: Not on file    Inability: Not on file  . Transportation needs:    Medical: Not on file    Non-medical: Not on file  Tobacco Use  . Smoking status: Former Smoker    Packs/day: 1.00    Years: 30.00    Pack years: 30.00    Types: Cigarettes  . Smokeless tobacco: Never Used  . Tobacco comment: "stopped ~ 1980"  Substance and Sexual Activity  . Alcohol use: No  . Drug use: No  . Sexual activity: Not on file  Lifestyle  . Physical activity:    Days per week: Not on file    Minutes per session: Not on file  . Stress: Not on file  Relationships  . Social connections:    Talks on phone: Not on file    Gets together: Not on file    Attends religious service: Not on file    Active member of club or organization: Not on file    Attends meetings of clubs or organizations: Not on file    Relationship status: Not on file  Other Topics Concern  . Not on file  Social History Narrative  .  Not on file     Family History: The patient's family history includes Heart attack in his mother. ROS:   Please see the history of present illness.    All other systems reviewed and are negative.  EKGs/Labs/Other Studies Reviewed:    The following studies were reviewed today:  EKG:  EKG ordered today.  The ekg ordered today demonstrates sinus rhythm no ischemic changes unchanged from baseline  Recent Labs: 03/15/2017: ALT 27 03/22/2017: Magnesium 1.8 03/26/2017: BUN 14; Creatinine, Ser 0.97; Hemoglobin 13.7; Platelets 154; Potassium 4.0; Sodium 139  Recent Lipid Panel    Component Value Date/Time   CHOL 111 03/15/2017 0835   TRIG 70 03/15/2017 0835   HDL 37 (L) 03/15/2017 0835   CHOLHDL 3.0 03/15/2017 0835   CHOLHDL 4.3 03/05/2017 0350   VLDL 24 03/05/2017 0350   LDLCALC 60 03/15/2017 0835    Physical Exam:    VS:  Wt 212 lb 6.4 oz (96.3 kg)   BMI 26.55 kg/m     Wt Readings from Last 3 Encounters:  04/15/17 212 lb 6.4 oz (96.3 kg)  03/26/17 204 lb 12.8 oz (92.9 kg)  03/15/17 213 lb 6.4 oz (96.8 kg)     GEN:  Well nourished, well developed in no acute distress HEENT: Normal NECK: No JVD; No carotid bruits LYMPHATICS: No lymphadenopathy CARDIAC: RRR, no murmurs, rubs, gallops RESPIRATORY:  Clear to auscultation without rales, wheezing or rhonchi  ABDOMEN: Soft, non-tender, non-distended MUSCULOSKELETAL:  No edema; No deformity  SKIN: Warm and dry NEUROLOGIC:  Alert and oriented x 3 PSYCHIATRIC:  Normal affect    Signed, Shirlee More, MD  04/15/2017 10:27 AM    [place]

## 2017-04-15 ENCOUNTER — Ambulatory Visit (INDEPENDENT_AMBULATORY_CARE_PROVIDER_SITE_OTHER): Payer: PPO | Admitting: Cardiology

## 2017-04-15 ENCOUNTER — Encounter: Payer: Self-pay | Admitting: Cardiology

## 2017-04-15 VITALS — BP 110/78 | HR 62 | Ht 75.0 in | Wt 212.4 lb

## 2017-04-15 DIAGNOSIS — I25110 Atherosclerotic heart disease of native coronary artery with unstable angina pectoris: Secondary | ICD-10-CM | POA: Diagnosis not present

## 2017-04-15 DIAGNOSIS — I10 Essential (primary) hypertension: Secondary | ICD-10-CM

## 2017-04-15 DIAGNOSIS — E7800 Pure hypercholesterolemia, unspecified: Secondary | ICD-10-CM | POA: Diagnosis not present

## 2017-04-15 DIAGNOSIS — I214 Non-ST elevation (NSTEMI) myocardial infarction: Secondary | ICD-10-CM | POA: Diagnosis not present

## 2017-04-15 MED ORDER — PANTOPRAZOLE SODIUM 40 MG PO TBEC: 40.0000 mg | DELAYED_RELEASE_TABLET | Freq: Every day | ORAL | 0 refills | Status: DC

## 2017-04-15 NOTE — Patient Instructions (Signed)
Medication Instructions:  Your physician recommends that you continue on your current medications as directed. Please refer to the Current Medication list given to you today.  Labwork: None  Testing/Procedures: You had an EKG today.  Follow-Up: Your physician wants you to follow-up in: 3 months. You will receive a reminder letter in the mail two months in advance. If you don't receive a letter, please call our office to schedule the follow-up appointment.  You will receive a phone call to schedule with cardiac rehab at [facility].  Any Other Special Instructions Will Be Listed Below (If Applicable).     If you need a refill on your cardiac medications before your next appointment, please call your pharmacy.

## 2017-04-18 ENCOUNTER — Other Ambulatory Visit: Payer: Self-pay

## 2017-04-18 DIAGNOSIS — I25110 Atherosclerotic heart disease of native coronary artery with unstable angina pectoris: Secondary | ICD-10-CM

## 2017-04-18 MED ORDER — PANTOPRAZOLE SODIUM 40 MG PO TBEC: 40.0000 mg | DELAYED_RELEASE_TABLET | Freq: Every day | ORAL | 3 refills | Status: DC

## 2017-04-18 NOTE — Telephone Encounter (Signed)
Rx for pantoprazole 40mg  one tablet daily was sent to pharmacy.  Rx was sent in for 30 days by Dr Bettina Gavia at last OV, pt req #[id number].

## 2017-04-22 DIAGNOSIS — Z955 Presence of coronary angioplasty implant and graft: Secondary | ICD-10-CM | POA: Diagnosis not present

## 2017-05-22 DIAGNOSIS — Z955 Presence of coronary angioplasty implant and graft: Secondary | ICD-10-CM | POA: Diagnosis not present

## 2017-05-22 DIAGNOSIS — I252 Old myocardial infarction: Secondary | ICD-10-CM | POA: Diagnosis not present

## 2017-06-24 DIAGNOSIS — I252 Old myocardial infarction: Secondary | ICD-10-CM | POA: Diagnosis not present

## 2017-06-24 DIAGNOSIS — Z955 Presence of coronary angioplasty implant and graft: Secondary | ICD-10-CM | POA: Diagnosis not present

## 2017-06-26 DIAGNOSIS — Z6826 Body mass index (BMI) 26.0-26.9, adult: Secondary | ICD-10-CM | POA: Diagnosis not present

## 2017-06-26 DIAGNOSIS — I252 Old myocardial infarction: Secondary | ICD-10-CM | POA: Diagnosis not present

## 2017-06-26 DIAGNOSIS — I10 Essential (primary) hypertension: Secondary | ICD-10-CM | POA: Diagnosis not present

## 2017-06-26 DIAGNOSIS — K219 Gastro-esophageal reflux disease without esophagitis: Secondary | ICD-10-CM | POA: Diagnosis not present

## 2017-06-26 DIAGNOSIS — E782 Mixed hyperlipidemia: Secondary | ICD-10-CM | POA: Diagnosis not present

## 2017-06-26 DIAGNOSIS — Z79899 Other long term (current) drug therapy: Secondary | ICD-10-CM | POA: Diagnosis not present

## 2017-06-26 DIAGNOSIS — I2510 Atherosclerotic heart disease of native coronary artery without angina pectoris: Secondary | ICD-10-CM | POA: Diagnosis not present

## 2017-07-22 DIAGNOSIS — I252 Old myocardial infarction: Secondary | ICD-10-CM | POA: Diagnosis not present

## 2017-07-22 DIAGNOSIS — Z955 Presence of coronary angioplasty implant and graft: Secondary | ICD-10-CM | POA: Diagnosis not present

## 2017-08-07 ENCOUNTER — Encounter: Payer: Self-pay | Admitting: Cardiology

## 2017-08-07 ENCOUNTER — Ambulatory Visit (INDEPENDENT_AMBULATORY_CARE_PROVIDER_SITE_OTHER): Payer: PPO | Admitting: Cardiology

## 2017-08-07 VITALS — BP 132/76 | HR 62 | Ht 75.0 in | Wt 211.0 lb

## 2017-08-07 DIAGNOSIS — E7800 Pure hypercholesterolemia, unspecified: Secondary | ICD-10-CM | POA: Diagnosis not present

## 2017-08-07 DIAGNOSIS — I10 Essential (primary) hypertension: Secondary | ICD-10-CM

## 2017-08-07 DIAGNOSIS — I25110 Atherosclerotic heart disease of native coronary artery with unstable angina pectoris: Secondary | ICD-10-CM | POA: Diagnosis not present

## 2017-08-07 NOTE — Patient Instructions (Signed)

## 2017-08-07 NOTE — Progress Notes (Signed)
Cardiology Office Note:    Date:  08/07/2017   ID:  [name] [name], DOB 1940-11-16, MRN [id number]  PCP:  [name] Hashimoto., MD  Cardiologist:  [name] More, MD    Referring MD: [name] Hashimoto., MD    ASSESSMENT:    1. Coronary artery disease involving native coronary artery of native heart with unstable angina pectoris ([place])   2. Essential hypertension   3. Pure hypercholesterolemia    PLAN:    In order of problems listed above:  1. Stable course he will remain on dual antiplatelet therapy aspirin Plavix beta-blocker as well as his high intensity statin. 2. Stable blood target continue current treatment beta-blocker long-acting calcium channel blocker 3. Stable continue statin recent lipid profile and liver function requested from his PCP    Next appointment: 3 months   Medication Adjustments/Labs and Tests Ordered: Current medicines are reviewed at length with the patient today.  Concerns regarding medicines are outlined above.  No orders of the defined types were placed in this encounter.  No orders of the defined types were placed in this encounter.   Chief Complaint  Patient presents with  . Coronary Artery Disease    History of Present Illness:    [name] [name] is a 77 y.o. male with a hx of  CAD, Dyslipidemia, HTN, S/P PCI of LCF 04/15/07 for STEMI and symptomatic hypotension last seen by me 04/25/16. He recently presented 03/04/17 with non STEMI with PCI and stent of mid LCF and OM1  and we presented 03/22/2017 with acute coronary syndrome with patent stent. He was last seen by me 03/26/17  Compliance with diet, lifestyle and medications: yes  Is pleased with the quality of his life he has had no shortness of breath chest pain dyspnea palpitation or syncope but is under intense family stress with multiple deaths among friends and cancer in 2 of his relatives that he supervising care Past Medical History:  Diagnosis Date  . Arthritis    "left pinky"  (03/04/2017)  . Cerebral vascular malformation    PT'S NEUROLOGIST IS DR. 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 Coronary artery disease    a. s/p stent to Cx 2009. b. recent NSTEMI s/p DES to mLCx and m/dRCA.  . Essential hypertension 03/06/2017  . Headache(784.0)    POSSIBLE TRIGEMINAL NEURALGIA   . Hyperlipidemia   . Hypertension   . Mass of right kidney    FOUND ON WORK UP FOR ABD PAIN  . Myocardial infarction ([place]) 04-14-07  . NSTEMI (non-ST elevated myocardial infarction) ([place]) 03/04/2017   [name] 03/04/2017  . Prostate cancer ([place])   . PVC's (premature ventricular contractions)   . Umbilical hernia     Past Surgical History:  Procedure Laterality Date  . CARDIAC CATHETERIZATION  03/04/2017  . CHOLECYSTECTOMY N/A 09/19/2012   Procedure: ROBOTIC ASSISTED LAPAROSCOPIC CHOLECYSTECTOMY WITH UMBILICAL HERNIA REPAIR;  Surgeon: Leighton Ruff, MD;  [facility]: [facility];  Service: General;  Laterality: N/A;  . CORONARY ANGIOPLASTY WITH STENT PLACEMENT  04/14/07  . CORONARY STENT INTERVENTION N/A 03/05/2017   Procedure: CORONARY STENT INTERVENTION;  Surgeon: Martinique, Peter M, MD;  Location: [place] [facility];  Service: Cardiovascular;  Laterality: N/A;  . JOINT REPLACEMENT    . LEFT HEART CATH AND CORONARY ANGIOGRAPHY N/A 03/04/2017   Procedure: LEFT HEART CATH AND CORONARY ANGIOGRAPHY;  Surgeon: Belva Crome, MD;  Location: [place] [facility];  Service: Cardiovascular;  Laterality: N/A;  . LEFT HEART CATH AND CORONARY ANGIOGRAPHY N/A 03/25/2017   Procedure: LEFT HEART CATH AND CORONARY ANGIOGRAPHY;  Surgeon: Lorretta Harp, MD;  Location: [place] [facility];  Service: Cardiovascular;  Laterality: N/A;  . RADIOACTIVE SEED IMPLANT FOR PROSTATE CANCER  2 22 2006  . REPLACEMENT TOTAL KNEE Left 05/30/2010  . ROBOTIC ASSITED PARTIAL NEPHRECTOMY Right 09/19/2012   Procedure: ROBOTIC ASSITED PARTIAL NEPHRECTOMY;  Surgeon:  Molli Hazard, MD;  [facility];  Service: Urology;  Laterality: Right;  . SHOULDER OPEN ROTATOR CUFF REPAIR Left 03/30/2008  . TOTAL HIP ARTHROPLASTY Right 01/2001  . TOTAL HIP REVISION  07/2008   acetabular revision  . ULTRASOUND GUIDANCE FOR VASCULAR ACCESS  03/05/2017   Procedure: Ultrasound Guidance For Vascular Access;  Surgeon: Martinique, Peter M, MD;  Location: [facility];  Service: Cardiovascular;;    Current Medications: Current Meds  Medication Sig  . amLODipine (NORVASC) 5 MG tablet Take 5 mg by mouth every morning.   Marland Kitchen aspirin 81 MG tablet Take 1 tablet (81 mg total) by mouth daily.  Marland Kitchen atorvastatin (LIPITOR) 80 MG tablet Take 1 tablet (80 mg total) by mouth daily at 6 PM.  . Carboxymethylcellul-Glycerin (LUBRICATING EYE DROPS OP) Place 1 drop into both eyes daily as needed (dry eyes).  . clopidogrel (PLAVIX) 75 MG tablet Take 75 mg by mouth daily.  Marland Kitchen DM-Doxylamine-Acetaminophen (NYQUIL COLD & FLU PO) Take 15 mLs by mouth at bedtime as needed (cough/cold).  . metoprolol tartrate (LOPRESSOR) 25 MG tablet Take 1 tablet (25 mg total) by mouth 2 (two) times daily.  . nitroGLYCERIN (NITROSTAT) 0.4 MG SL tablet Place 1 tablet (0.4 mg total) under the tongue every 5 (five) minutes as needed for chest pain.  . pantoprazole (PROTONIX) 40 MG tablet Take 1 tablet (40 mg total) by mouth daily at 6 (six) AM.     Allergies:   Ativan [lorazepam]   Social History   Socioeconomic History  . Marital status: Married    Spouse name: Not on file  . Number of children: Not on file  . Years of education: Not on file  . Highest education level: Not on file  Occupational History  . Not on file  Social Needs  . Financial resource strain: Not on file  . Food insecurity:    Worry: Not on file    Inability: Not on file  . Transportation needs:    Medical: Not on file    Non-medical: Not on file  Tobacco Use  . Smoking status: Former Smoker    Packs/day: 1.00    Years:  30.00    Pack years: 30.00    Types: Cigarettes  . Smokeless tobacco: Never Used  . Tobacco comment: "stopped ~ 1980"  Substance and Sexual Activity  . Alcohol use: No  . Drug use: No  . Sexual activity: Not on file  Lifestyle  . Physical activity:    Days per week: Not on file    Minutes per session: Not on file  . Stress: Not on file  Relationships  . Social connections:    Talks on phone: Not on file    Gets together: Not on file    Attends religious service: Not on file    Active member of club or organization: Not on file    Attends meetings of clubs or organizations: Not on file    Relationship status: Not on file  Other Topics Concern  .  Not on file  Social History Narrative  . Not on file     Family History: The patient's family history includes Heart attack in his mother. ROS:   Please see the history of present illness.    All other systems reviewed and are negative.  EKGs/Labs/Other Studies Reviewed:    The following studies were reviewed today  Recent Labs: 03/15/2017: ALT 27 03/22/2017: Magnesium 1.8 03/26/2017: BUN 14; Creatinine, Ser 0.97; Hemoglobin 13.7; Platelets 154; Potassium 4.0; Sodium 139  Recent Lipid Panel    Component Value Date/Time   CHOL 111 03/15/2017 0835   TRIG 70 03/15/2017 0835   HDL 37 (L) 03/15/2017 0835   CHOLHDL 3.0 03/15/2017 0835   CHOLHDL 4.3 03/05/2017 0350   VLDL 24 03/05/2017 0350   LDLCALC 60 03/15/2017 0835    Physical Exam:    VS:  BP 132/76 (BP Location: Right Arm, Patient Position: Sitting, Cuff Size: Normal)   Pulse 62   Ht 6\' 3"  (1.905 m)   Wt 211 lb (95.7 kg)   SpO2 95%   BMI 26.37 kg/m     Wt Readings from Last 3 Encounters:  08/07/17 211 lb (95.7 kg)  04/15/17 212 lb 6.4 oz (96.3 kg)  03/26/17 204 lb 12.8 oz (92.9 kg)     GEN:  Well nourished, well developed in no acute distress HEENT: Normal NECK: No JVD; No carotid bruits LYMPHATICS: No lymphadenopathy CARDIAC: RRR, no murmurs, rubs,  gallops RESPIRATORY:  Clear to auscultation without rales, wheezing or rhonchi  ABDOMEN: Soft, non-tender, non-distended MUSCULOSKELETAL:  No edema; No deformity  SKIN: Warm and dry NEUROLOGIC:  Alert and oriented x 3 PSYCHIATRIC:  Normal affect    Signed, [name] More, MD  08/07/2017 9:26 AM    [place]

## 2017-08-29 DIAGNOSIS — Z6826 Body mass index (BMI) 26.0-26.9, adult: Secondary | ICD-10-CM | POA: Diagnosis not present

## 2017-08-29 DIAGNOSIS — M7022 Olecranon bursitis, left elbow: Secondary | ICD-10-CM | POA: Diagnosis not present

## 2017-08-29 DIAGNOSIS — Z79899 Other long term (current) drug therapy: Secondary | ICD-10-CM | POA: Diagnosis not present

## 2017-11-24 NOTE — Progress Notes (Deleted)
Cardiology Office Note:    Date:  11/24/2017   ID:  [name] [name], DOB 1940/12/15, MRN [id number]  PCP:  [name] Hashimoto., MD  Cardiologist:  [name] More, MD    Referring MD: [name] Hashimoto., MD    ASSESSMENT:    No diagnosis found. PLAN:    In order of problems listed above:  1. ***   Next appointment: ***   Medication Adjustments/Labs and Tests Ordered: Current medicines are reviewed at length with the patient today.  Concerns regarding medicines are outlined above.  No orders of the defined types were placed in this encounter.  No orders of the defined types were placed in this encounter.   No chief complaint on file.   History of Present Illness:     [name] [name] is a 77 y.o. male with a hx of CAD, Dyslipidemia, HTN, S/P PCI of LCF 04/15/07 for STEMI and symptomatic hypotension last seen by me 04/25/16. He  presented 03/04/17 with non STEMI with PCI and stent of mid LCF and OM1  and we presented 03/22/2017 with acute coronary syndrome with patent stent.  He was last seen 08/07/2017. Compliance with diet, lifestyle and medications: *** Past Medical History:  Diagnosis Date  . Arthritis    "left pinky" (03/04/2017)  . Cerebral vascular malformation    PT'S NEUROLOGIST IS DR. 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 Coronary artery disease    a. s/p stent to Cx 2009. b. recent NSTEMI s/p DES to mLCx and m/dRCA.  . Essential hypertension 03/06/2017  . Headache(784.0)    POSSIBLE TRIGEMINAL NEURALGIA   . Hyperlipidemia   . Hypertension   . Mass of right kidney    FOUND ON WORK UP FOR ABD PAIN  . Myocardial infarction ([place]) 04-14-07  . NSTEMI (non-ST elevated myocardial infarction) ([place]) 03/04/2017   [name] [name] 03/04/2017  . Prostate cancer ([place])   . PVC's (premature ventricular contractions)   . Umbilical hernia     Past Surgical History:  Procedure Laterality Date  . CARDIAC  CATHETERIZATION  03/04/2017  . CHOLECYSTECTOMY N/A 09/19/2012   Procedure: ROBOTIC ASSISTED LAPAROSCOPIC CHOLECYSTECTOMY WITH UMBILICAL HERNIA REPAIR;  Surgeon: Leighton Ruff, MD;  [facility];  Service: General;  Laterality: N/A;  . CORONARY ANGIOPLASTY WITH STENT PLACEMENT  04/14/07  . CORONARY STENT INTERVENTION N/A 03/05/2017   Procedure: CORONARY STENT INTERVENTION;  Surgeon: Martinique, Peter M, MD;  Location: [place] [facility];  Service: Cardiovascular;  Laterality: N/A;  . JOINT REPLACEMENT    . LEFT HEART CATH AND CORONARY ANGIOGRAPHY N/A 03/04/2017   Procedure: LEFT HEART CATH AND CORONARY ANGIOGRAPHY;  Surgeon: Belva Crome, MD;  Location: [place];  Service: Cardiovascular;  Laterality: N/A;  . LEFT HEART CATH AND CORONARY ANGIOGRAPHY N/A 03/25/2017   Procedure: LEFT HEART CATH AND CORONARY ANGIOGRAPHY;  Surgeon: Lorretta Harp, MD;  Location: [place] [facility] [facility];  Service: Cardiovascular;  Laterality: N/A;  . RADIOACTIVE SEED IMPLANT FOR PROSTATE CANCER  2 22 2006  . REPLACEMENT TOTAL KNEE Left 05/30/2010  . ROBOTIC ASSITED PARTIAL NEPHRECTOMY Right 09/19/2012   Procedure: ROBOTIC ASSITED PARTIAL NEPHRECTOMY;  Surgeon: Molli Hazard, MD;  [facility];  Service: Urology;  Laterality: Right;  . SHOULDER OPEN ROTATOR CUFF REPAIR Left 03/30/2008  . TOTAL HIP ARTHROPLASTY Right 01/2001  . TOTAL HIP REVISION  07/2008   acetabular revision  . ULTRASOUND GUIDANCE FOR  VASCULAR ACCESS  03/05/2017   Procedure: Ultrasound Guidance For Vascular Access;  Surgeon: Martinique, Peter M, MD;  Location: [facility];  Service: Cardiovascular;;    Current Medications: No outpatient medications have been marked as taking for the 11/25/17 encounter (Appointment) with Richardo Priest, MD.     Allergies:   Ativan [lorazepam]   Social History   Socioeconomic History  . Marital status: Married    Spouse name: Not on file  . Number of children: Not on file  . Years of  education: Not on file  . Highest education level: Not on file  Occupational History  . Not on file  Social Needs  . Financial resource strain: Not on file  . Food insecurity:    Worry: Not on file    Inability: Not on file  . Transportation needs:    Medical: Not on file    Non-medical: Not on file  Tobacco Use  . Smoking status: Former Smoker    Packs/day: 1.00    Years: 30.00    Pack years: 30.00    Types: Cigarettes  . Smokeless tobacco: Never Used  . Tobacco comment: "stopped ~ 1980"  Substance and Sexual Activity  . Alcohol use: No  . Drug use: No  . Sexual activity: Not on file  Lifestyle  . Physical activity:    Days per week: Not on file    Minutes per session: Not on file  . Stress: Not on file  Relationships  . Social connections:    Talks on phone: Not on file    Gets together: Not on file    Attends religious service: Not on file    Active member of club or organization: Not on file    Attends meetings of clubs or organizations: Not on file    Relationship status: Not on file  Other Topics Concern  . Not on file  Social History Narrative  . Not on file     Family History: The patient's ***family history includes Heart attack in his mother. ROS:   Please see the history of present illness.    All other systems reviewed and are negative.  EKGs/Labs/Other Studies Reviewed:    The following studies were reviewed today:  EKG:  EKG ordered today.  The ekg ordered today demonstrates ***  Recent Labs: 03/15/2017: ALT 27 03/22/2017: Magnesium 1.8 03/26/2017: BUN 14; Creatinine, Ser 0.97; Hemoglobin 13.7; Platelets 154; Potassium 4.0; Sodium 139  Recent Lipid Panel    Component Value Date/Time   CHOL 111 03/15/2017 0835   TRIG 70 03/15/2017 0835   HDL 37 (L) 03/15/2017 0835   CHOLHDL 3.0 03/15/2017 0835   CHOLHDL 4.3 03/05/2017 0350   VLDL 24 03/05/2017 0350   LDLCALC 60 03/15/2017 0835    Physical Exam:    VS:  There were no vitals taken for  this visit.    Wt Readings from Last 3 Encounters:  08/07/17 211 lb (95.7 kg)  04/15/17 212 lb 6.4 oz (96.3 kg)  03/26/17 204 lb 12.8 oz (92.9 kg)     GEN: *** Well nourished, well developed in no acute distress HEENT: Normal NECK: No JVD; No carotid bruits LYMPHATICS: No lymphadenopathy CARDIAC: ***RRR, no murmurs, rubs, gallops RESPIRATORY:  Clear to auscultation without rales, wheezing or rhonchi  ABDOMEN: Soft, non-tender, non-distended MUSCULOSKELETAL:  No edema; No deformity  SKIN: Warm and dry NEUROLOGIC:  Alert and oriented x 3 PSYCHIATRIC:  Normal affect    Signed, [name] More, MD  11/24/2017 5:34 PM    [facility]

## 2017-11-25 ENCOUNTER — Ambulatory Visit: Payer: PPO | Admitting: Cardiology

## 2017-12-02 ENCOUNTER — Other Ambulatory Visit: Payer: Self-pay

## 2017-12-25 DIAGNOSIS — Z1331 Encounter for screening for depression: Secondary | ICD-10-CM | POA: Diagnosis not present

## 2017-12-25 DIAGNOSIS — I252 Old myocardial infarction: Secondary | ICD-10-CM | POA: Diagnosis not present

## 2017-12-25 DIAGNOSIS — Z79899 Other long term (current) drug therapy: Secondary | ICD-10-CM | POA: Diagnosis not present

## 2017-12-25 DIAGNOSIS — K219 Gastro-esophageal reflux disease without esophagitis: Secondary | ICD-10-CM | POA: Diagnosis not present

## 2017-12-25 DIAGNOSIS — I10 Essential (primary) hypertension: Secondary | ICD-10-CM | POA: Diagnosis not present

## 2017-12-25 DIAGNOSIS — I2510 Atherosclerotic heart disease of native coronary artery without angina pectoris: Secondary | ICD-10-CM | POA: Diagnosis not present

## 2017-12-25 DIAGNOSIS — E782 Mixed hyperlipidemia: Secondary | ICD-10-CM | POA: Diagnosis not present

## 2017-12-25 DIAGNOSIS — Z6827 Body mass index (BMI) 27.0-27.9, adult: Secondary | ICD-10-CM | POA: Diagnosis not present

## 2017-12-25 DIAGNOSIS — Z1339 Encounter for screening examination for other mental health and behavioral disorders: Secondary | ICD-10-CM | POA: Diagnosis not present

## 2018-01-06 NOTE — Progress Notes (Signed)
Cardiology Office Note:    Date:  01/08/2018   ID:  [name] [name], DOB 1940/08/18, MRN [id number]  PCP:  Helen Hashimoto., MD  Cardiologist:  Shirlee More, MD    Referring MD: Helen Hashimoto., MD    ASSESSMENT:    1. Coronary artery disease involving native coronary artery of native heart with unstable angina pectoris ([place])   2. Essential hypertension   3. Pure hypercholesterolemia    PLAN:    In order of problems listed above:  1. Stable CAD he has had no anginal discomfort New York Heart Association class I we will continue his dual antiplatelet therapy in my opinion indefinitely with cigarette smoking along with beta-blocker and high intensity statin.  I do not think he requires a repeat ischemia evaluation. 2. Stable blood pressure target continue current treatment 3. Stable lipids are ideal recent total cholesterol 125 LDL 65 HDL 38 with normal liver and renal function continue his high intensity statin   Next appointment: 6 months   Medication Adjustments/Labs and Tests Ordered: Current medicines are reviewed at length with the patient today.  Concerns regarding medicines are outlined above.  No orders of the defined types were placed in this encounter.  No orders of the defined types were placed in this encounter.   Chief Complaint  Patient presents with  . Follow-up  . Coronary Artery Disease    History of Present Illness:      [name] [name] is a 77 y.o. male with a hx of CAD, Dyslipidemia, HTN, S/P PCI of LCF 04/15/07 for STEMI , symptomatic hypotension and on 03/04/17 with non STEMI with PCI and stent of mid LCF and OM1  and  presented 03/22/2017 with acute coronary syndrome with patent stent last seen 08/07/2017. Compliance with diet, lifestyle and medications: Yes  Remains quite vigorous active still works 2 days a week and has no exercise intolerance chest pain shortness of breath palpitations syncope or TIA no side effects from his medications no  muscle symptoms from statin and no bleeding complications of continued dual antiplatelet therapy. Past Medical History:  Diagnosis Date  . Arthritis    "left pinky" (03/04/2017)  . Cerebral vascular malformation    PT'S NEUROLOGIST IS DR. 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 Coronary artery disease    a. s/p stent to Cx 2009. b. recent NSTEMI s/p DES to mLCx and m/dRCA.  . Essential hypertension 03/06/2017  . Headache(784.0)    POSSIBLE TRIGEMINAL NEURALGIA   . Hyperlipidemia   . Hypertension   . Mass of right kidney    FOUND ON WORK UP FOR ABD PAIN  . Myocardial infarction ([place]) 04-14-07  . NSTEMI (non-ST elevated myocardial infarction) ([place]) 03/04/2017   [name] 03/04/2017  . Prostate cancer ([place])   . PVC's (premature ventricular contractions)   . Umbilical hernia     Past Surgical History:  Procedure Laterality Date  . CARDIAC CATHETERIZATION  03/04/2017  . CHOLECYSTECTOMY N/A 09/19/2012   Procedure: ROBOTIC ASSISTED LAPAROSCOPIC CHOLECYSTECTOMY WITH UMBILICAL HERNIA REPAIR;  Surgeon: Leighton Ruff, MD;  [facility];  Service: General;  Laterality: N/A;  . CORONARY ANGIOPLASTY WITH STENT PLACEMENT  04/14/07  . CORONARY STENT INTERVENTION N/A 03/05/2017   Procedure: CORONARY STENT INTERVENTION;  Surgeon: Martinique, Peter M, MD;  Location: [place] [facility];  Service: Cardiovascular;  Laterality: N/A;  . JOINT REPLACEMENT    . LEFT HEART CATH  AND CORONARY ANGIOGRAPHY N/A 03/04/2017   Procedure: LEFT HEART CATH AND CORONARY ANGIOGRAPHY;  Surgeon: Belva Crome, MD;  Location: [place] [facility];  Service: Cardiovascular;  Laterality: N/A;  . LEFT HEART CATH AND CORONARY ANGIOGRAPHY N/A 03/25/2017   Procedure: LEFT HEART CATH AND CORONARY ANGIOGRAPHY;  Surgeon: Lorretta Harp, MD;  Location: [place] [facility];  Service: Cardiovascular;  Laterality: N/A;  . RADIOACTIVE SEED IMPLANT FOR PROSTATE CANCER  2 22  2006  . REPLACEMENT TOTAL KNEE Left 05/30/2010  . ROBOTIC ASSITED PARTIAL NEPHRECTOMY Right 09/19/2012   Procedure: ROBOTIC ASSITED PARTIAL NEPHRECTOMY;  Surgeon: Molli Hazard, MD;  [facility];  Service: Urology;  Laterality: Right;  . SHOULDER OPEN ROTATOR CUFF REPAIR Left 03/30/2008  . TOTAL HIP ARTHROPLASTY Right 01/2001  . TOTAL HIP REVISION  07/2008   acetabular revision  . ULTRASOUND GUIDANCE FOR VASCULAR ACCESS  03/05/2017   Procedure: Ultrasound Guidance For Vascular Access;  Surgeon: Martinique, Peter M, MD;  Location: [facility];  Service: Cardiovascular;;    Current Medications: Current Meds  Medication Sig  . amLODipine (NORVASC) 5 MG tablet Take 5 mg by mouth every morning.   Marland Kitchen aspirin 81 MG tablet Take 1 tablet (81 mg total) by mouth daily.  Marland Kitchen atorvastatin (LIPITOR) 80 MG tablet Take 1 tablet (80 mg total) by mouth daily at 6 PM.  . Carboxymethylcellul-Glycerin (LUBRICATING EYE DROPS OP) Place 1 drop into both eyes daily as needed (dry eyes).  . clopidogrel (PLAVIX) 75 MG tablet Take 75 mg by mouth daily.  Marland Kitchen DM-Doxylamine-Acetaminophen (NYQUIL COLD & FLU PO) Take 15 mLs by mouth at bedtime as needed (cough/cold).  . metoprolol tartrate (LOPRESSOR) 25 MG tablet Take 1 tablet (25 mg total) by mouth 2 (two) times daily.  . nitroGLYCERIN (NITROSTAT) 0.4 MG SL tablet Place 1 tablet (0.4 mg total) under the tongue every 5 (five) minutes as needed for chest pain.  . pantoprazole (PROTONIX) 40 MG tablet Take 1 tablet (40 mg total) by mouth daily at 6 (six) AM.     Allergies:   Ativan [lorazepam]   Social History   Socioeconomic History  . Marital status: Married    Spouse name: Not on file  . Number of children: Not on file  . Years of education: Not on file  . Highest education level: Not on file  Occupational History  . Not on file  Social Needs  . Financial resource strain: Not on file  . Food insecurity:    Worry: Not on file    Inability: Not on  file  . Transportation needs:    Medical: Not on file    Non-medical: Not on file  Tobacco Use  . Smoking status: Former Smoker    Packs/day: 1.00    Years: 30.00    Pack years: 30.00    Types: Cigarettes  . Smokeless tobacco: Never Used  . Tobacco comment: "stopped ~ 1980"  Substance and Sexual Activity  . Alcohol use: No  . Drug use: No  . Sexual activity: Not on file  Lifestyle  . Physical activity:    Days per week: Not on file    Minutes per session: Not on file  . Stress: Not on file  Relationships  . Social connections:    Talks on phone: Not on file    Gets together: Not on file    Attends religious service: Not on file    Active member of club or organization: Not  on file    Attends meetings of clubs or organizations: Not on file    Relationship status: Not on file  Other Topics Concern  . Not on file  Social History Narrative  . Not on file     Family History: The patient's family history includes Heart attack in his mother. ROS:   Please see the history of present illness.    All other systems reviewed and are negative.  EKGs/Labs/Other Studies Reviewed:    The following studies were reviewed today:  EKG:  EKG ordered today.  The ekg ordered today demonstrates Srth normal EKG  Recent Labs: 03/15/2017: ALT 27 03/22/2017: Magnesium 1.8 03/26/2017: BUN 14; Creatinine, Ser 0.97; Hemoglobin 13.7; Platelets 154; Potassium 4.0; Sodium 139  Recent Lipid Panel    Component Value Date/Time   CHOL 111 03/15/2017 0835   TRIG 70 03/15/2017 0835   HDL 37 (L) 03/15/2017 0835   CHOLHDL 3.0 03/15/2017 0835   CHOLHDL 4.3 03/05/2017 0350   VLDL 24 03/05/2017 0350   LDLCALC 60 03/15/2017 0835    Physical Exam:    VS:  BP 128/82 (BP Location: Right Arm, Patient Position: Sitting, Cuff Size: Normal)   Pulse 69   Ht 6\' 3"  (1.905 m)   Wt 216 lb 6.4 oz (98.2 kg)   SpO2 96%   BMI 27.05 kg/m     Wt Readings from Last 3 Encounters:  01/08/18 216 lb 6.4 oz (98.2 kg)   08/07/17 211 lb (95.7 kg)  04/15/17 212 lb 6.4 oz (96.3 kg)     GEN:  Well nourished, well developed in no acute distress HEENT: Normal NECK: No JVD; No carotid bruits LYMPHATICS: No lymphadenopathy CARDIAC: RRR, no murmurs, rubs, gallops RESPIRATORY:  Clear to auscultation without rales, wheezing or rhonchi  ABDOMEN: Soft, non-tender, non-distended MUSCULOSKELETAL:  No edema; No deformity  SKIN: Warm and dry NEUROLOGIC:  Alert and oriented x 3 PSYCHIATRIC:  Normal affect    Signed, Shirlee More, MD  01/08/2018 11:02 AM    [place]

## 2018-01-08 ENCOUNTER — Ambulatory Visit (INDEPENDENT_AMBULATORY_CARE_PROVIDER_SITE_OTHER): Payer: PPO | Admitting: Cardiology

## 2018-01-08 ENCOUNTER — Encounter: Payer: Self-pay | Admitting: Cardiology

## 2018-01-08 VITALS — BP 128/82 | HR 69 | Ht 75.0 in | Wt 216.4 lb

## 2018-01-08 DIAGNOSIS — I25110 Atherosclerotic heart disease of native coronary artery with unstable angina pectoris: Secondary | ICD-10-CM | POA: Diagnosis not present

## 2018-01-08 DIAGNOSIS — I10 Essential (primary) hypertension: Secondary | ICD-10-CM

## 2018-01-08 DIAGNOSIS — E7800 Pure hypercholesterolemia, unspecified: Secondary | ICD-10-CM

## 2018-01-08 NOTE — Patient Instructions (Signed)
Medication Instructions:  Your physician recommends that you continue on your current medications as directed. Please refer to the Current Medication list given to you today.  If you need a refill on your cardiac medications before your next appointment, please call your pharmacy.   Lab work: NONE If you have labs (blood work) drawn today and your tests are completely normal, you will receive your results only by: . MyChart Message (if you have MyChart) OR . A paper copy in the mail If you have any lab test that is abnormal or we need to change your treatment, we will call you to review the results.  Testing/Procedures: You had an EKG today  Follow-Up: At [facility], you and your health needs are our priority.  As part of our continuing mission to provide you with exceptional heart care, we have created designated Provider Care Teams.  These Care Teams include your primary Cardiologist (physician) and Advanced Practice Providers (APPs -  Physician Assistants and Nurse Practitioners) who all work together to provide you with the care you need, when you need it. You will need a follow up appointment in 6 months.  Please call our office 2 months in advance to schedule this appointment.    

## 2018-02-03 DIAGNOSIS — J989 Respiratory disorder, unspecified: Secondary | ICD-10-CM | POA: Diagnosis not present

## 2018-02-05 DIAGNOSIS — Z9181 History of falling: Secondary | ICD-10-CM | POA: Diagnosis not present

## 2018-02-05 DIAGNOSIS — Z1331 Encounter for screening for depression: Secondary | ICD-10-CM | POA: Diagnosis not present

## 2018-02-05 DIAGNOSIS — Z0000 Encounter for general adult medical examination without abnormal findings: Secondary | ICD-10-CM | POA: Diagnosis not present

## 2018-02-05 DIAGNOSIS — E785 Hyperlipidemia, unspecified: Secondary | ICD-10-CM | POA: Diagnosis not present

## 2018-02-05 DIAGNOSIS — Z125 Encounter for screening for malignant neoplasm of prostate: Secondary | ICD-10-CM | POA: Diagnosis not present

## 2018-04-28 IMAGING — CR DG CHEST 2V
2 series · 2 of 2 positions shown · non-contrast
Comparison: 01/03/2016

CLINICAL DATA: Chest pain

EXAM:
CHEST  2 VIEW

[chest pa]
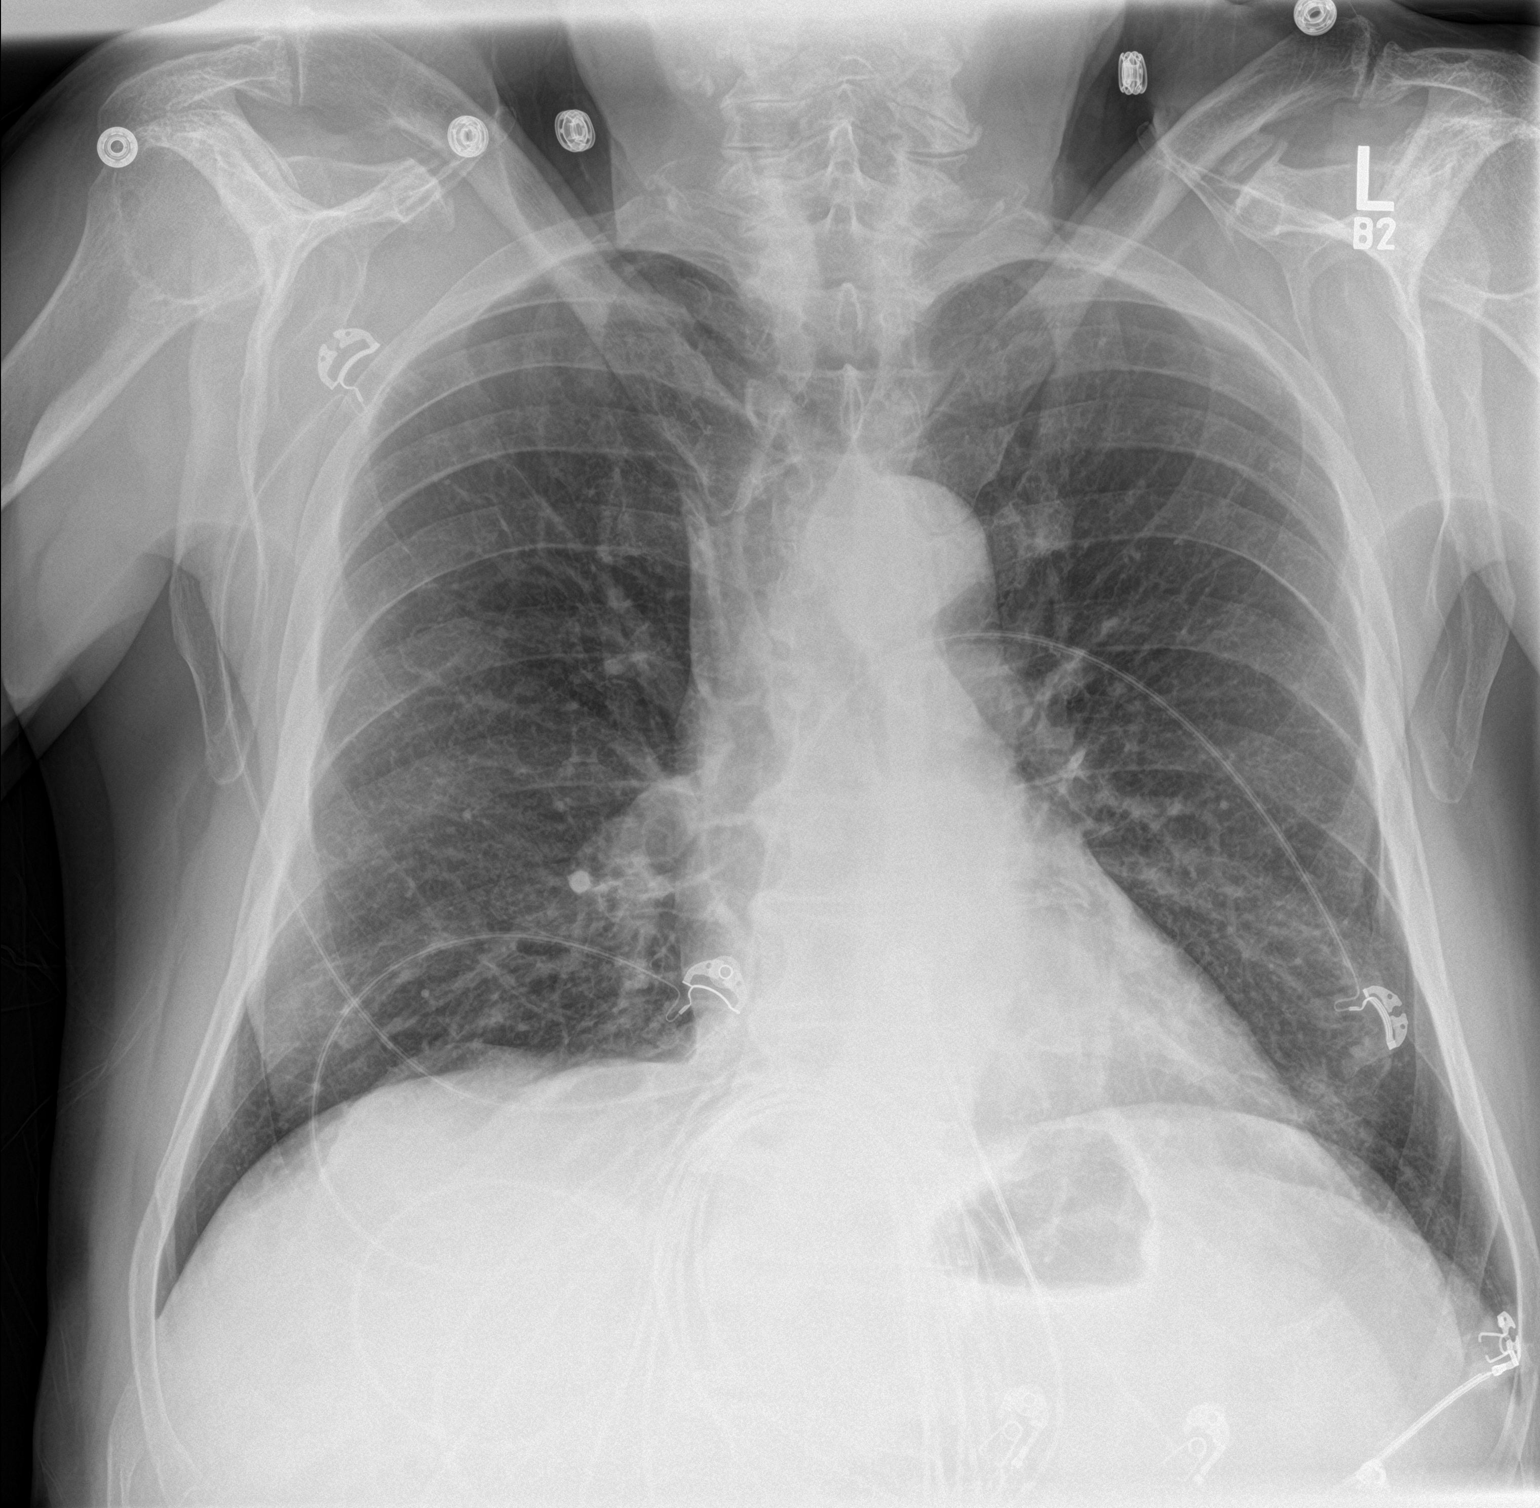

[chest lat]
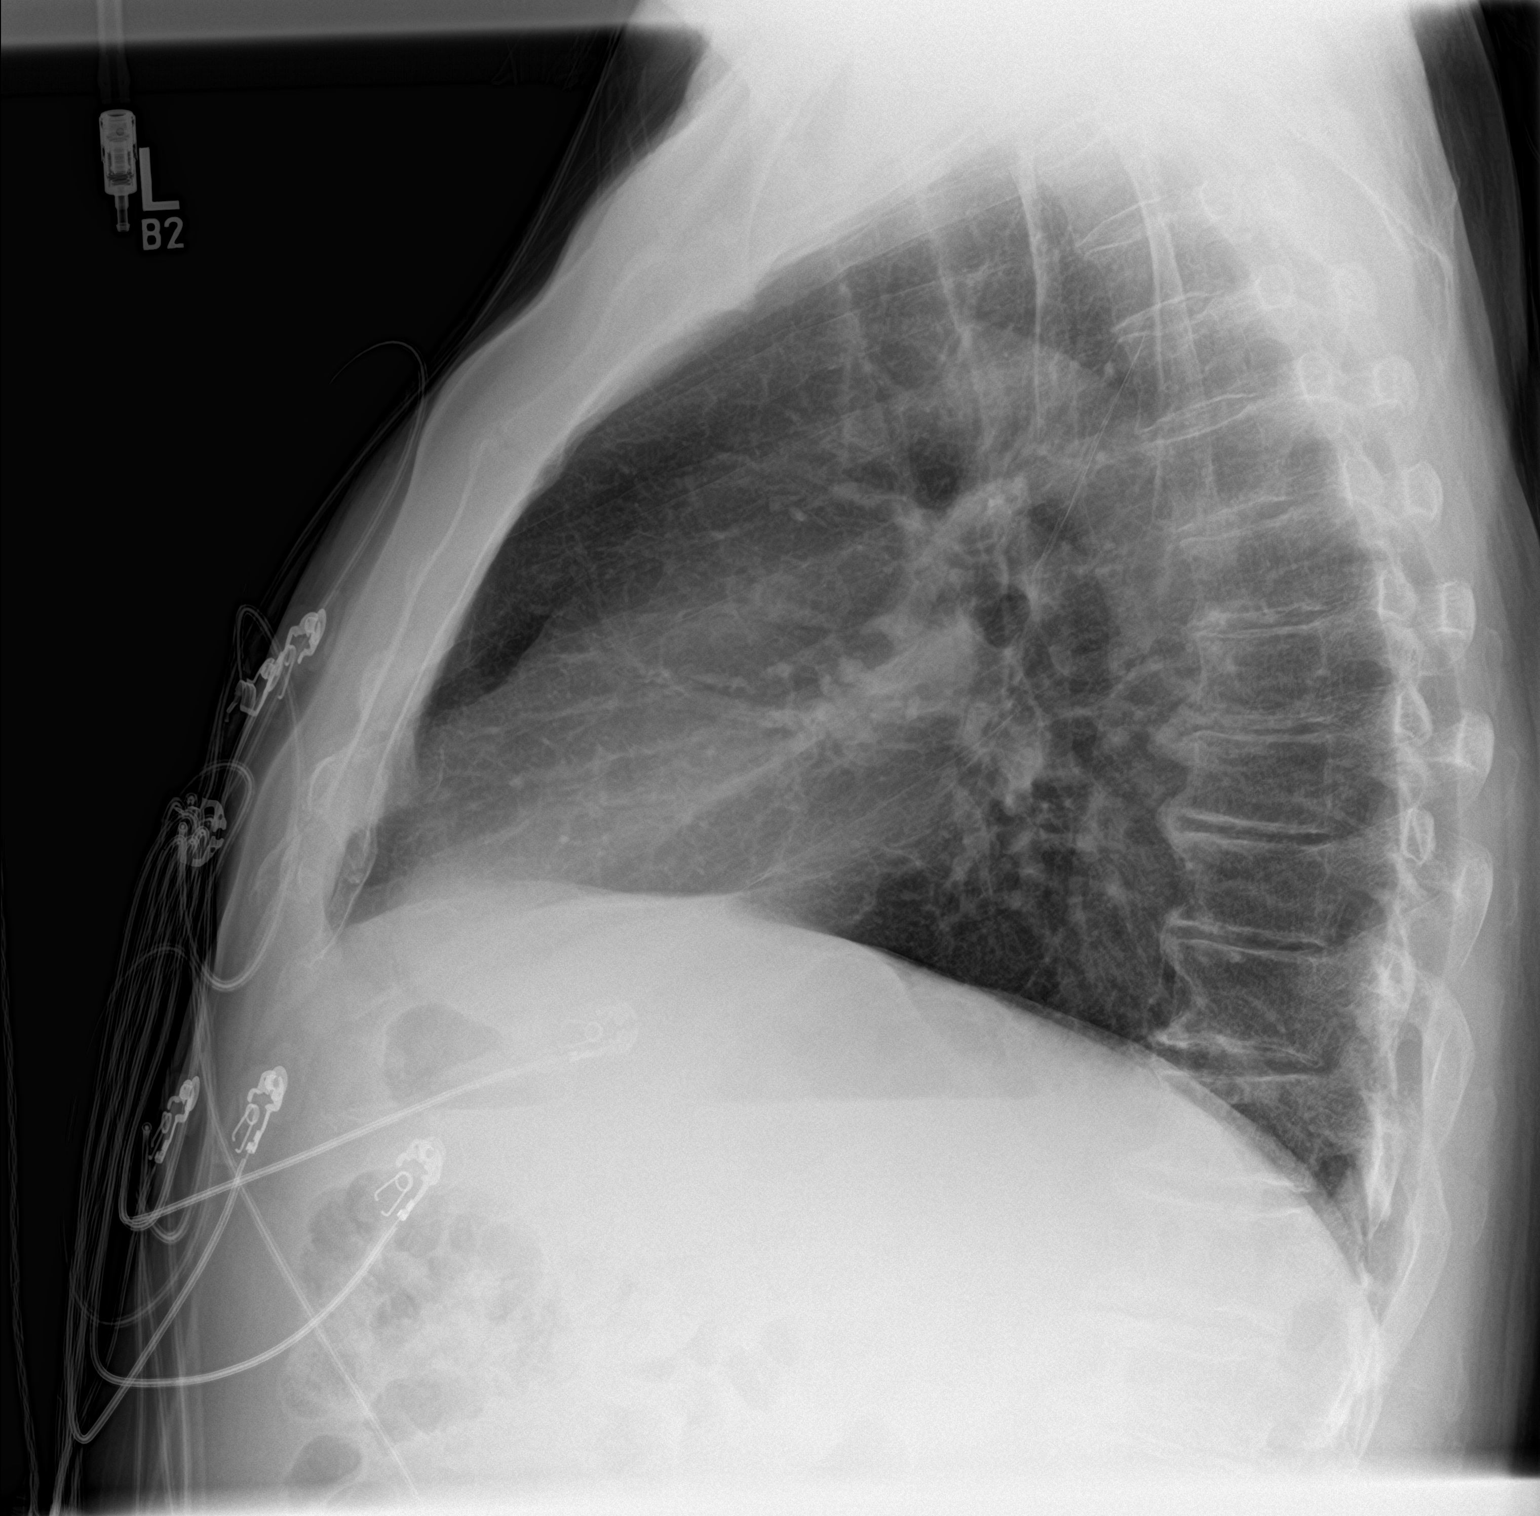

[2 of 2 positions shown; findings below may reference images not displayed]

FINDINGS: The lungs appear clear.  Cardiac and mediastinal contours normal.

No pleural effusion identified.

Lower thoracic spondylosis.
IMPRESSION: 1.  No active cardiopulmonary disease is radiographically apparent.
2. Lower thoracic spondylosis.

## 2018-07-09 ENCOUNTER — Other Ambulatory Visit: Payer: Self-pay | Admitting: Cardiology

## 2018-07-09 DIAGNOSIS — I25110 Atherosclerotic heart disease of native coronary artery with unstable angina pectoris: Secondary | ICD-10-CM

## 2018-07-14 DIAGNOSIS — Z79899 Other long term (current) drug therapy: Secondary | ICD-10-CM | POA: Diagnosis not present

## 2018-07-14 DIAGNOSIS — Z6828 Body mass index (BMI) 28.0-28.9, adult: Secondary | ICD-10-CM | POA: Diagnosis not present

## 2018-07-14 DIAGNOSIS — K219 Gastro-esophageal reflux disease without esophagitis: Secondary | ICD-10-CM | POA: Diagnosis not present

## 2018-07-14 DIAGNOSIS — Z139 Encounter for screening, unspecified: Secondary | ICD-10-CM | POA: Diagnosis not present

## 2018-07-14 DIAGNOSIS — I10 Essential (primary) hypertension: Secondary | ICD-10-CM | POA: Diagnosis not present

## 2018-07-14 DIAGNOSIS — I252 Old myocardial infarction: Secondary | ICD-10-CM | POA: Diagnosis not present

## 2018-07-14 DIAGNOSIS — E782 Mixed hyperlipidemia: Secondary | ICD-10-CM | POA: Diagnosis not present

## 2018-07-14 DIAGNOSIS — I2510 Atherosclerotic heart disease of native coronary artery without angina pectoris: Secondary | ICD-10-CM | POA: Diagnosis not present

## 2018-09-09 NOTE — Progress Notes (Signed)
Cardiology Office Note:    Date:  09/10/2018   ID:  [name] [name], DOB April 14, 1940, MRN [id number]  PCP:  [name] [name]., [name]  Cardiologist:  [name] More, [name]    Referring [name]: [name] [name]., [name]    ASSESSMENT:    1. Coronary artery disease involving native coronary artery of native heart with unstable angina pectoris ([place])   2. Essential hypertension   3. Pure hypercholesterolemia    PLAN:    In order of problems listed above:  1. CAD - Stable. No anginal symptoms. No indication for ischemic evaluation. EKG today without ST/T wave changes. Stays active by working two days per week. GDMT aspirin, plavix, statin, metoprolol, PRN nitroglycerin. DAPT aspirin, plavix - denies bleeding complications.  2. HTN - BP elevated today 158/90 and on my recheck 148/82. Checks his BP intermittently at home and reports readings are "sometimes normal, sometimes high". Increase Amlodipine to 10mg  daily. He will check BP at home and report if SBP consistently >130 of <110. 3. HLD - Lipid profile 06/2018 at PCP total cholesterol 114, triglycerides 106, HDL 35, LDL 58. Normal liver enzymes 07/14/18. LDL at goal of <70. Continue statin.    Next appointment: 6 months   Medication Adjustments/Labs and Tests Ordered: Current medicines are reviewed at length with the patient today.  Concerns regarding medicines are outlined above.  Orders Placed This Encounter  Procedures  . EKG 12-Lead   Meds ordered this encounter  Medications  . amLODipine (NORVASC) 10 MG tablet    Sig: Take 1 tablet (10 mg total) by mouth daily.    Dispense:  90 tablet    Refill:  1    Chief Complaint  Patient presents with  . Follow-up  . Hypertension  . Coronary Artery Disease  . Hyperlipidemia    History of Present Illness:    [name] [name] is a 78 y.o. male with a hx of  CAD, Dyslipidemia, HTN, S/P PCI of LCF 04/15/07 for STEMI , symptomatic hypotension and on 03/04/17 with non STEMI with PCI and stent of  mid LCF and OM1  and  presented 03/22/2017 with acute coronary syndrome with patent stent last  last seen 01/02/2018.  Reports feeling very well. Very pleasant gentleman. 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 Denies chest pain, shortness of breath, DOE, LE edema. Has been checking his BP very intermittently at home and tells me readings are "sometimes good, sometimes high". We discussed increasing his medication today versus having him check routinely for 2 weeks and reporting back - he is agreeable to increase his dose today and will check his BP regularly. Reports he bruises some when he bumps things on the aspirin and plavix, but denies hematuria, melena.   [facility] tells me he thinks his follow up is in December. He had labs in June with his PCP Dr. Megan [name].  Compliance with diet, lifestyle and medications: Yes Past Medical History:  Diagnosis Date  . Arthritis    "left pinky" (03/04/2017)  . Cerebral vascular malformation    PT'S NEUROLOGIST IS DR. 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 Coronary artery disease    a. s/p stent to Cx 2009. b. recent NSTEMI s/p DES to mLCx and [name]/dRCA.  . Essential hypertension 03/06/2017  . Headache(784.0)    POSSIBLE TRIGEMINAL NEURALGIA   . Hyperlipidemia   .  Hypertension   . Mass of right kidney    FOUND ON WORK UP FOR ABD PAIN  . Myocardial infarction ([place]) 04-14-07  . NSTEMI (non-ST elevated myocardial infarction) ([place]) 03/04/2017   [name] [name] 03/04/2017  . Prostate cancer ([place])   . PVC's (premature ventricular contractions)   . Umbilical hernia     Past Surgical History:  Procedure Laterality Date  . CARDIAC CATHETERIZATION  03/04/2017  . CHOLECYSTECTOMY N/A 09/19/2012   Procedure: ROBOTIC ASSISTED LAPAROSCOPIC CHOLECYSTECTOMY WITH UMBILICAL HERNIA REPAIR;  Surgeon: [name] [name];  Location: [facility];  Service: General;   Laterality: N/A;  . CORONARY ANGIOPLASTY WITH STENT PLACEMENT  04/14/07  . CORONARY STENT INTERVENTION N/A 03/05/2017   Procedure: CORONARY STENT INTERVENTION;  Surgeon: [name], [name] [name], [name];  Location: [facility];  Service: Cardiovascular;  Laterality: N/A;  . JOINT REPLACEMENT    . LEFT HEART CATH AND CORONARY ANGIOGRAPHY N/A 03/04/2017   Procedure: LEFT HEART CATH AND CORONARY ANGIOGRAPHY;  Surgeon: [name] Crome, [name];  Location: [facility];  Service: Cardiovascular;  Laterality: N/A;  . LEFT HEART CATH AND CORONARY ANGIOGRAPHY N/A 03/25/2017   Procedure: LEFT HEART CATH AND CORONARY ANGIOGRAPHY;  Surgeon: [name] [name];  Location: [facility];  Service: Cardiovascular;  Laterality: N/A;  . RADIOACTIVE SEED IMPLANT FOR PROSTATE CANCER  2 22 2006  . REPLACEMENT TOTAL KNEE Left 05/30/2010  . ROBOTIC ASSITED PARTIAL NEPHRECTOMY Right 09/19/2012   Procedure: ROBOTIC ASSITED PARTIAL NEPHRECTOMY;  Surgeon: [name] [name];  Location: [facility];  Service: Urology;  Laterality: Right;  . SHOULDER OPEN ROTATOR CUFF REPAIR Left 03/30/2008  . TOTAL HIP ARTHROPLASTY Right 01/2001  . TOTAL HIP REVISION  07/2008   acetabular revision  . ULTRASOUND GUIDANCE FOR VASCULAR ACCESS  03/05/2017   Procedure: Ultrasound Guidance For Vascular Access;  Surgeon: [name], [name] [name], [name];  Location: [facility];  Service: Cardiovascular;;    Current Medications: Current Meds  Medication Sig  . aspirin 81 MG tablet Take 1 tablet (81 mg total) by mouth daily.  Marland Kitchen atorvastatin (LIPITOR) 80 MG tablet Take 1 tablet (80 mg total) by mouth daily at 6 PM.  . Carboxymethylcellul-Glycerin (LUBRICATING EYE DROPS OP) Place 1 drop into both eyes daily as needed (dry eyes).  . clopidogrel (PLAVIX) 75 MG tablet Take 75 mg by mouth daily.  . metoprolol tartrate (LOPRESSOR) 25 MG tablet Take 1 tablet (25 mg total) by mouth 2 (two) times daily.  . nitroGLYCERIN (NITROSTAT) 0.4 MG SL tablet Place 1  tablet (0.4 mg total) under the tongue every 5 (five) minutes as needed for chest pain.  . pantoprazole (PROTONIX) 40 MG tablet TAKE 1 TABLET BY MOUTH DAILY AT 6:00 AM  . [DISCONTINUED] amLODipine (NORVASC) 5 MG tablet Take 5 mg by mouth every morning.      Allergies:   Ativan [lorazepam]   Social History   Socioeconomic History  . Marital status: Married    Spouse name: Not on file  . Number of children: Not on file  . Years of education: Not on file  . Highest education level: Not on file  Occupational History  . Not on file  Social Needs  . Financial resource strain: Not on file  . Food insecurity    Worry: Not on file    Inability: Not on file  . Transportation needs    Medical: Not on file    Non-medical: Not on file  Tobacco Use  .  Smoking status: Former Smoker    Packs/day: 1.00    Years: 30.00    Pack years: 30.00    Types: Cigarettes  . Smokeless tobacco: Never Used  . Tobacco comment: "stopped ~ 1980"  Substance and Sexual Activity  . Alcohol use: No  . Drug use: No  . Sexual activity: Not on file  Lifestyle  . Physical activity    Days per week: Not on file    Minutes per session: Not on file  . Stress: Not on file  Relationships  . Social Herbalist on phone: Not on file    Gets together: Not on file    Attends religious service: Not on file    Active member of club or organization: Not on file    Attends meetings of clubs or organizations: Not on file    Relationship status: Not on file  Other Topics Concern  . Not on file  Social History Narrative  . Not on file     Family History: The patient's family history includes Heart attack in his mother. ROS:   Please see the history of present illness.    Review of Systems  Constitution: Negative for chills, fever and malaise/fatigue.  Cardiovascular: Negative for chest pain, dyspnea on exertion, leg swelling, near-syncope, orthopnea and palpitations.  Respiratory: Negative for cough,  shortness of breath and wheezing.   Gastrointestinal: Negative for melena, nausea and vomiting.  Genitourinary: Negative for hematuria.  Neurological: Negative for dizziness, light-headedness and weakness.    All other systems reviewed and are negative.  EKGs/Labs/Other Studies Reviewed:    The following studies were reviewed today:  EKG:  EKG ordered today and personally reviewed.  The ekg ordered today demonstrates sinus rhythm with no ST/T wave changes.   Recent Labs: 07/14/18 via [facility]: ALT 19, AST 19, creatinine 1.09, GFR 66, K 4.2, Hb 15.9  Recent Lipid Panel 07/14/18 via [facility]: total cholesterol 114, triglycerides 106, HDL 35, LDL 58  Physical Exam:    VS:  BP (!) 158/90 (BP Location: Right Arm, Patient Position: Sitting, Cuff Size: Normal)   Pulse 72   Ht 6\' 3"  (1.905 [name])   Wt 210 lb (95.3 kg)   SpO2 95%   BMI 26.25 kg/[name]     Wt Readings from Last 3 Encounters:  09/10/18 210 lb (95.3 kg)  01/08/18 216 lb 6.4 oz (98.2 kg)  08/07/17 211 lb (95.7 kg)     GEN:  Well nourished, well developed in no acute distress HEENT: Normal NECK: No JVD; No carotid bruits LYMPHATICS: No lymphadenopathy CARDIAC: RRR, no murmurs, rubs, gallops RESPIRATORY:  Clear to auscultation without rales, wheezing or rhonchi  ABDOMEN: Soft, non-tender, non-distended MUSCULOSKELETAL:  No edema; No deformity  SKIN: Warm and dry. Dime sized bruise to L forearm without abrasion.  NEUROLOGIC:  Alert and oriented x 3 PSYCHIATRIC:  Normal affect    Signed, [name] More, [name]  09/10/2018 11:07 AM    [place]

## 2018-09-10 ENCOUNTER — Ambulatory Visit (INDEPENDENT_AMBULATORY_CARE_PROVIDER_SITE_OTHER): Payer: PPO | Admitting: Cardiology

## 2018-09-10 ENCOUNTER — Encounter: Payer: Self-pay | Admitting: Cardiology

## 2018-09-10 ENCOUNTER — Other Ambulatory Visit: Payer: Self-pay

## 2018-09-10 VITALS — BP 158/90 | HR 72 | Ht 75.0 in | Wt 210.0 lb

## 2018-09-10 DIAGNOSIS — I25110 Atherosclerotic heart disease of native coronary artery with unstable angina pectoris: Secondary | ICD-10-CM | POA: Diagnosis not present

## 2018-09-10 DIAGNOSIS — E7800 Pure hypercholesterolemia, unspecified: Secondary | ICD-10-CM | POA: Diagnosis not present

## 2018-09-10 DIAGNOSIS — I10 Essential (primary) hypertension: Secondary | ICD-10-CM

## 2018-09-10 MED ORDER — AMLODIPINE BESYLATE 10 MG PO TABS: 10.0000 mg | ORAL_TABLET | Freq: Every day | ORAL | 1 refills | Status: DC

## 2018-09-10 NOTE — Patient Instructions (Addendum)
Medication Instructions:  Your physician has recommended you make the following change in your medication:   CHANGE Amlodipine to 10mg  per day.   A new prescription for 10mg  tablets (one tablet per day) has been sent to [facility].  You may use up your 5mg  tablets by taking two per day until they are gone.  If you need a refill on your cardiac medications before your next appointment, please call your pharmacy.   Lab work: None today. Labs from June look great!  If you have labs (blood work) drawn today and your tests are completely normal, you will receive your results only by: Marland Kitchen MyChart Message (if you have MyChart) OR . A paper copy in the mail If you have any lab test that is abnormal or we need to change your treatment, we will call you to review the results.  Testing/Procedures: You had an EKG today.  Follow-Up: At [facility], you and your health needs are our priority.  As part of our continuing mission to provide you with exceptional heart care, we have created designated Provider Care Teams.  These Care Teams include your primary Cardiologist (physician) and Advanced Practice Providers (APPs -  Physician Assistants and Nurse Practitioners) who all work together to provide you with the care you need, when you need it. . You will need a follow up appointment in 6 months.  Please call our office 2 months in advance to schedule this appointment.    Any Other Special Instructions Will Be Listed Below (If Applicable).  Check your blood pressure day. If you see systolic blood pressure (the top number) consistently >130 or <110 please call our office.   [facility]  Tips to measure your blood pressure correctly  To determine whether you have hypertension, a medical professional will take a blood pressure reading. How you prepare for the test, the position of your arm, and other factors can change a blood pressure reading by 10% or more. That could be enough to hide high blood  pressure, start you on a drug you don't really need, or lead your doctor to incorrectly adjust your medications. National and international guidelines offer specific instructions for measuring blood pressure. If a doctor, nurse, or medical assistant isn't doing it right, don't hesitate to ask him or her to get with the guidelines. Here's what you can do to ensure a correct reading: . Don't drink a caffeinated beverage or smoke during the 30 minutes before the test. . Sit quietly for five minutes before the test begins. . During the measurement, sit in a chair with your feet on the floor and your arm supported so your elbow is at about heart level. . The inflatable part of the cuff should completely cover at least 80% of your upper arm, and the cuff should be placed on bare skin, not over a shirt. . Don't talk during the measurement. . Have your blood pressure measured twice, with a brief break in between. If the readings are different by 5 points or more, have it done a third time. There are times to break these rules. If you sometimes feel lightheaded when getting out of bed in the morning or when you stand after sitting, you should have your blood pressure checked while seated and then while standing to see if it falls from one position to the next. Because blood pressure varies throughout the day, your doctor will rarely diagnose hypertension on the basis of a single reading. Instead, he or she will want  to confirm the measurements on at least two occasions, usually within a few weeks of one another. The exception to this rule is if you have a blood pressure reading of 180/110 mm Hg or higher. A result this high usually calls for prompt treatment. It's also a good idea to have your blood pressure measured in both arms at least once, since the reading in one arm (usually the right) may be higher than that in the left. A 2014 study in The American Journal of Medicine of nearly 3,400 people found average  arm- to-arm differences in systolic blood pressure of about 5 points. The higher number should be used to make treatment decisions. In 2017, new guidelines from the [employer], the [employer] of Cardiology, and nine other health organizations lowered the diagnosis of high blood pressure to 130/80 mm Hg or higher for all adults. The guidelines also redefined the various blood pressure categories to now include normal, elevated, Stage 1 hypertension, Stage 2 hypertension, and hypertensive crisis (see "Blood pressure categories"). Blood pressure categories  Blood pressure category SYSTOLIC (upper number)  DIASTOLIC (lower number)  Normal Less than 120 mm Hg and Less than 80 mm Hg  Elevated 120-129 mm Hg and Less than 80 mm Hg  High blood pressure: Stage 1 hypertension 130-139 mm Hg or 80-89 mm Hg  High blood pressure: Stage 2 hypertension 140 mm Hg or higher or 90 mm Hg or higher  Hypertensive crisis (consult your doctor immediately) Higher than 180 mm Hg and/or Higher than 120 mm Hg  Source: American Heart Association and American Stroke Association. For more on getting your blood pressure under control, buy Controlling Your Blood Pressure, a Special Health Report from [facility].

## 2018-12-31 DIAGNOSIS — H5203 Hypermetropia, bilateral: Secondary | ICD-10-CM | POA: Diagnosis not present

## 2018-12-31 DIAGNOSIS — D3131 Benign neoplasm of right choroid: Secondary | ICD-10-CM | POA: Diagnosis not present

## 2019-01-14 ENCOUNTER — Encounter: Payer: Self-pay | Admitting: Gastroenterology

## 2019-01-14 DIAGNOSIS — E782 Mixed hyperlipidemia: Secondary | ICD-10-CM | POA: Diagnosis not present

## 2019-01-14 DIAGNOSIS — I2510 Atherosclerotic heart disease of native coronary artery without angina pectoris: Secondary | ICD-10-CM | POA: Diagnosis not present

## 2019-01-14 DIAGNOSIS — Z23 Encounter for immunization: Secondary | ICD-10-CM | POA: Diagnosis not present

## 2019-01-14 DIAGNOSIS — Z1211 Encounter for screening for malignant neoplasm of colon: Secondary | ICD-10-CM | POA: Diagnosis not present

## 2019-01-14 DIAGNOSIS — I10 Essential (primary) hypertension: Secondary | ICD-10-CM | POA: Diagnosis not present

## 2019-01-14 DIAGNOSIS — Z6829 Body mass index (BMI) 29.0-29.9, adult: Secondary | ICD-10-CM | POA: Diagnosis not present

## 2019-01-14 DIAGNOSIS — I252 Old myocardial infarction: Secondary | ICD-10-CM | POA: Diagnosis not present

## 2019-01-14 DIAGNOSIS — K219 Gastro-esophageal reflux disease without esophagitis: Secondary | ICD-10-CM | POA: Diagnosis not present

## 2019-01-14 DIAGNOSIS — Z79899 Other long term (current) drug therapy: Secondary | ICD-10-CM | POA: Diagnosis not present

## 2019-01-15 ENCOUNTER — Other Ambulatory Visit: Payer: Self-pay | Admitting: Cardiology

## 2019-01-15 DIAGNOSIS — I25110 Atherosclerotic heart disease of native coronary artery with unstable angina pectoris: Secondary | ICD-10-CM

## 2019-01-29 DIAGNOSIS — R6889 Other general symptoms and signs: Secondary | ICD-10-CM | POA: Diagnosis not present

## 2019-01-29 DIAGNOSIS — Z20822 Contact with and (suspected) exposure to covid-19: Secondary | ICD-10-CM | POA: Diagnosis not present

## 2019-02-10 DIAGNOSIS — Z9181 History of falling: Secondary | ICD-10-CM | POA: Diagnosis not present

## 2019-02-10 DIAGNOSIS — Z0000 Encounter for general adult medical examination without abnormal findings: Secondary | ICD-10-CM | POA: Diagnosis not present

## 2019-02-10 DIAGNOSIS — E785 Hyperlipidemia, unspecified: Secondary | ICD-10-CM | POA: Diagnosis not present

## 2019-02-10 DIAGNOSIS — Z125 Encounter for screening for malignant neoplasm of prostate: Secondary | ICD-10-CM | POA: Diagnosis not present

## 2019-02-10 DIAGNOSIS — Z1331 Encounter for screening for depression: Secondary | ICD-10-CM | POA: Diagnosis not present

## 2019-02-16 ENCOUNTER — Telehealth: Payer: PPO | Admitting: Gastroenterology

## 2019-03-23 ENCOUNTER — Telehealth: Payer: PPO | Admitting: Gastroenterology

## 2019-04-15 ENCOUNTER — Telehealth: Payer: Self-pay

## 2019-04-15 ENCOUNTER — Telehealth (INDEPENDENT_AMBULATORY_CARE_PROVIDER_SITE_OTHER): Payer: PPO | Admitting: Gastroenterology

## 2019-04-15 ENCOUNTER — Encounter: Payer: Self-pay | Admitting: Gastroenterology

## 2019-04-15 ENCOUNTER — Other Ambulatory Visit: Payer: Self-pay

## 2019-04-15 VITALS — Ht 75.5 in | Wt 210.0 lb

## 2019-04-15 DIAGNOSIS — I25110 Atherosclerotic heart disease of native coronary artery with unstable angina pectoris: Secondary | ICD-10-CM

## 2019-04-15 DIAGNOSIS — Z86010 Personal history of colonic polyps: Secondary | ICD-10-CM | POA: Diagnosis not present

## 2019-04-15 NOTE — Addendum Note (Signed)
Addended by: Karena Addison on: 04/15/2019 12:56 PM   Modules accepted: Orders

## 2019-04-15 NOTE — Patient Instructions (Signed)
If you are age 78 or older, your body mass index should be between 23-30. Your Body mass index is 25.9 kg/m. If this is out of the aforementioned range listed, please consider follow up with your Primary Care Provider.  If you are age 5 or younger, your body mass index should be between 19-25. Your Body mass index is 25.9 kg/m. If this is out of the aformentioned range listed, please consider follow up with your Primary Care Provider.   You have been scheduled for a colonoscopy. Please follow written instructions given to you at your visit today.  Please pick up your prep supplies at the pharmacy within the next 1-3 days. If you use inhalers (even only as needed), please bring them with you on the day of your procedure. Your physician has requested that you go to www.startemmi.com and enter the access code given to you at your visit today. This web site gives a general overview about your procedure. However, you should still follow specific instructions given to you by our office regarding your preparation for the procedure.  You will be contacted by our office prior to your procedure for directions on holding your Plavix.  If you do not hear from our office 1 week prior to your scheduled procedure, please call [phone] to discuss.   Thank you,  Dr. Jackquline Denmark

## 2019-04-15 NOTE — Progress Notes (Signed)
Chief Complaint:   Referring Provider:  Helen Hashimoto., MD      ASSESSMENT AND PLAN;   #1. H/O advanced colonic polyps   #2.  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  Plan: -Colonoscopy with MiraLAX preparation after cardiology clearance from Dr. Bettina Gavia.  I discussed risks and benefits. -Hold Plavix 5 days before.  Can continue baby aspirin throughout.  Higher than baseline risk due to anticoagulation. HPI:    [name] [name] is a 79 y.o. male   No nausea, vomiting, heartburn, regurgitation, odynophagia or dysphagia.  No significant diarrhea or constipation.  No melena or hematochezia. No unintentional weight loss. No abdominal pain.  For colonoscopy due to advanced colonic polyps.  Had Covid in January 2021.  Had a rough time.  He also had 2 shots, finished early March.  He is on Plavix and aspirin d/t coronary stenting few years ago.  Being followed by Dr. Bettina Gavia.  No chest pains.  Past GI procedures: -Colonoscopy 9/2015Colon 06/2013 ([facility]): Large pedunculated rectal polyp s/p polypectomy. Bx- TA with HGD. FS 09/2013: neg Past Medical History:  Diagnosis Date  . Arthritis    "left pinky" (03/04/2017)  . Cerebral vascular malformation    PT'S NEUROLOGIST IS DR. 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 Coronary artery disease    a. s/p stent to Cx 2009. b. recent NSTEMI s/p DES to mLCx and m/dRCA.  . Essential hypertension 03/06/2017  . Headache(784.0)    POSSIBLE TRIGEMINAL NEURALGIA   . History of MI (myocardial infarction)   . Hyperlipidemia   . Hypertension   . Mass of right kidney    FOUND ON WORK UP FOR ABD PAIN  . Murmur   . Myocardial infarction ([place]) 04-14-07  . NSTEMI (non-ST elevated myocardial infarction) ([place]) 03/04/2017   [name] 03/04/2017  . Prostate cancer ([place])   . PVC's (premature ventricular  contractions)   . Umbilical hernia     Past Surgical History:  Procedure Laterality Date  . CARDIAC CATHETERIZATION  03/04/2017  . CHOLECYSTECTOMY N/A 09/19/2012   Procedure: ROBOTIC ASSISTED LAPAROSCOPIC CHOLECYSTECTOMY WITH UMBILICAL HERNIA REPAIR;  Surgeon: Leighton Ruff, MD;  Location: [facility];  Service: General;  Laterality: N/A;  . COLONOSCOPY  07/08/2013   Significant rectal polyp status post polypectomy. Mild sigmoid diverticulosis  . CORONARY ANGIOPLASTY WITH STENT PLACEMENT  04/14/07  . CORONARY STENT INTERVENTION N/A 03/05/2017   Procedure: CORONARY STENT INTERVENTION;  Surgeon: Martinique, Peter M, MD;  Location: [facility];  Service: Cardiovascular;  Laterality: N/A;  . FLEXIBLE SIGMOIDOSCOPY  09/30/2013   Rectal scar at the area of previous polypectomy- no evidence of recurrance/residual polyp. Mild sigmoid diverticulosis  . HERNIA REPAIR  05/30/2010  . JOINT REPLACEMENT    . LEFT HEART CATH AND CORONARY ANGIOGRAPHY N/A 03/04/2017   Procedure: LEFT HEART CATH AND CORONARY ANGIOGRAPHY;  Surgeon: Belva Crome, MD;  Location: [facility];  Service: Cardiovascular;  Laterality: N/A;  . LEFT HEART CATH AND CORONARY ANGIOGRAPHY N/A 03/25/2017   Procedure: LEFT HEART CATH AND CORONARY ANGIOGRAPHY;  Surgeon: Lorretta Harp, MD;  Location: [facility];  Service: Cardiovascular;  Laterality: N/A;  . PROSTATE SURGERY    . RADIOACTIVE SEED IMPLANT FOR PROSTATE CANCER  2 22 2006  . REPLACEMENT TOTAL KNEE Left 05/30/2010  . ROBOTIC ASSITED PARTIAL NEPHRECTOMY  Right 09/19/2012   Procedure: ROBOTIC ASSITED PARTIAL NEPHRECTOMY;  Surgeon: Molli Hazard, MD;  [facility];  Service: Urology;  Laterality: Right;  . SHOULDER OPEN ROTATOR CUFF REPAIR Left 03/30/2008  . TOTAL HIP ARTHROPLASTY Right 01/2001  . TOTAL HIP REVISION  07/2008   acetabular revision  . ULTRASOUND GUIDANCE FOR VASCULAR ACCESS  03/05/2017   Procedure: Ultrasound Guidance For Vascular Access;   Surgeon: Martinique, Peter M, MD;  Location: [facility];  Service: Cardiovascular;;    Family History  Problem Relation Age of Onset  . Heart attack Mother   . Lung cancer Sister   . Colon cancer Neg Hx   . Esophageal cancer Neg Hx     Social History   Tobacco Use  . Smoking status: Former Smoker    Packs/day: 1.00    Years: 30.00    Pack years: 30.00    Types: Cigarettes  . Smokeless tobacco: Never Used  . Tobacco comment: "stopped ~ 1980"  Substance Use Topics  . Alcohol use: No  . Drug use: No    Current Outpatient Medications  Medication Sig Dispense Refill  . amLODipine (NORVASC) 10 MG tablet Take 1 tablet (10 mg total) by mouth daily. 90 tablet 1  . aspirin 81 MG tablet Take 1 tablet (81 mg total) by mouth daily. 30 tablet   . atorvastatin (LIPITOR) 80 MG tablet Take 1 tablet (80 mg total) by mouth daily at 6 PM. 90 tablet 3  . Carboxymethylcellul-Glycerin (LUBRICATING EYE DROPS OP) Place 1 drop into both eyes daily as needed (dry eyes).    . clopidogrel (PLAVIX) 75 MG tablet Take 75 mg by mouth daily.    . metoprolol tartrate (LOPRESSOR) 25 MG tablet Take 1 tablet (25 mg total) by mouth 2 (two) times daily. 180 tablet 3  . pantoprazole (PROTONIX) 40 MG tablet TAKE 1 TABLET BY MOUTH DAILY AT 6:00 AM 90 tablet 0  . nitroGLYCERIN (NITROSTAT) 0.4 MG SL tablet Place 1 tablet (0.4 mg total) under the tongue every 5 (five) minutes as needed for chest pain. (Patient not taking: Reported on 04/15/2019) 25 tablet 11   No current facility-administered medications for this visit.    Allergies  Allergen Reactions  . Ativan [Lorazepam] Other (See Comments)    "Sees things."    Review of Systems:  Constitutional: Denies fever, chills, diaphoresis, appetite change and fatigue.  HEENT: Denies photophobia, eye pain, redness, hearing loss, ear pain, congestion, sore throat, rhinorrhea, sneezing, mouth sores, neck pain, neck stiffness and tinnitus.   Respiratory: Denies SOB,  DOE, cough, chest tightness,  and wheezing.   Cardiovascular: Denies chest pain, palpitations and leg swelling.  Genitourinary: Denies dysuria, urgency, frequency, hematuria, flank pain and difficulty urinating.  Musculoskeletal: Denies myalgias, back pain, joint swelling, arthralgias and gait problem.  Skin: No rash.  Neurological: Denies dizziness, seizures, syncope, weakness, light-headedness, numbness and headaches.  Hematological: Denies adenopathy. Easy bruising, personal or family bleeding history  Psychiatric/Behavioral: No anxiety or depression     Physical Exam:    Ht 6' 3.5" (1.918 m)   Wt 210 lb (95.3 kg)   BMI 25.90 kg/m  Wt Readings from Last 3 Encounters:  04/15/19 210 lb (95.3 kg)  09/10/18 210 lb (95.3 kg)  01/08/18 216 lb 6.4 oz (98.2 kg)  Televisit  Data Reviewed: I have personally reviewed following labs and imaging studies  CBC: CBC Latest Ref Rng & Units 03/26/2017 03/25/2017 03/24/2017  WBC 4.0 - 10.5 K/uL 6.4 5.9  6.9  Hemoglobin 13.0 - 17.0 g/dL 13.7 13.3 14.0  Hematocrit 39.0 - 52.0 % 40.1 39.6 40.7  Platelets 150 - 400 K/uL 154 147(L) 158    CMP: CMP Latest Ref Rng & Units 03/26/2017 03/23/2017 03/15/2017  Glucose 65 - 99 mg/dL 89 90 83  BUN 6 - 20 mg/dL 14 15 14   Creatinine 0.61 - 1.24 mg/dL 0.97 0.95 1.07  Sodium 135 - 145 mmol/L 139 138 142  Potassium 3.5 - 5.1 mmol/L 4.0 3.8 4.2  Chloride 101 - 111 mmol/L 108 106 103  CO2 22 - 32 mmol/L 23 23 24   Calcium 8.9 - 10.3 mg/dL 8.9 8.8(L) 9.4  Total Protein 6.0 - 8.5 g/dL - - 7.0  Total Bilirubin 0.0 - 1.2 mg/dL - - 0.7  Alkaline Phos 39 - 117 IU/L - - 74  AST 0 - 40 IU/L - - 23  ALT 0 - 44 IU/L - - 27  I connected with  Cl[place]e Blas on 04/15/19 by a video enabled telemedicine application and verified that I am speaking with the correct person using two identifiers.   I discussed the limitations of evaluation and management by telemedicine. The patient expressed understanding and agreed to  proceed.     Carmell Austria, MD 04/15/2019, 10:44 AM  Cc: Helen Hashimoto., MD

## 2019-04-15 NOTE — Telephone Encounter (Signed)
He can hold his Plavix for 5 days prior generator start 2 days after colonoscopy

## 2019-04-15 NOTE — Telephone Encounter (Signed)
   Primary Cardiologist: Shirlee More, MD  Chart reviewed as part of pre-operative protocol coverage. Given past medical history and time since last visit, based on ACC/AHA guidelines, [name] [name] would be at acceptable risk for the planned procedure without further cardiovascular testing.   He has an RCRI class III risk, 6.6% risk of major cardiac event.  He may hold his Plavix for 5 days prior to his procedure and restart 2 days after the EGD/colonoscopy.  I will route this recommendation to the requesting party via Epic fax function and remove from pre-op pool.  Please call with questions.  Jossie Ng. [facility] [place] [address] Office [phone] Fax [phone]

## 2019-04-15 NOTE — Telephone Encounter (Signed)
[facility] Pre-operative Risk Assessment     Request for surgical clearance:     Endoscopy Procedure  What type of surgery is being performed?     EGD/Colon  When is this surgery scheduled?     05/25/19  What type of clearance is required ?   Pharmacy  Are there any medications that need to be held prior to surgery and how long? Plavix x 5 days   Practice name and name of physician performing surgery?      [facility]  What is your office phone and fax number?      Phone- [phone]  [phone]  Anesthesia type (None, local, MAC, general) ?       MAC

## 2019-04-21 ENCOUNTER — Other Ambulatory Visit: Payer: Self-pay

## 2019-04-21 NOTE — Progress Notes (Signed)
Cardiology Office Note:    Date:  04/23/2019   ID:  [name] [name], DOB [birth year], MRN [id number]  PCP:  Helen Hashimoto., MD  Cardiologist:  Shirlee More, MD    Referring MD: Helen Hashimoto., MD    ASSESSMENT:    1. Coronary artery disease involving native coronary artery of native heart with unstable angina pectoris ([place])   2. Pure hypercholesterolemia   3. Essential hypertension    PLAN:    In order of problems listed above:  1. Stable CAD New York Heart Association class I continue long-term dual antiplatelet therapy blocker high intensity statin.  This time does not require an ischemia evaluation 2. Stable continue statin lipids are at target 3. Stable blood pressure at target continue current treatment and avoid more vigorous antihypertensive agents with previous orthostatic hypotension and syncope   Next appointment: 6 months   Medication Adjustments/Labs and Tests Ordered: Current medicines are reviewed at length with the patient today.  Concerns regarding medicines are outlined above.  Orders Placed This Encounter  Procedures  . EKG 12-Lead   No orders of the defined types were placed in this encounter.   Chief Complaint  Patient presents with  . Follow-up  . Coronary Artery Disease  . Hyperlipidemia  . Hypertension    History of Present Illness:    [name] [name] is a 79 y.o. male with a hx of CAD, Dyslipidemia, HTN, S/P PCI of LCF 04/15/07 for STEMI , symptomatic hypotension and on 03/04/17 with non STEMI with PCI and stent of mid LCF and OM1  and  presented 03/22/2017 with acute coronary syndrome with patent stent  last seen 09/10/2018. Compliance with diet, lifestyle and medications: Yes  He had mild COVID-19 not admitted to the hospital recovered in a few weeks.  Works several days a week and has had no exercise intolerance chest pain shortness of breath edema palpitation or syncope.  He tolerates dual antiplatelet therapy without GI upset or  bleeding into statin without muscle pain or weakness. Past Medical History:  Diagnosis Date  . Acute myocardial infarction, subendocardial infarction, episode of care unspecified   . Arthritis    "left pinky" (03/04/2017)  . Cerebral vascular malformation    PT'S NEUROLOGIST IS DR. 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 Coronary artery disease    a. s/p stent to Cx 2009. b. recent NSTEMI s/p DES to mLCx and m/dRCA.  Marland Kitchen Coronary artery disease involving native coronary artery of native heart with unstable angina pectoris ([place])   . Essential hypertension 03/06/2017  . Gallstones 08/07/2012  . Headache(784.0)    POSSIBLE TRIGEMINAL NEURALGIA   . History of MI (myocardial infarction)   . Hyperlipidemia   . Hypertension   . Mass of right kidney    FOUND ON WORK UP FOR ABD PAIN  . Murmur   . Myocardial infarction ([place]) 04-14-07  . NSTEMI (non-ST elevated myocardial infarction) ([place]) 03/04/2017   [name] 03/04/2017  . Prostate cancer ([place])   . PVC's (premature ventricular contractions)   . PVCs (premature ventricular contractions) 03/15/2017  . Umbilical hernia   . Unstable angina ([place]) 03/22/2017    Past Surgical History:  Procedure Laterality Date  . CARDIAC CATHETERIZATION  03/04/2017  . CHOLECYSTECTOMY N/A 09/19/2012   Procedure: ROBOTIC ASSISTED LAPAROSCOPIC CHOLECYSTECTOMY WITH UMBILICAL HERNIA REPAIR;  Surgeon: Leighton Ruff, MD;  [facility];  Service: General;  Laterality: N/A;  .  COLONOSCOPY  07/08/2013   Significant rectal polyp status post polypectomy. Mild sigmoid diverticulosis  . CORONARY ANGIOPLASTY WITH STENT PLACEMENT  04/14/07  . CORONARY STENT INTERVENTION N/A 03/05/2017   Procedure: CORONARY STENT INTERVENTION;  Surgeon: Martinique, Peter M, MD;  Location: [place] [facility];  Service: Cardiovascular;  Laterality: N/A;  . FLEXIBLE SIGMOIDOSCOPY  09/30/2013   Rectal scar at the area of previous  polypectomy- no evidence of recurrance/residual polyp. Mild sigmoid diverticulosis  . HERNIA REPAIR  05/30/2010  . JOINT REPLACEMENT    . LEFT HEART CATH AND CORONARY ANGIOGRAPHY N/A 03/04/2017   Procedure: LEFT HEART CATH AND CORONARY ANGIOGRAPHY;  Surgeon: Belva Crome, MD;  Location: [place] [facility];  Service: Cardiovascular;  Laterality: N/A;  . LEFT HEART CATH AND CORONARY ANGIOGRAPHY N/A 03/25/2017   Procedure: LEFT HEART CATH AND CORONARY ANGIOGRAPHY;  Surgeon: Lorretta Harp, MD;  Location: [place] [facility];  Service: Cardiovascular;  Laterality: N/A;  . PROSTATE SURGERY    . RADIOACTIVE SEED IMPLANT FOR PROSTATE CANCER  2 22 2006  . REPLACEMENT TOTAL KNEE Left 05/30/2010  . ROBOTIC ASSITED PARTIAL NEPHRECTOMY Right 09/19/2012   Procedure: ROBOTIC ASSITED PARTIAL NEPHRECTOMY;  Surgeon: Molli Hazard, MD;  [facility];  Service: Urology;  Laterality: Right;  . SHOULDER OPEN ROTATOR CUFF REPAIR Left 03/30/2008  . TOTAL HIP ARTHROPLASTY Right 01/2001  . TOTAL HIP REVISION  07/2008   acetabular revision  . ULTRASOUND GUIDANCE FOR VASCULAR ACCESS  03/05/2017   Procedure: Ultrasound Guidance For Vascular Access;  Surgeon: Martinique, Peter M, MD;  Location: [facility] [facility];  Service: Cardiovascular;;    Current Medications: Current Meds  Medication Sig  . aspirin 81 MG tablet Take 1 tablet (81 mg total) by mouth daily.  Marland Kitchen atorvastatin (LIPITOR) 80 MG tablet Take 1 tablet (80 mg total) by mouth daily at 6 PM.  . Carboxymethylcellul-Glycerin (LUBRICATING EYE DROPS OP) Place 1 drop into both eyes daily as needed (dry eyes).  . clopidogrel (PLAVIX) 75 MG tablet Take 75 mg by mouth daily.  . metoprolol tartrate (LOPRESSOR) 25 MG tablet Take 1 tablet (25 mg total) by mouth 2 (two) times daily.  . nitroGLYCERIN (NITROSTAT) 0.4 MG SL tablet Place 1 tablet (0.4 mg total) under the tongue every 5 (five) minutes as needed for chest pain.  . pantoprazole (PROTONIX) 40 MG tablet  TAKE 1 TABLET BY MOUTH DAILY AT 6:00 AM     Allergies:   Ativan [lorazepam]   Social History   Socioeconomic History  . Marital status: Married    Spouse name: Not on file  . Number of children: Not on file  . Years of education: Not on file  . Highest education level: Not on file  Occupational History  . Not on file  Tobacco Use  . Smoking status: Former Smoker    Packs/day: 1.00    Years: 30.00    Pack years: 30.00    Types: Cigarettes  . Smokeless tobacco: Never Used  . Tobacco comment: "stopped ~ 1980"  Substance and Sexual Activity  . Alcohol use: No  . Drug use: No  . Sexual activity: Not on file  Other Topics Concern  . Not on file  Social History Narrative  . Not on file   Social Determinants of Health   Financial Resource Strain:   . Difficulty of Paying Living Expenses:   Food Insecurity:   . Worried About Charity fundraiser in the Last Year:   .  Ran Out of Food in the Last Year:   Transportation Needs:   . Film/video editor (Medical):   Marland Kitchen Lack of Transportation (Non-Medical):   Physical Activity:   . Days of Exercise per Week:   . Minutes of Exercise per Session:   Stress:   . Feeling of Stress :   Social Connections:   . Frequency of Communication with Friends and Family:   . Frequency of Social Gatherings with Friends and Family:   . Attends Religious Services:   . Active Member of Clubs or Organizations:   . Attends Archivist Meetings:   Marland Kitchen Marital Status:      Family History: The patient's family history includes Heart attack in his mother; Lung cancer in his sister. There is no history of Colon cancer or Esophageal cancer. ROS:   Please see the history of present illness.    All other systems reviewed and are negative.  EKGs/Labs/Other Studies Reviewed:    The following studies were reviewed today:  EKG:  EKG ordered today and personally reviewed.  The ekg ordered today demonstrates sinus rhythm first-degree heart block  otherwise normal  Recent Labs: No results found for requested labs within last 8760 hours.  Recent Lipid Panel    Component Value Date/Time   CHOL 111 03/15/2017 0835   TRIG 70 03/15/2017 0835   HDL 37 (L) 03/15/2017 0835   CHOLHDL 3.0 03/15/2017 0835   CHOLHDL 4.3 03/05/2017 0350   VLDL 24 03/05/2017 0350   LDLCALC 60 03/15/2017 0835    Physical Exam:    VS:  BP (!) 144/88   Pulse 71   Temp 97.9 F (36.6 C)   Ht 6\' 3"  (1.905 m)   Wt 208 lb 9.6 oz (94.6 kg)   SpO2 96%   BMI 26.07 kg/m     Wt Readings from Last 3 Encounters:  04/22/19 208 lb 9.6 oz (94.6 kg)  04/15/19 210 lb (95.3 kg)  09/10/18 210 lb (95.3 kg)     GEN:  Well nourished, well developed in no acute distress HEENT: Normal NECK: No JVD; No carotid bruits LYMPHATICS: No lymphadenopathy CARDIAC: RRR, no murmurs, rubs, gallops RESPIRATORY:  Clear to auscultation without rales, wheezing or rhonchi  ABDOMEN: Soft, non-tender, non-distended MUSCULOSKELETAL:  No edema; No deformity  SKIN: Warm and dry NEUROLOGIC:  Alert and oriented x 3 PSYCHIATRIC:  Normal affect    Signed, Shirlee More, MD  04/23/2019 7:50 AM    [place]

## 2019-04-22 ENCOUNTER — Other Ambulatory Visit: Payer: Self-pay

## 2019-04-22 ENCOUNTER — Ambulatory Visit (INDEPENDENT_AMBULATORY_CARE_PROVIDER_SITE_OTHER): Payer: PPO | Admitting: Cardiology

## 2019-04-22 ENCOUNTER — Encounter: Payer: Self-pay | Admitting: Cardiology

## 2019-04-22 VITALS — BP 144/88 | HR 71 | Temp 97.9°F | Ht 75.0 in | Wt 208.6 lb

## 2019-04-22 DIAGNOSIS — E7800 Pure hypercholesterolemia, unspecified: Secondary | ICD-10-CM | POA: Diagnosis not present

## 2019-04-22 DIAGNOSIS — I10 Essential (primary) hypertension: Secondary | ICD-10-CM | POA: Diagnosis not present

## 2019-04-22 DIAGNOSIS — I25110 Atherosclerotic heart disease of native coronary artery with unstable angina pectoris: Secondary | ICD-10-CM | POA: Diagnosis not present

## 2019-04-22 NOTE — Patient Instructions (Signed)

## 2019-05-11 ENCOUNTER — Encounter: Payer: Self-pay | Admitting: Gastroenterology

## 2019-05-25 ENCOUNTER — Ambulatory Visit (AMBULATORY_SURGERY_CENTER): Payer: PPO | Admitting: Gastroenterology

## 2019-05-25 ENCOUNTER — Encounter: Payer: Self-pay | Admitting: Gastroenterology

## 2019-05-25 ENCOUNTER — Other Ambulatory Visit: Payer: Self-pay

## 2019-05-25 VITALS — BP 113/72 | HR 64 | Temp 96.9°F | Resp 16 | Ht 75.0 in | Wt 210.0 lb

## 2019-05-25 DIAGNOSIS — Z86010 Personal history of colonic polyps: Secondary | ICD-10-CM

## 2019-05-25 DIAGNOSIS — K635 Polyp of colon: Secondary | ICD-10-CM | POA: Diagnosis not present

## 2019-05-25 DIAGNOSIS — I10 Essential (primary) hypertension: Secondary | ICD-10-CM | POA: Diagnosis not present

## 2019-05-25 DIAGNOSIS — D123 Benign neoplasm of transverse colon: Secondary | ICD-10-CM

## 2019-05-25 DIAGNOSIS — I2510 Atherosclerotic heart disease of native coronary artery without angina pectoris: Secondary | ICD-10-CM | POA: Diagnosis not present

## 2019-05-25 DIAGNOSIS — K219 Gastro-esophageal reflux disease without esophagitis: Secondary | ICD-10-CM | POA: Diagnosis not present

## 2019-05-25 DIAGNOSIS — K5140 Inflammatory polyps of colon without complications: Secondary | ICD-10-CM | POA: Diagnosis not present

## 2019-05-25 MED ORDER — SODIUM CHLORIDE 0.9 % IV SOLN: 500.0000 mL | Freq: Once | INTRAVENOUS | Status: DC

## 2019-05-25 NOTE — Op Note (Signed)
[name] Patient Name: [name] [name] Procedure Date: 05/25/2019 9:08 AM MRN: [id number] Endoscopist: Jackquline Denmark , MD Age: 79 Referring MD:  Date of Birth: 1940-03-30 Gender: Male Account #: [id number] Procedure:                Colonoscopy Indications:              High risk colon cancer surveillance: Personal                            history of colonic polyps Medicines:                Monitored Anesthesia Care Procedure:                Pre-Anesthesia Assessment:                           - Prior to the procedure, a History and Physical                            was performed, and patient medications and                            allergies were reviewed. The patient's tolerance of                            previous anesthesia was also reviewed. The risks                            and benefits of the procedure and the sedation                            options and risks were discussed with the patient.                            All questions were answered, and informed consent                            was obtained. Prior Anticoagulants: The patient has                            taken Plavix (clopidogrel), last dose was 5 days                            prior to procedure. ASA Grade Assessment: II - A                            patient with mild systemic disease. After reviewing                            the risks and benefits, the patient was deemed in                            satisfactory condition to undergo the procedure.  After obtaining informed consent, the colonoscope                            was passed under direct vision. Throughout the                            procedure, the patient's blood pressure, pulse, and                            oxygen saturations were monitored continuously. The                            Colonoscope was introduced through the anus and                            advanced to the 2 cm into the  ileum. The                            colonoscopy was performed without difficulty. The                            patient tolerated the procedure well. The quality                            of the bowel preparation was good. The terminal                            ileum, ileocecal valve, appendiceal orifice, and                            rectum were photographed. Scope In: 9:11:59 AM Scope Out: 9:25:41 AM Scope Withdrawal Time: 0 hours 10 minutes 20 seconds  Total Procedure Duration: 0 hours 13 minutes 42 seconds  Findings:                 A 6 mm polyp was found in the proximal transverse                            colon. The polyp was sessile. The polyp was removed                            with a cold snare. Resection and retrieval were                            complete.                           A few medium-mouthed diverticula were found in the                            sigmoid colon and ascending colon.                           Non-bleeding internal hemorrhoids were found during  retroflexion. The hemorrhoids were moderate.                           The terminal ileum appeared normal.                           The exam was otherwise without abnormality on                            direct and retroflexion views. Complications:            No immediate complications. Estimated Blood Loss:     Estimated blood loss: none. Impression:               - One 6 mm polyp in the proximal transverse colon,                            removed with a cold snare. Resected and retrieved.                           - Diverticulosis in the sigmoid colon and in the                            ascending colon.                           - Non-bleeding internal hemorrhoids.                           - The examined portion of the ileum was normal.                           - The examination was otherwise normal on direct                            and retroflexion  views. Recommendation:           - Patient has a contact number available for                            emergencies. The signs and symptoms of potential                            delayed complications were discussed with the                            patient. Return to normal activities tomorrow.                            Written discharge instructions were provided to the                            patient.                           - Resume previous diet.                           -  Continue present medications.                           - Await pathology results.                           - Repeat colonoscopy for surveillance based on                            pathology results.                           - Resume Plavix (clopidogrel) at prior dose                            tomorrow.                           - The findings and recommendations were discussed                            with [name] [name]. Jackquline Denmark, MD 05/25/2019 9:31:31 AM This report has been signed electronically.

## 2019-05-25 NOTE — Progress Notes (Signed)
Temp JB V/S CW 

## 2019-05-25 NOTE — Patient Instructions (Signed)
Information on hemorrhoids, polyps, and diverticulosis given to you today.  Await pathology results.  Resume Plavix at prior dose tomorrow.    YOU HAD AN ENDOSCOPIC PROCEDURE TODAY AT [facility]:   Refer to the procedure report that was given to you for any specific questions about what was found during the examination.  If the procedure report does not answer your questions, please call your gastroenterologist to clarify.  If you requested that your care partner not be given the details of your procedure findings, then the procedure report has been included in a sealed envelope for you to review at your convenience later.  YOU SHOULD EXPECT: Some feelings of bloating in the abdomen. Passage of more gas than usual.  Walking can help get rid of the air that was put into your GI tract during the procedure and reduce the bloating. If you had a lower endoscopy (such as a colonoscopy or flexible sigmoidoscopy) you may notice spotting of blood in your stool or on the toilet paper. If you underwent a bowel prep for your procedure, you may not have a normal bowel movement for a few days.  Please Note:  You might notice some irritation and congestion in your nose or some drainage.  This is from the oxygen used during your procedure.  There is no need for concern and it should clear up in a day or so.  SYMPTOMS TO REPORT IMMEDIATELY:   Following lower endoscopy (colonoscopy or flexible sigmoidoscopy):  Excessive amounts of blood in the stool  Significant tenderness or worsening of abdominal pains  Swelling of the abdomen that is new, acute  Fever of 100F or higher    For urgent or emergent issues, a gastroenterologist can be reached at any hour by calling [phone]. Do not use MyChart messaging for urgent concerns.    DIET:  We do recommend a small meal at first, but then you may proceed to your regular diet.  Drink plenty of fluids but you should avoid alcoholic beverages  for 24 hours.  ACTIVITY:  You should plan to take it easy for the rest of today and you should NOT DRIVE or use heavy machinery until tomorrow (because of the sedation medicines used during the test).    FOLLOW UP: Our staff will call the number listed on your records 48-72 hours following your procedure to check on you and address any questions or concerns that you may have regarding the information given to you following your procedure. If we do not reach you, we will leave a message.  We will attempt to reach you two times.  During this call, we will ask if you have developed any symptoms of COVID 19. If you develop any symptoms (ie: fever, flu-like symptoms, shortness of breath, cough etc.) before then, please call [phone].  If you test positive for Covid 19 in the 2 weeks post procedure, please call and report this information to [place].    If any biopsies were taken you will be contacted by phone or by letter within the next 1-3 weeks.  Please call us at [phone] if you have not heard about the biopsies in 3 weeks.    SIGNATURES/CONFIDENTIALITY: You and/or your care partner have signed paperwork which will be entered into your electronic medical record.  These signatures attest to the fact that that the information above on your After Visit Summary has been reviewed and is understood.  Full responsibility of the confidentiality of this discharge  information lies with you and/or your care-partner.

## 2019-05-25 NOTE — Progress Notes (Signed)
Called to room to assist during endoscopic procedure.  Patient ID and intended procedure confirmed with present staff. Received instructions for my participation in the procedure from the performing physician.  

## 2019-05-25 NOTE — Progress Notes (Signed)
Report to PACU, RN, vss, BBS= Clear.  

## 2019-05-27 ENCOUNTER — Encounter: Payer: Self-pay | Admitting: Gastroenterology

## 2019-05-27 ENCOUNTER — Telehealth: Payer: Self-pay | Admitting: *Deleted

## 2019-05-27 NOTE — Telephone Encounter (Signed)
  Follow up Call-  Call back number 05/25/2019  Post procedure Call Back phone  # [phone]  Permission to leave phone message Yes  Some recent data might be hidden     Patient questions:  Do you have a fever, pain , or abdominal swelling? No. Pain Score  0 *  Have you tolerated food without any problems? Yes.    Have you been able to return to your normal activities? Yes.    Do you have any questions about your discharge instructions: Diet   no Medications  No. Follow up visit  No.  Do you have questions or concerns about your Care? No.  Actions: * If pain score is 4 or above: No action needed, pain <4.    1. Have you developed a fever since your procedure? no  2.   Have you had an respiratory symptoms (SOB or cough) since your procedure? no  3.   Have you tested positive for COVID 19 since your procedure no  4.   Have you had any family members/close contacts diagnosed with the COVID 19 since your procedure?  no   If yes to any of these questions please route to Joylene John, RN and Erenest Rasher, RN

## 2019-07-15 DIAGNOSIS — K219 Gastro-esophageal reflux disease without esophagitis: Secondary | ICD-10-CM | POA: Diagnosis not present

## 2019-07-15 DIAGNOSIS — Z6828 Body mass index (BMI) 28.0-28.9, adult: Secondary | ICD-10-CM | POA: Diagnosis not present

## 2019-07-15 DIAGNOSIS — I10 Essential (primary) hypertension: Secondary | ICD-10-CM | POA: Diagnosis not present

## 2019-07-15 DIAGNOSIS — E782 Mixed hyperlipidemia: Secondary | ICD-10-CM | POA: Diagnosis not present

## 2019-07-15 DIAGNOSIS — I2510 Atherosclerotic heart disease of native coronary artery without angina pectoris: Secondary | ICD-10-CM | POA: Diagnosis not present

## 2019-07-15 DIAGNOSIS — Z79899 Other long term (current) drug therapy: Secondary | ICD-10-CM | POA: Diagnosis not present

## 2019-07-15 DIAGNOSIS — M47812 Spondylosis without myelopathy or radiculopathy, cervical region: Secondary | ICD-10-CM | POA: Diagnosis not present

## 2019-07-15 DIAGNOSIS — I252 Old myocardial infarction: Secondary | ICD-10-CM | POA: Diagnosis not present

## 2019-11-05 DIAGNOSIS — Z20822 Contact with and (suspected) exposure to covid-19: Secondary | ICD-10-CM | POA: Diagnosis not present

## 2019-11-09 DIAGNOSIS — J069 Acute upper respiratory infection, unspecified: Secondary | ICD-10-CM | POA: Diagnosis not present

## 2019-11-09 DIAGNOSIS — Z125 Encounter for screening for malignant neoplasm of prostate: Secondary | ICD-10-CM | POA: Diagnosis not present

## 2019-11-09 DIAGNOSIS — D72829 Elevated white blood cell count, unspecified: Secondary | ICD-10-CM | POA: Diagnosis not present

## 2019-11-09 DIAGNOSIS — E782 Mixed hyperlipidemia: Secondary | ICD-10-CM | POA: Diagnosis not present

## 2019-11-09 DIAGNOSIS — R110 Nausea: Secondary | ICD-10-CM | POA: Diagnosis not present

## 2020-01-13 DIAGNOSIS — Z79899 Other long term (current) drug therapy: Secondary | ICD-10-CM | POA: Diagnosis not present

## 2020-01-13 DIAGNOSIS — Z139 Encounter for screening, unspecified: Secondary | ICD-10-CM | POA: Diagnosis not present

## 2020-01-13 DIAGNOSIS — Z6828 Body mass index (BMI) 28.0-28.9, adult: Secondary | ICD-10-CM | POA: Diagnosis not present

## 2020-01-13 DIAGNOSIS — I10 Essential (primary) hypertension: Secondary | ICD-10-CM | POA: Diagnosis not present

## 2020-01-13 DIAGNOSIS — E782 Mixed hyperlipidemia: Secondary | ICD-10-CM | POA: Diagnosis not present

## 2020-01-13 DIAGNOSIS — I2510 Atherosclerotic heart disease of native coronary artery without angina pectoris: Secondary | ICD-10-CM | POA: Diagnosis not present

## 2020-01-13 DIAGNOSIS — I252 Old myocardial infarction: Secondary | ICD-10-CM | POA: Diagnosis not present

## 2020-01-13 DIAGNOSIS — K219 Gastro-esophageal reflux disease without esophagitis: Secondary | ICD-10-CM | POA: Diagnosis not present

## 2020-02-12 DIAGNOSIS — E785 Hyperlipidemia, unspecified: Secondary | ICD-10-CM | POA: Diagnosis not present

## 2020-02-12 DIAGNOSIS — Z9181 History of falling: Secondary | ICD-10-CM | POA: Diagnosis not present

## 2020-02-12 DIAGNOSIS — Z1331 Encounter for screening for depression: Secondary | ICD-10-CM | POA: Diagnosis not present

## 2020-02-12 DIAGNOSIS — Z0000 Encounter for general adult medical examination without abnormal findings: Secondary | ICD-10-CM | POA: Diagnosis not present

## 2020-05-27 DIAGNOSIS — I10 Essential (primary) hypertension: Secondary | ICD-10-CM | POA: Insufficient documentation

## 2020-05-27 DIAGNOSIS — K429 Umbilical hernia without obstruction or gangrene: Secondary | ICD-10-CM | POA: Insufficient documentation

## 2020-05-27 DIAGNOSIS — K219 Gastro-esophageal reflux disease without esophagitis: Secondary | ICD-10-CM | POA: Insufficient documentation

## 2020-05-27 DIAGNOSIS — I493 Ventricular premature depolarization: Secondary | ICD-10-CM | POA: Insufficient documentation

## 2020-05-27 DIAGNOSIS — M1990 Unspecified osteoarthritis, unspecified site: Secondary | ICD-10-CM | POA: Insufficient documentation

## 2020-05-27 DIAGNOSIS — I2510 Atherosclerotic heart disease of native coronary artery without angina pectoris: Secondary | ICD-10-CM | POA: Insufficient documentation

## 2020-05-27 DIAGNOSIS — N2889 Other specified disorders of kidney and ureter: Secondary | ICD-10-CM | POA: Insufficient documentation

## 2020-05-27 DIAGNOSIS — R011 Cardiac murmur, unspecified: Secondary | ICD-10-CM | POA: Insufficient documentation

## 2020-05-27 DIAGNOSIS — I252 Old myocardial infarction: Secondary | ICD-10-CM | POA: Insufficient documentation

## 2020-06-17 ENCOUNTER — Other Ambulatory Visit: Payer: Self-pay

## 2020-06-17 ENCOUNTER — Ambulatory Visit: Payer: PPO | Admitting: Cardiology

## 2020-06-17 VITALS — BP 128/80 | HR 62 | Ht 75.5 in | Wt 210.6 lb

## 2020-06-17 DIAGNOSIS — I10 Essential (primary) hypertension: Secondary | ICD-10-CM | POA: Diagnosis not present

## 2020-06-17 DIAGNOSIS — I25110 Atherosclerotic heart disease of native coronary artery with unstable angina pectoris: Secondary | ICD-10-CM

## 2020-06-17 DIAGNOSIS — E7800 Pure hypercholesterolemia, unspecified: Secondary | ICD-10-CM

## 2020-06-17 NOTE — Progress Notes (Signed)
Cardiology Office Note:    Date:  06/17/2020   ID:  [name] [name], DOB 04-10-1940, MRN [id number]  PCP:  Helen Hashimoto., MD  Cardiologist:  Shirlee More, MD    Referring MD: Helen Hashimoto., MD    ASSESSMENT:    1. Coronary artery disease involving native coronary artery of native heart with unstable angina pectoris ([place])   2. Pure hypercholesterolemia   3. Essential hypertension    PLAN:    In order of problems listed above:  1. He has stable CAD following revascularization percutaneous New York Heart Association class I continue his current long-term dual antiplatelet therapy beta-blocker and lipid-lowering with a high intensity statin 2. Continue statin lipids at target he has arrangements for follow-up labs with his PCP 3. At target continue treatment including calcium channel blocker   Next appointment: 1 year or sooner if he is having anginal symptoms   Medication Adjustments/Labs and Tests Ordered: Current medicines are reviewed at length with the patient today.  Concerns regarding medicines are outlined above.  Orders Placed This Encounter  Procedures  . EKG 12-Lead   No orders of the defined types were placed in this encounter.   Chief Complaint  Patient presents with  . Follow-up  . Coronary Artery Disease    History of Present Illness:    [name] [name] is a 80 y.o. male with a hx of CAD with PCI to left circumflex 04/15/2007 and subsequent PCI and stent mid  left circumflex and OM1 February 2019 hypertension and hyper lipidemia.  He was ast seen 04/22/2019. Compliance with diet, lifestyle and medications: Yes  Most recent  labs: 07/15/2019 cholesterol 126 LDL 71 triglycerides 113 HDL 34 A1c 5.2% creatinine 1.6  He continues to do well retired but works 2 days a week He has had no chest pain shortness of breath recurrent orthostatic hypotension edema palpitation or syncope. He tolerates his statin without muscle pain or weakness. He will  continue his long-term dual antiplatelet therapy. He has had no GI symptoms of bleeding. He has arrangements for follow-up with his PCP to recheck labs in the next few months Past Medical History:  Diagnosis Date  . Acute myocardial infarction, subendocardial infarction, episode of care unspecified   . Arthritis    "left pinky" (03/04/2017)  . Cerebral vascular malformation    PT'S NEUROLOGIST IS DR. 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 Coronary artery disease    a. s/p stent to Cx 2009. b. recent NSTEMI s/p DES to mLCx and m/dRCA.  Marland Kitchen Coronary artery disease involving native coronary artery of native heart with unstable angina pectoris ([place])   . Essential hypertension 03/06/2017  . Gallstones 08/07/2012  . GERD (gastroesophageal reflux disease)   . Headache(784.0)    POSSIBLE TRIGEMINAL NEURALGIA   . History of MI (myocardial infarction)   . Hyperlipidemia   . Hypertension   . Mass of right kidney    FOUND ON WORK UP FOR ABD PAIN  . Murmur   . Myocardial infarction ([place]) 04-14-07  . NSTEMI (non-ST elevated myocardial infarction) ([place]) 03/04/2017   [name] 03/04/2017  . Prostate cancer ([place])   . PVC's (premature ventricular contractions)   . PVCs (premature ventricular contractions) 03/15/2017  . Umbilical hernia   . Unstable angina ([place]) 03/22/2017    Past Surgical History:  Procedure Laterality Date  . CARDIAC CATHETERIZATION  03/04/2017  . CHOLECYSTECTOMY N/A 09/19/2012  Procedure: ROBOTIC ASSISTED LAPAROSCOPIC CHOLECYSTECTOMY WITH UMBILICAL HERNIA REPAIR;  Surgeon: Leighton Ruff, MD;  Location: [facility];  Service: General;  Laterality: N/A;  . COLONOSCOPY  07/08/2013   Significant rectal polyp status post polypectomy. Mild sigmoid diverticulosis  . CORONARY ANGIOPLASTY WITH STENT PLACEMENT  04/14/07  . CORONARY STENT INTERVENTION N/A 03/05/2017   Procedure: CORONARY STENT INTERVENTION;  Surgeon: Martinique,  Peter M, MD;  Location: [facility];  Service: Cardiovascular;  Laterality: N/A;  . FLEXIBLE SIGMOIDOSCOPY  09/30/2013   Rectal scar at the area of previous polypectomy- no evidence of recurrance/residual polyp. Mild sigmoid diverticulosis  . HERNIA REPAIR  05/30/2010  . JOINT REPLACEMENT    . LEFT HEART CATH AND CORONARY ANGIOGRAPHY N/A 03/04/2017   Procedure: LEFT HEART CATH AND CORONARY ANGIOGRAPHY;  Surgeon: Belva Crome, MD;  Location: [facility];  Service: Cardiovascular;  Laterality: N/A;  . LEFT HEART CATH AND CORONARY ANGIOGRAPHY N/A 03/25/2017   Procedure: LEFT HEART CATH AND CORONARY ANGIOGRAPHY;  Surgeon: Lorretta Harp, MD;  Location: [facility];  Service: Cardiovascular;  Laterality: N/A;  . POLYPECTOMY    . PROSTATE SURGERY    . RADIOACTIVE SEED IMPLANT FOR PROSTATE CANCER  2 22 2006  . REPLACEMENT TOTAL KNEE Left 05/30/2010  . ROBOTIC ASSITED PARTIAL NEPHRECTOMY Right 09/19/2012   Procedure: ROBOTIC ASSITED PARTIAL NEPHRECTOMY;  Surgeon: Molli Hazard, MD;  Location: [facility];  Service: Urology;  Laterality: Right;  . SHOULDER OPEN ROTATOR CUFF REPAIR Left 03/30/2008  . TOTAL HIP ARTHROPLASTY Right 01/2001  . TOTAL HIP REVISION  07/2008   acetabular revision  . ULTRASOUND GUIDANCE FOR VASCULAR ACCESS  03/05/2017   Procedure: Ultrasound Guidance For Vascular Access;  Surgeon: Martinique, Peter M, MD;  Location: [facility];  Service: Cardiovascular;;    Current Medications: Current Meds  Medication Sig  . amLODipine (NORVASC) 5 MG tablet Take 1 tablet by mouth every morning.  Marland Kitchen aspirin 81 MG tablet Take 1 tablet (81 mg total) by mouth daily.  Marland Kitchen atorvastatin (LIPITOR) 80 MG tablet Take 1 tablet (80 mg total) by mouth daily at 6 PM.  . Carboxymethylcellul-Glycerin (LUBRICATING EYE DROPS OP) Place 1 drop into both eyes daily as needed (dry eyes).  . clopidogrel (PLAVIX) 75 MG tablet Take 75 mg by mouth daily.  . metoprolol tartrate  (LOPRESSOR) 25 MG tablet Take 1 tablet (25 mg total) by mouth 2 (two) times daily.  . nitroGLYCERIN (NITROSTAT) 0.4 MG SL tablet Place 1 tablet (0.4 mg total) under the tongue every 5 (five) minutes as needed for chest pain.  . pantoprazole (PROTONIX) 40 MG tablet TAKE 1 TABLET BY MOUTH DAILY AT 6:00 AM     Allergies:   Ativan [lorazepam]   Social History   Socioeconomic History  . Marital status: Married    Spouse name: Not on file  . Number of children: Not on file  . Years of education: Not on file  . Highest education level: Not on file  Occupational History  . Not on file  Tobacco Use  . Smoking status: Former Smoker    Packs/day: 1.00    Years: 30.00    Pack years: 30.00    Types: Cigarettes  . Smokeless tobacco: Never Used  . Tobacco comment: "stopped ~ 1980"  Vaping Use  . Vaping Use: Never used  Substance and Sexual Activity  . Alcohol use: No  . Drug use: No  . Sexual activity: Not on file  Other Topics Concern  . Not on file  Social History Narrative  . Not on file   Social Determinants of Health   Financial Resource Strain: Not on file  Food Insecurity: Not on file  Transportation Needs: Not on file  Physical Activity: Not on file  Stress: Not on file  Social Connections: Not on file     Family History: The patient's family history includes Heart attack in his mother; Lung cancer in his sister. There is no history of Colon cancer, Esophageal cancer, Colon polyps, Rectal cancer, or Stomach cancer. ROS:   Please see the history of present illness.    All other systems reviewed and are negative.  EKGs/Labs/Other Studies Reviewed:    The following studies were reviewed today:  EKG:  EKG ordered today and personally reviewed.  The ekg ordered today demonstrates sinus rhythm normal EKG   Recent Lipid Panel    Component Value Date/Time   CHOL 111 03/15/2017 0835   TRIG 70 03/15/2017 0835   HDL 37 (L) 03/15/2017 0835   CHOLHDL 3.0 03/15/2017 0835    CHOLHDL 4.3 03/05/2017 0350   VLDL 24 03/05/2017 0350   LDLCALC 60 03/15/2017 0835    Physical Exam:    VS:  BP 128/80   Pulse 62   Ht 6' 3.5" (1.918 m)   Wt 210 lb 9.6 oz (95.5 kg)   SpO2 98%   BMI 25.98 kg/m     Wt Readings from Last 3 Encounters:  06/17/20 210 lb 9.6 oz (95.5 kg)  05/25/19 210 lb (95.3 kg)  04/22/19 208 lb 9.6 oz (94.6 kg)     GEN:  Well nourished, well developed in no acute distress HEENT: Normal NECK: No JVD; No carotid bruits LYMPHATICS: No lymphadenopathy CARDIAC: RRR, no murmurs, rubs, gallops RESPIRATORY:  Clear to auscultation without rales, wheezing or rhonchi  ABDOMEN: Soft, non-tender, non-distended MUSCULOSKELETAL:  No edema; No deformity  SKIN: Warm and dry NEUROLOGIC:  Alert and oriented x 3 PSYCHIATRIC:  Normal affect    Signed, Shirlee More, MD  06/17/2020 8:37 AM    [facility]

## 2020-06-17 NOTE — Patient Instructions (Signed)

## 2020-07-18 DIAGNOSIS — E782 Mixed hyperlipidemia: Secondary | ICD-10-CM | POA: Diagnosis not present

## 2020-07-18 DIAGNOSIS — K219 Gastro-esophageal reflux disease without esophagitis: Secondary | ICD-10-CM | POA: Diagnosis not present

## 2020-07-18 DIAGNOSIS — I252 Old myocardial infarction: Secondary | ICD-10-CM | POA: Diagnosis not present

## 2020-07-18 DIAGNOSIS — Z6828 Body mass index (BMI) 28.0-28.9, adult: Secondary | ICD-10-CM | POA: Diagnosis not present

## 2020-07-18 DIAGNOSIS — I10 Essential (primary) hypertension: Secondary | ICD-10-CM | POA: Diagnosis not present

## 2020-07-18 DIAGNOSIS — I2510 Atherosclerotic heart disease of native coronary artery without angina pectoris: Secondary | ICD-10-CM | POA: Diagnosis not present

## 2020-07-18 DIAGNOSIS — Z79899 Other long term (current) drug therapy: Secondary | ICD-10-CM | POA: Diagnosis not present

## 2020-11-07 DIAGNOSIS — I10 Essential (primary) hypertension: Secondary | ICD-10-CM | POA: Diagnosis not present

## 2020-11-07 DIAGNOSIS — J4520 Mild intermittent asthma, uncomplicated: Secondary | ICD-10-CM | POA: Diagnosis not present

## 2020-11-07 DIAGNOSIS — Z79899 Other long term (current) drug therapy: Secondary | ICD-10-CM | POA: Diagnosis not present

## 2020-11-07 DIAGNOSIS — J069 Acute upper respiratory infection, unspecified: Secondary | ICD-10-CM | POA: Diagnosis not present

## 2020-12-13 NOTE — Progress Notes (Signed)
Cardiology Office Note:    Date:  12/14/2020   ID:  [name] [name], DOB 1940-11-13, MRN [id number]  PCP:  [name] Hashimoto., [name]  Cardiologist:  [name] More, [name]    Referring [name]: [name] Hashimoto., [name]    ASSESSMENT:    1. Coronary artery disease involving native coronary artery of native heart with unstable angina pectoris ([place])   2. Pure hypercholesterolemia   3. Essential hypertension   4. PVC's (premature ventricular contractions)    PLAN:    In order of problems listed above:  [name] [name] continues to do well with CAD after multiple PCI's and current medical treatment having no angina New York Heart Association class I.  He will continue treatment including low-dose aspirin lifelong that is not uncommon to have minor bruising along with his high intensity statin clopidogrel and metoprolol.  I think he should remain on dual Antiplatelet therapy with early stent occlusion.  At this time I do not think he requires an ischemic evaluation Recheck lipids may require coincident treatment in setting of PCSK9 inhibitor if he has severe residual LDL elevation Stable BP at target continue beta-blocker Stable having no symptoms continue beta-blocker   Next appointment: 9 months   Medication Adjustments/Labs and Tests Ordered: Current medicines are reviewed at length with the patient today.  Concerns regarding medicines are outlined above.  Orders Placed This Encounter  Procedures   Comprehensive metabolic panel   Lipid panel   No orders of the defined types were placed in this encounter.   Chief Complaint  Patient presents with   Follow-up   Coronary Artery Disease   Hypertension   Hyperlipidemia   History of Present Illness:    [name] [name] is a 80 y.o. male with a hx of CAD with PCI to left circumflex 04/15/2007 and subsequent PCI and stent mid  left circumflex and OM1 February 2019 hypertension symptomatic orthostatic hypotension and hyperlipidemia  last seen  06/17/2020.  Compliance with diet, lifestyle and medications: Yes  [name] [name] continues to enjoy life is 80 years old and still works part-time He is pleased with the quality of his life and has no angina edema shortness of breath palpitation or syncope. Most recent labs 07/18/2020 showed cholesterol 191 LDL 130 triglycerides 121 HDL 39 A1c 5.2% hemoglobin 15.5 creatinine 1.1 potassium 4.1.  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 s/p stent to Cx 2009. b. recent NSTEMI s/p DES to mLCx and [name]/dRCA.   Coronary artery disease involving native coronary artery of native heart with unstable angina pectoris ([place])    Essential hypertension 03/06/2017   Gallstones 08/07/2012   GERD (gastroesophageal reflux disease)    Headache(784.0)    POSSIBLE TRIGEMINAL NEURALGIA    History of MI (myocardial infarction)    Hyperlipidemia    Hypertension    Mass of right kidney    FOUND ON WORK UP FOR ABD PAIN   Murmur    Myocardial  infarction [facility]) 04-14-07   NSTEMI (non-ST elevated myocardial infarction) ([place]) 03/04/2017   [name] [name] 03/04/2017   Prostate cancer ([place])    PVC's (premature ventricular contractions)    PVCs (premature ventricular contractions) 9/67/5916   Umbilical hernia    Unstable angina ([place]) 03/22/2017    Past Surgical History:  Procedure Laterality Date   CARDIAC CATHETERIZATION  03/04/2017   CHOLECYSTECTOMY N/A 09/19/2012   Procedure: ROBOTIC ASSISTED LAPAROSCOPIC  CHOLECYSTECTOMY WITH UMBILICAL HERNIA REPAIR;  Surgeon: [name] Ruff, [name];  Location: [facility];  Service: General;  Laterality: N/A;   COLONOSCOPY  07/08/2013   Significant rectal polyp status post polypectomy. Mild sigmoid diverticulosis   CORONARY ANGIOPLASTY WITH STENT PLACEMENT  04/14/07   CORONARY STENT INTERVENTION N/A 03/05/2017   Procedure: CORONARY STENT INTERVENTION;  Surgeon: [name], [name] [name];  Location: [facility];  Service: Cardiovascular;  Laterality: N/A;   FLEXIBLE SIGMOIDOSCOPY  09/30/2013   Rectal scar at the area of previous polypectomy- no evidence of recurrance/residual polyp. Mild sigmoid diverticulosis   HERNIA REPAIR  05/30/2010   JOINT REPLACEMENT     LEFT HEART CATH AND CORONARY ANGIOGRAPHY N/A 03/04/2017   Procedure: LEFT HEART CATH AND CORONARY ANGIOGRAPHY;  Surgeon: [name] Crome, [name];  Location: [facility];  Service: Cardiovascular;  Laterality: N/A;   LEFT HEART CATH AND CORONARY ANGIOGRAPHY N/A 03/25/2017   Procedure: LEFT HEART CATH AND CORONARY ANGIOGRAPHY;  Surgeon: [name] Harp, [name];  Location: [facility];  Service: Cardiovascular;  Laterality: N/A;   POLYPECTOMY     PROSTATE SURGERY     RADIOACTIVE SEED IMPLANT FOR PROSTATE CANCER  2 22 2006   REPLACEMENT TOTAL KNEE Left 05/30/2010   ROBOTIC ASSITED PARTIAL NEPHRECTOMY Right 09/19/2012   Procedure: ROBOTIC ASSITED PARTIAL NEPHRECTOMY;  Surgeon: [name] Hazard, [name];  [facility]: [facility];  Service: Urology;  Laterality: Right;   SHOULDER OPEN ROTATOR CUFF REPAIR Left 03/30/2008   TOTAL HIP ARTHROPLASTY Right 01/2001   TOTAL HIP REVISION  07/2008   acetabular revision   ULTRASOUND GUIDANCE FOR VASCULAR ACCESS  03/05/2017   Procedure: Ultrasound Guidance For Vascular Access;  Surgeon: [name], [name] [name];  Location: [facility];  Service: Cardiovascular;;    Current Medications: Current Meds  Medication Sig   amLODipine (NORVASC) 5 MG tablet Take 1 tablet by mouth every  morning.   aspirin 81 MG tablet Take 1 tablet (81 mg total) by mouth daily.   atorvastatin (LIPITOR) 80 MG tablet Take 1 tablet (80 mg total) by mouth daily at 6 PM.   clopidogrel (PLAVIX) 75 MG tablet Take 75 mg by mouth daily.   metoprolol tartrate (LOPRESSOR) 25 MG tablet Take 1 tablet (25 mg total) by mouth 2 (two) times daily.   nitroGLYCERIN (NITROSTAT) 0.4 MG SL tablet Place 1 tablet (0.4 mg total) under the tongue every 5 (five) minutes as needed for chest pain.   pantoprazole (PROTONIX) 40 MG tablet TAKE 1 TABLET BY MOUTH DAILY AT 6:00 AM     Allergies:   Ativan [lorazepam] and Lisinopril   Social History   Socioeconomic History   Marital status: Married    Spouse name: Not on file   Number of children: Not on file   Years of education: Not on file   Highest education level: Not on file  Occupational History   Not on file  Tobacco Use   Smoking status: Former    Packs/day: 1.00    Years: 30.00  Pack years: 30.00    Types: Cigarettes   Smokeless tobacco: Never   Tobacco comments:    "stopped ~ 1980"  Vaping Use   Vaping Use: Never used  Substance and Sexual Activity   Alcohol use: No   Drug use: No   Sexual activity: Not on file  Other Topics Concern   Not on file  Social History Narrative   Not on file   Social Determinants of Health   Financial Resource Strain: Not on file  Food Insecurity: Not on file  Transportation Needs: Not on file  Physical Activity: Not on file  Stress: Not on file  Social Connections: Not on file     Family History: The patient's family history includes Heart attack in his mother; Lung cancer in his sister. There is no history of Colon cancer, Esophageal cancer, Colon polyps, Rectal cancer, or Stomach cancer. ROS:   Please see the history of present illness.    All other systems reviewed and are negative.  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  Recent Lipid Panel    Component Value Date/Time   CHOL 111 03/15/2017 0835   TRIG 70 03/15/2017 0835   HDL 37 (L) 03/15/2017 0835   CHOLHDL 3.0 03/15/2017 0835   CHOLHDL 4.3 03/05/2017 0350   VLDL 24 03/05/2017 0350   LDLCALC 60 03/15/2017 0835    Physical Exam:    VS:  BP (!) 142/92   Pulse 70   Ht 6' 3.5" (1.918 [name])   Wt 208 lb (94.3 kg)   SpO2 98%   BMI 25.66 kg/[name]     Wt Readings from Last 3 Encounters:  12/14/20 208 lb (94.3 kg)  06/17/20 210 lb 9.6 oz (95.5 kg)  05/25/19 210 lb (95.3 kg)    Repeat blood pressure 138/82 GEN: Looks younger than his age well nourished, well developed in no acute distress HEENT: Normal NECK: No JVD; No carotid bruits LYMPHATICS: No lymphadenopathy CARDIAC: RRR, no murmurs, rubs, gallops RESPIRATORY:  Clear to auscultation without rales, wheezing or rhonchi  ABDOMEN: Soft, non-tender, non-distended MUSCULOSKELETAL:  No edema; No deformity  SKIN: Warm and dry NEUROLOGIC:  Alert and oriented x 3 PSYCHIATRIC:  Normal affect    Signed, [name] More, [name]  12/14/2020 11:23 AM    [place]

## 2020-12-14 ENCOUNTER — Other Ambulatory Visit: Payer: Self-pay

## 2020-12-14 ENCOUNTER — Ambulatory Visit: Payer: PPO | Admitting: Cardiology

## 2020-12-14 ENCOUNTER — Encounter: Payer: Self-pay | Admitting: Cardiology

## 2020-12-14 VITALS — BP 138/82 | HR 70 | Ht 75.5 in | Wt 208.0 lb

## 2020-12-14 DIAGNOSIS — I25110 Atherosclerotic heart disease of native coronary artery with unstable angina pectoris: Secondary | ICD-10-CM

## 2020-12-14 DIAGNOSIS — I493 Ventricular premature depolarization: Secondary | ICD-10-CM

## 2020-12-14 DIAGNOSIS — I10 Essential (primary) hypertension: Secondary | ICD-10-CM | POA: Diagnosis not present

## 2020-12-14 DIAGNOSIS — E7800 Pure hypercholesterolemia, unspecified: Secondary | ICD-10-CM

## 2020-12-14 NOTE — Patient Instructions (Signed)
Medication Instructions:  Your physician recommends that you continue on your current medications as directed. Please refer to the Current Medication list given to you today.  *If you need a refill on your cardiac medications before your next appointment, please call your pharmacy*   Lab Work: Your physician recommends that you return for lab work in: [place] If you have labs (blood work) drawn today and your tests are completely normal, you will receive your results only by: [place] (if you have MyChart) OR A paper copy in the mail If you have any lab test that is abnormal or we need to change your treatment, we will call you to review the results.   Testing/Procedures: None   Follow-Up: At [facility], you and your health needs are our priority.  As part of our continuing mission to provide you with exceptional heart care, we have created designated Provider Care Teams.  These Care Teams include your primary Cardiologist (physician) and Advanced Practice Providers (APPs -  Physician Assistants and Nurse Practitioners) who all work together to provide you with the care you need, when you need it.  We recommend signing up for the patient portal called "MyChart".  Sign up information is provided on this After Visit Summary.  MyChart is used to connect with patients for Virtual Visits (Telemedicine).  Patients are able to view lab/test results, encounter notes, upcoming appointments, etc.  Non-urgent messages can be sent to your provider as well.   To learn more about what you can do with MyChart, go to [email or web address].    Your next appointment:   9 month(s)  The format for your next appointment:   In Person  Provider:   Shirlee More, MD    Other Instructions

## 2020-12-15 LAB — COMPREHENSIVE METABOLIC PANEL
ALT: 14 IU/L (ref 0–44)
AST: 22 IU/L (ref 0–40)
Albumin/Globulin Ratio: 1.8 (ref 1.2–2.2)
Albumin: 4.6 g/dL (ref 3.7–4.7)
Alkaline Phosphatase: 65 IU/L (ref 44–121)
BUN/Creatinine Ratio: 17 (ref 10–24)
BUN: 18 mg/dL (ref 8–27)
Bilirubin Total: 0.5 mg/dL (ref 0.0–1.2)
CO2: 24 mmol/L (ref 20–29)
Calcium: 9.3 mg/dL (ref 8.6–10.2)
Chloride: 103 mmol/L (ref 96–106)
Creatinine, Ser: 1.05 mg/dL (ref 0.76–1.27)
Globulin, Total: 2.5 g/dL (ref 1.5–4.5)
Glucose: 95 mg/dL (ref 70–99)
Potassium: 4.3 mmol/L (ref 3.5–5.2)
Sodium: 141 mmol/L (ref 134–144)
Total Protein: 7.1 g/dL (ref 6.0–8.5)
eGFR: 73 mL/min/{1.73_m2} (ref >59)

## 2020-12-15 LAB — LIPID PANEL
Chol/HDL Ratio: 4.2 ratio (ref 0.0–5.0)
Cholesterol, Total: 192 mg/dL (ref 100–199)
HDL: 46 mg/dL (ref >39)
LDL Chol Calc (NIH): 129 mg/dL — ABNORMAL HIGH (ref 0–99)
Triglycerides: 96 mg/dL (ref 0–149)
VLDL Cholesterol Cal: 17 mg/dL (ref 5–40)

## 2021-01-18 DIAGNOSIS — I2510 Atherosclerotic heart disease of native coronary artery without angina pectoris: Secondary | ICD-10-CM | POA: Diagnosis not present

## 2021-01-18 DIAGNOSIS — D692 Other nonthrombocytopenic purpura: Secondary | ICD-10-CM | POA: Diagnosis not present

## 2021-01-18 DIAGNOSIS — I10 Essential (primary) hypertension: Secondary | ICD-10-CM | POA: Diagnosis not present

## 2021-01-18 DIAGNOSIS — E782 Mixed hyperlipidemia: Secondary | ICD-10-CM | POA: Diagnosis not present

## 2021-01-18 DIAGNOSIS — Z79899 Other long term (current) drug therapy: Secondary | ICD-10-CM | POA: Diagnosis not present

## 2021-01-18 DIAGNOSIS — Z139 Encounter for screening, unspecified: Secondary | ICD-10-CM | POA: Diagnosis not present

## 2021-01-18 DIAGNOSIS — I252 Old myocardial infarction: Secondary | ICD-10-CM | POA: Diagnosis not present

## 2021-01-18 DIAGNOSIS — K219 Gastro-esophageal reflux disease without esophagitis: Secondary | ICD-10-CM | POA: Diagnosis not present

## 2021-02-15 DIAGNOSIS — Z9181 History of falling: Secondary | ICD-10-CM | POA: Diagnosis not present

## 2021-02-15 DIAGNOSIS — E785 Hyperlipidemia, unspecified: Secondary | ICD-10-CM | POA: Diagnosis not present

## 2021-02-15 DIAGNOSIS — Z1331 Encounter for screening for depression: Secondary | ICD-10-CM | POA: Diagnosis not present

## 2021-02-15 DIAGNOSIS — Z0000 Encounter for general adult medical examination without abnormal findings: Secondary | ICD-10-CM | POA: Diagnosis not present

## 2021-03-10 DIAGNOSIS — R059 Cough, unspecified: Secondary | ICD-10-CM | POA: Diagnosis not present

## 2021-03-10 DIAGNOSIS — Z6828 Body mass index (BMI) 28.0-28.9, adult: Secondary | ICD-10-CM | POA: Diagnosis not present

## 2021-03-10 DIAGNOSIS — J309 Allergic rhinitis, unspecified: Secondary | ICD-10-CM | POA: Diagnosis not present

## 2021-03-10 DIAGNOSIS — Z79899 Other long term (current) drug therapy: Secondary | ICD-10-CM | POA: Diagnosis not present

## 2021-03-10 DIAGNOSIS — J22 Unspecified acute lower respiratory infection: Secondary | ICD-10-CM | POA: Diagnosis not present

## 2021-07-19 DIAGNOSIS — K219 Gastro-esophageal reflux disease without esophagitis: Secondary | ICD-10-CM | POA: Diagnosis not present

## 2021-07-19 DIAGNOSIS — I10 Essential (primary) hypertension: Secondary | ICD-10-CM | POA: Diagnosis not present

## 2021-07-19 DIAGNOSIS — Z1212 Encounter for screening for malignant neoplasm of rectum: Secondary | ICD-10-CM | POA: Diagnosis not present

## 2021-07-19 DIAGNOSIS — I2510 Atherosclerotic heart disease of native coronary artery without angina pectoris: Secondary | ICD-10-CM | POA: Diagnosis not present

## 2021-07-19 DIAGNOSIS — Z6827 Body mass index (BMI) 27.0-27.9, adult: Secondary | ICD-10-CM | POA: Diagnosis not present

## 2021-07-19 DIAGNOSIS — R110 Nausea: Secondary | ICD-10-CM | POA: Diagnosis not present

## 2021-07-19 DIAGNOSIS — E782 Mixed hyperlipidemia: Secondary | ICD-10-CM | POA: Diagnosis not present

## 2021-07-19 DIAGNOSIS — R195 Other fecal abnormalities: Secondary | ICD-10-CM | POA: Diagnosis not present

## 2021-07-19 DIAGNOSIS — I252 Old myocardial infarction: Secondary | ICD-10-CM | POA: Diagnosis not present

## 2021-09-19 DIAGNOSIS — M1711 Unilateral primary osteoarthritis, right knee: Secondary | ICD-10-CM | POA: Diagnosis not present

## 2021-09-19 DIAGNOSIS — G8929 Other chronic pain: Secondary | ICD-10-CM | POA: Diagnosis not present

## 2021-09-19 DIAGNOSIS — M25561 Pain in right knee: Secondary | ICD-10-CM | POA: Diagnosis not present

## 2021-09-19 DIAGNOSIS — Z96652 Presence of left artificial knee joint: Secondary | ICD-10-CM | POA: Diagnosis not present

## 2021-09-20 ENCOUNTER — Telehealth: Payer: Self-pay | Admitting: *Deleted

## 2021-09-20 ENCOUNTER — Telehealth: Payer: Self-pay | Admitting: Cardiology

## 2021-09-20 DIAGNOSIS — I252 Old myocardial infarction: Secondary | ICD-10-CM | POA: Diagnosis not present

## 2021-09-20 DIAGNOSIS — I10 Essential (primary) hypertension: Secondary | ICD-10-CM | POA: Diagnosis not present

## 2021-09-20 DIAGNOSIS — I2510 Atherosclerotic heart disease of native coronary artery without angina pectoris: Secondary | ICD-10-CM | POA: Diagnosis not present

## 2021-09-20 DIAGNOSIS — Z6827 Body mass index (BMI) 27.0-27.9, adult: Secondary | ICD-10-CM | POA: Diagnosis not present

## 2021-09-20 DIAGNOSIS — M25561 Pain in right knee: Secondary | ICD-10-CM | POA: Diagnosis not present

## 2021-09-20 DIAGNOSIS — E782 Mixed hyperlipidemia: Secondary | ICD-10-CM | POA: Diagnosis not present

## 2021-09-20 DIAGNOSIS — Z01818 Encounter for other preprocedural examination: Secondary | ICD-10-CM | POA: Diagnosis not present

## 2021-09-20 NOTE — Telephone Encounter (Signed)
Pt spouse calling stating that [facility] said that pt needed to schedule an appt with APP to be cleared for surgery. She states he is having knee surgery. I advised her to have the office call for clearance but she said they gave her a clearance form for him to bring to his appt.

## 2021-09-20 NOTE — Telephone Encounter (Signed)
   Name: [name] [name]  DOB: 1940-09-23  MRN: [id number]  Primary Cardiologist: Shirlee More, MD   Preoperative team, please contact this patient and set up a phone call appointment for further preoperative risk assessment. Please obtain consent and complete medication review. Thank you for your help.  I confirm that guidance regarding antiplatelet and oral anticoagulation therapy has been completed and, if necessary, noted below. -NSTEMI/PCI in 2019 - Pt should be able to hold Plavix 5 days and ASA 7 days.  [name], PA-C 09/20/2021, 5:28 PM [place]

## 2021-09-20 NOTE — Telephone Encounter (Signed)
Returned call to pt's spouse, [name], [place] on file.  She has been made aware of the "protocol" for surgical clearances with Cardiology.  She will contact the surgeon's office to have them fax a general surgical clearance request over.  She was very appreciative of the call / information given.

## 2021-09-20 NOTE — Telephone Encounter (Signed)
PT ALREADY SCHEDULED WITH ERNEST DICK, NP, 10/04/21 FOR IN-OFFICE APPOINTMENT.        Pre-operative Risk Assessment    Patient Name: [name] [name]  DOB: [birth year] MRN: [id number]      Request for Surgical Clearance    Procedure:   RIGHT TOTAL KNEE REPLACMENT  Date of Surgery:  Clearance TBD                                 Surgeon:  Lara Mulch, MD Surgeon's Group or Practice Name:  [facility] Phone number:  [phone] Fax number:  [phone]   Type of Clearance Requested:   - Medical  - Pharmacy:  Hold Aspirin and Clopidogrel (Plavix) NOT INDICATED   Type of Anesthesia:  Spinal   Additional requests/questions:    Astrid Divine   09/20/2021, 10:21 AM

## 2021-09-22 NOTE — Telephone Encounter (Signed)
Pt has appt 10/04/21 with Ambrose Pancoast, NP for pre op clearance already.

## 2021-10-01 NOTE — Progress Notes (Unsigned)
Office Visit    Patient Name: [name] [name] Date of Encounter: 10/04/2021  Primary Care Provider:  Helen [name]., MD Primary Cardiologist:  [name] More, MD Primary Electrophysiologist: None  Chief Complaint    [name] [name] is a 81 y.o. male with PMH of  CAD s/p stent to Cx 2009, recent NSTEMI s/p DES to [facility] and [name]/dRCA re-cath 2/12 with DES to mid LCx and DES to mid distal RCA., HTN, Hyperlipidemia, PVCs, prostate CA who presents today for preoperative clearance.  Past Medical History    Past Medical History:  Diagnosis Date   Acute myocardial infarction, subendocardial infarction, episode of care unspecified    Arthritis    "left pinky" (03/04/2017)   Cerebral vascular malformation    PT'S NEUROLOGIST IS DR. 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 s/p stent to Cx 2009. b. recent NSTEMI s/p DES to mLCx and [name]/dRCA.   Coronary artery disease involving native coronary artery of native heart with unstable angina pectoris ([place])    Essential hypertension 03/06/2017   Gallstones 08/07/2012   GERD (gastroesophageal reflux disease)    Headache(784.0)    POSSIBLE TRIGEMINAL NEURALGIA    History of MI (myocardial infarction)    Hyperlipidemia    Hypertension    Mass of right kidney    FOUND ON WORK UP FOR ABD PAIN   Murmur    Myocardial infarction [facility]) 04-14-07   NSTEMI (non-ST elevated myocardial infarction) ([place]) 03/04/2017   [name] [name] 03/04/2017   Prostate cancer ([place])    PVC's (premature ventricular contractions)    PVCs (premature ventricular contractions) 9/32/3557   Umbilical hernia    Unstable angina ([name]) 03/22/2017   Past Surgical History:  Procedure Laterality Date   CARDIAC CATHETERIZATION  03/04/2017   CHOLECYSTECTOMY N/A 09/19/2012   Procedure: ROBOTIC ASSISTED LAPAROSCOPIC CHOLECYSTECTOMY WITH UMBILICAL HERNIA REPAIR;  Surgeon: [name] Ruff, MD;  Location:  [facility];  Service: General;  Laterality: N/A;   COLONOSCOPY  07/08/2013   Significant rectal polyp status post polypectomy. Mild sigmoid diverticulosis   CORONARY ANGIOPLASTY WITH STENT PLACEMENT  04/14/07   CORONARY STENT INTERVENTION N/A 03/05/2017   Procedure: CORONARY STENT INTERVENTION;  Surgeon: [name], [name] M, MD;  Location: [place] [facility];  Service: Cardiovascular;  Laterality: N/A;   FLEXIBLE SIGMOIDOSCOPY  09/30/2013   Rectal scar at the area of previous polypectomy- no evidence of recurrance/residual polyp. Mild sigmoid diverticulosis   HERNIA REPAIR  05/30/2010   JOINT REPLACEMENT     LEFT HEART CATH AND CORONARY ANGIOGRAPHY N/A 03/04/2017   Procedure: LEFT HEART CATH AND CORONARY ANGIOGRAPHY;  Surgeon: [name] Crome, MD;  Location: [place] [facility];  Service: Cardiovascular;  Laterality: N/A;   LEFT HEART CATH AND CORONARY ANGIOGRAPHY N/A 03/25/2017   Procedure: LEFT HEART CATH AND CORONARY ANGIOGRAPHY;  Surgeon: [name] Harp, MD;  Location: [place] [facility];  Service: Cardiovascular;  Laterality: N/A;   POLYPECTOMY     PROSTATE SURGERY     RADIOACTIVE SEED IMPLANT FOR PROSTATE CANCER  2 22 2006   REPLACEMENT TOTAL KNEE Left 05/30/2010   ROBOTIC ASSITED PARTIAL NEPHRECTOMY Right 09/19/2012   Procedure: ROBOTIC ASSITED PARTIAL NEPHRECTOMY;  Surgeon: Molli Hazard, MD;  Location: [facility];  Service: Urology;  Laterality: Right;   SHOULDER OPEN ROTATOR CUFF REPAIR Left 03/30/2008   TOTAL HIP ARTHROPLASTY Right 01/2001   TOTAL  HIP REVISION  07/2008   acetabular revision   ULTRASOUND GUIDANCE FOR VASCULAR ACCESS  03/05/2017   Procedure: Ultrasound Guidance For Vascular Access;  Surgeon: [name], [name] M, MD;  Location: [place] [facility];  Service: Cardiovascular;;    Allergies  Allergies  Allergen Reactions   Ativan [Lorazepam] Other (See Comments)    "Sees things."   Lisinopril     Other reaction(s): COUGHING    History of Present Illness    [name] [name]  has a PMH of is a 81 year old male with the above mention past medical history who presents today for follow-up of preoperative surgical clearance.  [name] has an extensive cardiac history with most recent ischemic evaluation completed 2019 when he presented to the ED with complaint of chest pain.  LHC was performed that revealed no culprit lesion and medical therapy was recommended.  He is currently followed by [name] [name] who last saw him 11/2020 for follow-up.  During visit patient was reported doing well with no angina tolerating DAPT.  No medication changes were made during visit as well with no edema present.  He is scheduled to have right total knee replacement and presents today for clearance.  Mr. See presents today for preop clearance visit with his wife.  Since last being seen in the office patient reports he has been doing well with no cardiac complaints.  Patient does report that he has not been taking his atorvastatin since 2020.  He reports leg pain that occurred and patient stopped taking it without advising cardiologist.  We discussed the importance of resuming atorvastatin and patient elects to discuss further with [name] [name].  Patient denies chest pain, palpitations, dyspnea, PND, orthopnea, nausea, vomiting, dizziness, syncope, edema, weight gain, or early satiety.   Home Medications    Current Outpatient Medications  Medication Sig Dispense Refill   amLODipine (NORVASC) 5 MG tablet Take 1 tablet by mouth every morning.     aspirin 81 MG tablet Take 1 tablet (81 mg total) by mouth daily. 30 tablet    clopidogrel (PLAVIX) 75 MG tablet Take 75 mg by mouth daily.     metoprolol tartrate (LOPRESSOR) 25 MG tablet Take 1 tablet (25 mg total) by mouth 2 (two) times daily. 180 tablet 3   nitroGLYCERIN (NITROSTAT) 0.4 MG SL tablet Place 1 tablet (0.4 mg total) under the tongue every 5 (five) minutes as needed for chest pain. 25 tablet 11   pantoprazole (PROTONIX) 40 MG tablet TAKE 1  TABLET BY MOUTH DAILY AT 6:00 AM 90 tablet 0   No current facility-administered medications for this visit.     Review of Systems  Please see the history of present illness.    All other systems reviewed and are otherwise negative except as noted above.  Physical Exam    Wt Readings from Last 3 Encounters:  10/04/21 205 lb (93 kg)  12/14/20 208 lb (94.3 kg)  06/17/20 210 lb 9.6 oz (95.5 kg)   VS: Vitals:   10/04/21 1017  BP: 138/88  Pulse: 64  SpO2: 94%  ,Body mass index is 25.28 kg/[name].  Constitutional:      Appearance: Healthy appearance. Not in distress.  Neck:     Vascular: JVD normal.  Pulmonary:     Effort: Pulmonary effort is normal.     Breath sounds: No wheezing. No rales. Diminished in the bases Cardiovascular:     Normal rate. Regular rhythm. Normal S1. Normal S2.      Murmurs:  There is no murmur.  Edema:    Peripheral edema absent.  Abdominal:     Palpations: Abdomen is soft non tender. There is no hepatomegaly.  Skin:    General: Skin is warm and dry.  Neurological:     General: No focal deficit present.     Mental Status: Alert and oriented to person, place and time.     Cranial Nerves: Cranial nerves are intact.  EKG/LABS/Other Studies Reviewed    ECG personally reviewed by me today -sinus rhythm with rate of 63 and normal axis with no acute changes noted   Lab Results  Component Value Date   WBC 6.4 03/26/2017   HGB 13.7 03/26/2017   HCT 40.1 03/26/2017   MCV 98.3 03/26/2017   PLT 154 03/26/2017   Lab Results  Component Value Date   CREATININE 1.05 12/14/2020   BUN 18 12/14/2020   NA 141 12/14/2020   K 4.3 12/14/2020   CL 103 12/14/2020   CO2 24 12/14/2020   Lab Results  Component Value Date   ALT 14 12/14/2020   AST 22 12/14/2020   ALKPHOS 65 12/14/2020   BILITOT 0.5 12/14/2020   Lab Results  Component Value Date   CHOL 192 12/14/2020   HDL 46 12/14/2020   LDLCALC 129 (H) 12/14/2020   TRIG 96 12/14/2020   CHOLHDL 4.2  12/14/2020    Lab Results  Component Value Date   HGBA1C 5.2 03/05/2017    Assessment & Plan    1.  Surgical clearance: -The patient affirms he has been doing well without any new cardiac symptoms. They are able to achieve 4 METS without cardiac limitations. Therefore, based on ACC/AHA guidelines, the patient would be at acceptable risk for the planned procedure without further cardiovascular testing. The patient was advised that if he develops new symptoms prior to surgery to contact our office to arrange for a follow-up visit, and he verbalized understanding.   Mr. Insley perioperative risk of a major cardiac event is 0.9% according to the Revised Cardiac Risk Index (RCRI).  Therefore, he is at low risk for perioperative complications.   His functional capacity is good at 4.73 METs according to the [facility] Activity Status Index (DASI). Recommendations: According to ACC/AHA guidelines, no further cardiovascular testing needed.  The patient may proceed to surgery at acceptable risk.   Antiplatelet and/or Anticoagulation Recommendations: Aspirin can be held for 7 days prior to his surgery and Plavix can be held 5 days prior.  Please resume Aspirin post operatively when it is felt to be safe from a bleeding standpoint.     2.  Coronary artery disease: -CAD s/p stent to Cx 2009, recent NSTEMI s/p DES to [facility] and [name]/dRCA re-cath 2/12 with DES to mid LCx and DES to mid distal RCA -Today patient reports no angina or other cardiac complaint -Continue GDMT with Lipitor 80 mg, Lopressor 25 mg twice daily Plavix 75 mg, and amlodipine 81 mg  3.  Essential hypertension: -Blood pressure today was well controlled at 138/88 -Continue Lopressor 25 mg twice daily and Norvasc 5 mg daily  4.  Hyperlipidemia: -Patient reports that he is not taking atorvastatin since 2020 due to leg pain. -Patient's last LDL cholesterol was 129 which is above goal of less than 70 -Patient elects to discuss further with  primary cardiologist at this time  Disposition: Follow-up with [name] More, MD as scheduled    Medication Adjustments/Labs and Tests Ordered: Current medicines are reviewed at length with the patient  today.  Concerns regarding medicines are outlined above.   Signed, Mable Fill, Marissa Nestle, NP 10/04/2021, 12:00 PM [place]

## 2021-10-04 ENCOUNTER — Encounter: Payer: Self-pay | Admitting: Nurse Practitioner

## 2021-10-04 ENCOUNTER — Ambulatory Visit: Payer: PPO | Attending: Nurse Practitioner | Admitting: Nurse Practitioner

## 2021-10-04 VITALS — BP 138/88 | HR 64 | Ht 75.5 in | Wt 205.0 lb

## 2021-10-04 DIAGNOSIS — E785 Hyperlipidemia, unspecified: Secondary | ICD-10-CM

## 2021-10-04 DIAGNOSIS — I10 Essential (primary) hypertension: Secondary | ICD-10-CM | POA: Diagnosis not present

## 2021-10-04 DIAGNOSIS — Z01810 Encounter for preprocedural cardiovascular examination: Secondary | ICD-10-CM | POA: Diagnosis not present

## 2021-10-04 NOTE — Patient Instructions (Addendum)
Medication Instructions:  Your physician recommends that you continue on your current medications as directed. Please refer to the Current Medication list given to you today. *If you need a refill on your cardiac medications before your next appointment, please call your pharmacy*   Lab Work: None ordered   Testing/Procedures: None ordered   Follow-Up: At [facility], you and your health needs are our priority.  As part of our continuing mission to provide you with exceptional heart care, we have created designated Provider Care Teams.  These Care Teams include your primary Cardiologist (physician) and Advanced Practice Providers (APPs -  Physician Assistants and Nurse Practitioners) who all work together to provide you with the care you need, when you need it.  We recommend signing up for the patient portal called "MyChart".  Sign up information is provided on this After Visit Summary.  MyChart is used to connect with patients for Virtual Visits (Telemedicine).  Patients are able to view lab/test results, encounter notes, upcoming appointments, etc.  Non-urgent messages can be sent to your provider as well.   To learn more about what you can do with MyChart, go to [email or web address].    Your next appointment:   AS SCHEDULED  The format for your next appointment:   In Person  Provider:   Shirlee More, MD    Other Instructions YOU ARE [place]

## 2021-11-01 ENCOUNTER — Ambulatory Visit: Payer: PPO | Attending: Cardiology | Admitting: Cardiology

## 2021-11-01 ENCOUNTER — Encounter: Payer: Self-pay | Admitting: Cardiology

## 2021-11-01 VITALS — BP 130/70 | HR 63 | Ht 73.0 in | Wt 208.0 lb

## 2021-11-01 DIAGNOSIS — Z789 Other specified health status: Secondary | ICD-10-CM

## 2021-11-01 DIAGNOSIS — Z01810 Encounter for preprocedural cardiovascular examination: Secondary | ICD-10-CM

## 2021-11-01 DIAGNOSIS — I10 Essential (primary) hypertension: Secondary | ICD-10-CM | POA: Diagnosis not present

## 2021-11-01 DIAGNOSIS — I25110 Atherosclerotic heart disease of native coronary artery with unstable angina pectoris: Secondary | ICD-10-CM | POA: Diagnosis not present

## 2021-11-01 DIAGNOSIS — E7800 Pure hypercholesterolemia, unspecified: Secondary | ICD-10-CM | POA: Diagnosis not present

## 2021-11-01 MED ORDER — EVOLOCUMAB 140 MG/ML ~~LOC~~ SOSY: 140.0000 mg | PREFILLED_SYRINGE | SUBCUTANEOUS | 3 refills | Status: DC

## 2021-11-01 NOTE — Progress Notes (Signed)
Cardiology Office Note:    Date:  11/01/2021   ID:  [name] [name], DOB [birth year], MRN [id number]  PCP:  Helen Hashimoto., MD  Cardiologist:  Shirlee More, MD    Referring MD: Helen Hashimoto., MD    ASSESSMENT:    1. Preoperative cardiovascular examination   2. Pure hypercholesterolemia   3. Statin intolerance   4. Coronary artery disease involving native coronary artery of native heart with unstable angina pectoris ([place])   5. Essential hypertension    PLAN:    In order of problems listed above:  From a cardiology perspective he is optimized for his planned intermediate risk orthopedic elective surgery he has chronic stable CAD and will stop his aspirin and clopidogrel 1 week preoperatively I would like to see him resume aspirin about 48 hours after surgery and clopidogrel when safe from a bleeding perspective.  He should go to a monitored bed postoperative day 1 EKG the next morning after surgery he has had asymptomatic PVCs in the past.  Any medical cardiology problems contact heart care High risk with previous ACS PCI and stent initiate PCSK9 therapy as he is statin intolerant Stable CAD optimized for his planned surgical procedure Stable repeat blood pressure by me in the office is normal, I have asked him to start to trend his home blood pressure record good technique valid device we will continue treatment with metoprolol   Next appointment: 1 year   Medication Adjustments/Labs and Tests Ordered: Current medicines are reviewed at length with the patient today.  Concerns regarding medicines are outlined above.  No orders of the defined types were placed in this encounter.  No orders of the defined types were placed in this encounter.   Chief Complaint  Patient presents with   Clearance TBD    TRK Right Lara Mulch    History of Present Illness:    [name] [name] is a 81 y.o. male with a hx of  CAD with PCI to left circumflex 04/15/2007 and subsequent  PCI and stent mid  left circumflex and OM1 February 2019 hypertension symptomatic orthostatic hypotension and hyperlipidemia  last seen 12/14/2020.  Compliance with diet, lifestyle and medications: Yes  He is here for yearly visit but tells me he anticipates right total knee arthroplasty 12/11/2021.  This turned into a preoperative visit. Recent labs PCP 07/19/2021 cholesterol 177 LDL 118 hemoglobin 15.6 creatinine 1.06 potassium 4.3 He is statin intolerant we have tried multiple high and low intensity statins and all of them cause intolerable muscle pain and weakness. With high risk CAD and ongoing hyperlipidemia he agrees to try PCSK9 inhibitor Repatha. He has knee pain but still works he is not having exercise intolerance shortness of breath chest pain palpitation or syncope and his exercise tolerance exceeds 4 METS Past Medical History:  Diagnosis Date   Acute myocardial infarction, subendocardial infarction, episode of care unspecified    Arthritis    "left pinky" (03/04/2017)   Cerebral vascular malformation    PT'S NEUROLOGIST IS DR. 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 s/p stent to Cx 2009. b. recent NSTEMI s/p DES to mLCx and m/dRCA.   Coronary artery disease involving native coronary artery of native heart with unstable angina pectoris ([place])    Essential hypertension 03/06/2017   Gallstones 08/07/2012   GERD (gastroesophageal reflux disease)    Headache(784.0)  POSSIBLE TRIGEMINAL NEURALGIA    History of MI (myocardial infarction)    Hyperlipidemia    Hypertension    Mass of right kidney    FOUND ON WORK UP FOR ABD PAIN   Murmur    Myocardial infarction [facility]) 04-14-07   NSTEMI (non-ST elevated myocardial infarction) ([place]) 03/04/2017   [name] 03/04/2017   Prostate cancer ([place])    PVC's (premature ventricular contractions)    PVCs (premature ventricular contractions)  04/06/1759   Umbilical hernia    Unstable angina ([place]) 03/22/2017    Past Surgical History:  Procedure Laterality Date   CARDIAC CATHETERIZATION  03/04/2017   CHOLECYSTECTOMY N/A 09/19/2012   Procedure: ROBOTIC ASSISTED LAPAROSCOPIC CHOLECYSTECTOMY WITH UMBILICAL HERNIA REPAIR;  Surgeon: Leighton Ruff, MD;  Location: [facility];  Service: General;  Laterality: N/A;   COLONOSCOPY  07/08/2013   Significant rectal polyp status post polypectomy. Mild sigmoid diverticulosis   CORONARY ANGIOPLASTY WITH STENT PLACEMENT  04/14/07   CORONARY STENT INTERVENTION N/A 03/05/2017   Procedure: CORONARY STENT INTERVENTION;  Surgeon: Martinique, Peter M, MD;  Location: [place] [facility];  Service: Cardiovascular;  Laterality: N/A;   FLEXIBLE SIGMOIDOSCOPY  09/30/2013   Rectal scar at the area of previous polypectomy- no evidence of recurrance/residual polyp. Mild sigmoid diverticulosis   HERNIA REPAIR  05/30/2010   JOINT REPLACEMENT     LEFT HEART CATH AND CORONARY ANGIOGRAPHY N/A 03/04/2017   Procedure: LEFT HEART CATH AND CORONARY ANGIOGRAPHY;  Surgeon: Belva Crome, MD;  Location: [place] [facility];  Service: Cardiovascular;  Laterality: N/A;   LEFT HEART CATH AND CORONARY ANGIOGRAPHY N/A 03/25/2017   Procedure: LEFT HEART CATH AND CORONARY ANGIOGRAPHY;  Surgeon: Lorretta Harp, MD;  Location: [place] [facility];  Service: Cardiovascular;  Laterality: N/A;   POLYPECTOMY     PROSTATE SURGERY     RADIOACTIVE SEED IMPLANT FOR PROSTATE CANCER  2 22 2006   REPLACEMENT TOTAL KNEE Left 05/30/2010   ROBOTIC ASSITED PARTIAL NEPHRECTOMY Right 09/19/2012   Procedure: ROBOTIC ASSITED PARTIAL NEPHRECTOMY;  Surgeon: Molli Hazard, MD;  Location: [facility];  Service: Urology;  Laterality: Right;   SHOULDER OPEN ROTATOR CUFF REPAIR Left 03/30/2008   TOTAL HIP ARTHROPLASTY Right 01/2001   TOTAL HIP REVISION  07/2008   acetabular revision   ULTRASOUND GUIDANCE FOR VASCULAR ACCESS  03/05/2017   Procedure: Ultrasound  Guidance For Vascular Access;  Surgeon: Martinique, Peter M, MD;  Location: [place] [facility];  Service: Cardiovascular;;    Current Medications: Current Meds  Medication Sig   amLODipine (NORVASC) 5 MG tablet Take 1 tablet by mouth every morning.   aspirin 81 MG tablet Take 1 tablet (81 mg total) by mouth daily.   clopidogrel (PLAVIX) 75 MG tablet Take 75 mg by mouth daily.   metoprolol tartrate (LOPRESSOR) 25 MG tablet Take 1 tablet (25 mg total) by mouth 2 (two) times daily.   nitroGLYCERIN (NITROSTAT) 0.4 MG SL tablet Place 1 tablet (0.4 mg total) under the tongue every 5 (five) minutes as needed for chest pain.   pantoprazole (PROTONIX) 40 MG tablet TAKE 1 TABLET BY MOUTH DAILY AT 6:00 AM (Patient taking differently: Take 40 mg by mouth daily.)     Allergies:   Ativan [lorazepam] and Lisinopril   Social History   Socioeconomic History   Marital status: Married    Spouse name: Not on file   Number of children: Not on file   Years of education: Not on file   Highest education  level: Not on file  Occupational History   Not on file  Tobacco Use   Smoking status: Former    Packs/day: 1.00    Years: 30.00    Total pack years: 30.00    Types: Cigarettes   Smokeless tobacco: Never   Tobacco comments:    "stopped ~ 1980"  Vaping Use   Vaping Use: Never used  Substance and Sexual Activity   Alcohol use: No   Drug use: No   Sexual activity: Not on file  Other Topics Concern   Not on file  Social History Narrative   Not on file   Social Determinants of Health   Financial Resource Strain: Not on file  Food Insecurity: Not on file  Transportation Needs: Not on file  Physical Activity: Not on file  Stress: Not on file  Social Connections: Not on file     Family History: The patient's family history includes Heart attack in his mother; Lung cancer in his sister. There is no history of Colon cancer, Esophageal cancer, Colon polyps, Rectal cancer, or Stomach cancer. ROS:    Please see the history of present illness.    All other systems reviewed and are negative.  EKGs/Labs/Other Studies Reviewed:    The following studies were reviewed today:  EKG:  EKG ordered today and personally reviewed.  The ekg ordered today demonstrates sinus rhythm first-degree AV block otherwise normal EKG  Recent Labs: 12/14/2020: ALT 14; BUN 18; Creatinine, Ser 1.05; Potassium 4.3; Sodium 141  Recent Lipid Panel    Component Value Date/Time   CHOL 192 12/14/2020 1127   TRIG 96 12/14/2020 1127   HDL 46 12/14/2020 1127   CHOLHDL 4.2 12/14/2020 1127   CHOLHDL 4.3 03/05/2017 0350   VLDL 24 03/05/2017 0350   LDLCALC 129 (H) 12/14/2020 1127    Physical Exam:    VS:  BP (!) 146/88   Pulse 63   Ht '6\' 1"'$  (1.854 m)   Wt 208 lb (94.3 kg)   SpO2 94%   BMI 27.44 kg/m     Wt Readings from Last 3 Encounters:  11/01/21 208 lb (94.3 kg)  10/04/21 205 lb (93 kg)  12/14/20 208 lb (94.3 kg)     GEN:  Well nourished, well developed in no acute distress HEENT: Normal NECK: No JVD; No carotid bruits LYMPHATICS: No lymphadenopathy CARDIAC: RRR, no murmurs, rubs, gallops RESPIRATORY:  Clear to auscultation without rales, wheezing or rhonchi  ABDOMEN: Soft, non-tender, non-distended MUSCULOSKELETAL:  No edema; No deformity  SKIN: Warm and dry NEUROLOGIC:  Alert and oriented x 3 PSYCHIATRIC:  Normal affect    Signed, Shirlee More, MD  11/01/2021 8:38 AM    [place]

## 2021-11-01 NOTE — Patient Instructions (Signed)
[facility]  Tips to measure your blood pressure correctly  To determine whether you have hypertension, a medical professional will take a blood pressure reading. How you prepare for the test, the position of your arm, and other factors can change a blood pressure reading by 10% or more. That could be enough to hide high blood pressure, start you on a drug you don't really need, or lead your doctor to incorrectly adjust your medications. National and international guidelines offer specific instructions for measuring blood pressure. If a doctor, nurse, or medical assistant isn't doing it right, don't hesitate to ask him or her to get with the guidelines. Here's what you can do to ensure a correct reading:  Don't drink a caffeinated beverage or smoke during the 30 minutes before the test.  Sit quietly for five minutes before the test begins.  During the measurement, sit in a chair with your feet on the floor and your arm supported so your elbow is at about heart level.  The inflatable part of the cuff should completely cover at least 80% of your upper arm, and the cuff should be placed on bare skin, not over a shirt.  Don't talk during the measurement.  Have your blood pressure measured twice, with a brief break in between. If the readings are different by 5 points or more, have it done a third time. There are times to break these rules. If you sometimes feel lightheaded when getting out of bed in the morning or when you stand after sitting, you should have your blood pressure checked while seated and then while standing to see if it falls from one position to the next. Because blood pressure varies throughout the day, your doctor will rarely diagnose hypertension on the basis of a single reading. Instead, he or she will want to confirm the measurements on at least two occasions, usually within a few weeks of one another. The exception to this rule is if you have a blood pressure reading of 180/110 mm Hg  or higher. A result this high usually calls for prompt treatment. It's also a good idea to have your blood pressure measured in both arms at least once, since the reading in one arm (usually the right) may be higher than that in the left. A 2014 study in The American Journal of Medicine of nearly 3,400 people found average arm- to-arm differences in systolic blood pressure of about 5 points. The higher number should be used to make treatment decisions. In 2017, new guidelines from the [place], the [employer] of Cardiology, and nine other health organizations lowered the diagnosis of high blood pressure to 130/80 mm Hg or higher for all adults. The guidelines also redefined the various blood pressure categories to now include normal, elevated, Stage 1 hypertension, Stage 2 hypertension, and hypertensive crisis (see "Blood pressure categories"). Blood pressure categories  Blood pressure category SYSTOLIC (upper number)  DIASTOLIC (lower number)  Normal Less than 120 mm Hg and Less than 80 mm Hg  Elevated 120-129 mm Hg and Less than 80 mm Hg  High blood pressure: Stage 1 hypertension 130-139 mm Hg or 80-89 mm Hg  High blood pressure: Stage 2 hypertension 140 mm Hg or higher or 90 mm Hg or higher  Hypertensive crisis (consult your doctor immediately) Higher than 180 mm Hg and/or Higher than 120 mm Hg  Source: American Heart Association and American Stroke Association. For more on getting your blood pressure under control, buy Controlling Your Blood  Pressure, a Special Health Report from [facility]. Medication Instructions:  Your physician has recommended you make the following change in your medication:  We will send in Learned to be administered every 2 weeks for cholesterol   *If you need a refill on your cardiac medications before your next appointment, please call your pharmacy*   Lab Work: Your physician recommends that you return for lab work in: 2 months  after starting [place] for fasting Lipid Panel Lab opens at [facility] an appointment. Best time to come is between 8am and 12noon and between 1:30 and 4:30. If you have been asked to fast for your blood work please have nothing to eat or drink after midnight. You may have water.   If you have labs (blood work) drawn today and your tests are completely normal, you will receive your results only by: [place] (if you have MyChart) OR A paper copy in the mail If you have any lab test that is abnormal or we need to change your treatment, we will call you to review the results.   Testing/Procedures: NONE   Follow-Up: At [facility], you and your health needs are our priority.  As part of our continuing mission to provide you with exceptional heart care, we have created designated Provider Care Teams.  These Care Teams include your primary Cardiologist (physician) and Advanced Practice Providers (APPs -  Physician Assistants and Nurse Practitioners) who all work together to provide you with the care you need, when you need it.  We recommend signing up for the patient portal called "MyChart".  Sign up information is provided on this After Visit Summary.  MyChart is used to connect with patients for Virtual Visits (Telemedicine).  Patients are able to view lab/test results, encounter notes, upcoming appointments, etc.  Non-urgent messages can be sent to your provider as well.   To learn more about what you can do with MyChart, go to [email or web address].    Your next appointment:   1 year(s)  The format for your next appointment:   In Person  Provider:   Shirlee More, MD    Other Instructions Stop aspirin and Clopidigrel 1 week prior to surgery  Important Information About Sugar

## 2021-11-01 NOTE — Addendum Note (Signed)
Addended by: Jerl Santos R on: 11/01/2021 04:35 PM   Modules accepted: Orders

## 2021-11-01 NOTE — Addendum Note (Signed)
Addended by: Jerl Santos R on: 11/01/2021 09:11 AM   Modules accepted: Orders

## 2021-11-03 ENCOUNTER — Telehealth: Payer: Self-pay | Admitting: Cardiology

## 2021-11-03 NOTE — Telephone Encounter (Signed)
Pt c/o medication issue:  1. Name of Medication: Repatha   2. How are you currently taking this medication (dosage and times per day)? N/A  3. Are you having a reaction (difficulty breathing--STAT)? N/A  4. What is your medication issue? Pharmacy is calling wanting to know if the PA sent to the office on 10/11 was received. Please advise.

## 2021-11-06 ENCOUNTER — Ambulatory Visit: Payer: Self-pay | Admitting: Licensed Clinical Social Worker

## 2021-11-06 DIAGNOSIS — Z419 Encounter for procedure for purposes other than remedying health state, unspecified: Secondary | ICD-10-CM | POA: Diagnosis not present

## 2021-11-06 NOTE — Patient Outreach (Signed)
  Care Coordination   Initial Visit Note   11/06/2021 Name: [name] [name] MRN: [id number] DOB: 01/10/1941  [name] [name] is a 81 y.o. year old male who sees Helen Hashimoto., MD for primary care. I spoke with  [name] by phone today.  What matters to the patients health and wellness today?  Patient Declined    Goals Addressed               This Visit's Progress     Care Coordination Activiites- Patient Declined (pt-stated)        Patient declined needing any interventions at this time.        SDOH assessments and interventions completed:  Yes     Care Coordination Interventions Activated:  Yes  Care Coordination Interventions:  Yes, provided   Follow up plan: No further intervention required.   Encounter Outcome:  No Answer

## 2021-11-06 NOTE — Patient Instructions (Signed)
Visit Information  Thank you for taking time to visit with me today. Please don't hesitate to contact me if I can be of assistance to you.   Following are the goals we discussed today:   Goals Addressed               This Visit's Progress     Care Coordination Activiites- Patient Declined (pt-stated)        Patient declined needing any interventions at this time.         Patient verbalizes understanding of instructions and care plan provided today and agrees to view in [place]. Active MyChart status and patient understanding of how to access instructions and care plan via MyChart confirmed with patient.     No further follow up required: .  Milus Height, MSW, [place] Social Worker [facility] Care Management  [phone]

## 2021-11-06 NOTE — Telephone Encounter (Signed)
Lattie Haw with [facility] calling back to follow up on whether the prior authorization is still being worked on or if it has been sent to the plan. Phone: [phone], can speak with anyone who answers

## 2021-11-08 NOTE — Telephone Encounter (Signed)
Message from Plan [id number] [place] '140MG'$ /ML [place] SOAJ Quantity:2;  Jimmy with [employer] notified of approval

## 2021-11-08 NOTE — Telephone Encounter (Signed)
Prior authorization started for Repatha through [place] (Key: [id number]) Repatha SureClick '140MG'$ /ML auto-injectors   Form [employer] Electronic Prior Authorization Form 2017 NCPDP

## 2021-12-07 DIAGNOSIS — Q283 Other malformations of cerebral vessels: Secondary | ICD-10-CM | POA: Diagnosis not present

## 2021-12-07 DIAGNOSIS — Z6828 Body mass index (BMI) 28.0-28.9, adult: Secondary | ICD-10-CM | POA: Diagnosis not present

## 2022-01-24 DIAGNOSIS — R110 Nausea: Secondary | ICD-10-CM | POA: Diagnosis not present

## 2022-01-24 DIAGNOSIS — Z6828 Body mass index (BMI) 28.0-28.9, adult: Secondary | ICD-10-CM | POA: Diagnosis not present

## 2022-01-24 DIAGNOSIS — I10 Essential (primary) hypertension: Secondary | ICD-10-CM | POA: Diagnosis not present

## 2022-01-24 DIAGNOSIS — D692 Other nonthrombocytopenic purpura: Secondary | ICD-10-CM | POA: Diagnosis not present

## 2022-01-24 DIAGNOSIS — I2510 Atherosclerotic heart disease of native coronary artery without angina pectoris: Secondary | ICD-10-CM | POA: Diagnosis not present

## 2022-01-24 DIAGNOSIS — E782 Mixed hyperlipidemia: Secondary | ICD-10-CM | POA: Diagnosis not present

## 2022-01-31 ENCOUNTER — Encounter: Payer: Self-pay | Admitting: Neurology

## 2022-01-31 ENCOUNTER — Ambulatory Visit (INDEPENDENT_AMBULATORY_CARE_PROVIDER_SITE_OTHER): Payer: PPO | Admitting: Neurology

## 2022-01-31 VITALS — BP 173/84 | HR 60 | Ht 75.0 in | Wt 208.0 lb

## 2022-01-31 DIAGNOSIS — R930 Abnormal findings on diagnostic imaging of skull and head, not elsewhere classified: Secondary | ICD-10-CM | POA: Diagnosis not present

## 2022-01-31 DIAGNOSIS — Q283 Other malformations of cerebral vessels: Secondary | ICD-10-CM

## 2022-01-31 NOTE — Patient Instructions (Signed)
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  This is likely a benign vascular abnormality and unlikely to cause any symptoms and can be followed conservatively.  Patient does not even want follow-up brain imaging at the present time as the findings are unlikely to impact treatment.  No scheduled neurological follow-up necessary with me needed.  Patient is neurologically cleared to undergo knee surgery as I do not anticipate any neurological concerns at the current time.  Vascular Malformation A vascular malformation is a defect in the development of a blood or lymph vessel. Vascular malformations are present at birth, but they may not cause signs or symptoms until years later. Vascular malformations can occur anywhere that blood or lymph circulates in your body. Lymph is fluid that is part of the body's disease-fighting (immune) system. There are four types of vascular malformations. Venous malformations. These affect the veins, which are blood vessels that carry blood back to the heart. These are the most common type. Arteriovenous malformations. These involve veins and arteries. Arteries are blood vessels that carry oxygen-rich blood away from the heart. Lymphatic malformations. These affect lymph vessels. These vessels carry fluid through the body that helps to fight infections. Capillary malformations. These affect capillaries, which are tiny blood vessels that connect veins to arteries. What are the causes? This condition is caused by a defect that occurs before a child is born. In most cases, the cause of the error is not known. In some cases, it may be caused by a change in a gene (mutation). What are the signs or symptoms? The main symptom of this condition is a skin blemish or lump that swells, bleeds, or is painful. Other signs and symptoms  depend on the type, location, and size of the malformation. Venous malformations. These commonly appear on the face, arms, legs, or the area between the neck and groin (torso). They may have a bluish appearance. Arteriovenous malformations. These have different symptoms depending on where they grow in the body. Brain malformations may cause a headache or seizure. They can also cause weakness, dizziness, confusion, loss of speech, or other symptoms of a bleeding (hemorrhagic) stroke. These malformations can be life-threatening. Malformations in the head or neck may cause vision changes or difficulty with breathing or swallowing. Spinal cord malformations may cause back pain, weakness, or inability to feel or move (paralysis). Chest malformations may cause shortness of breath, fatigue, a cough, or coughing up blood. Lymphatic malformations. These cause swelling on the face and neck. Capillary malformations. These may look like a purple stain (port-wine stain) on the face. Over time, the stain may become darker and the skin may become thicker. How is this diagnosed? This condition may be diagnosed based on: A physical exam. Some malformations can be seen or felt on the body. Tests that create detailed images of the body. These may include: Ultrasound. This test uses sound waves to make images. MRI. This test is used to diagnose deep vascular malformations. CT scan. This uses X-rays to make a three-dimensional image. Angiogram. This is a type of X-ray that is taken after a dye is injected into an artery. This imaging is most important for diagnosing arteriovenous malformations. How is this treated? Treatment depends on the type, size, and location of the malformation as well as on any symptoms. In some cases, treatment may not be needed. The malformation will  be checked often to see if it is growing. If needed, treatment may include: Laser treatment. This may be used to treat malformations near the  skin. This is often the best treatment for capillary malformations. Embolization. This treatment blocks blood flow and shrinks the malformation. Sclerotherapy. An irritating substance is inserted into a blood vessel or lymphatic vessel. This causes the vessel to shrink and become a scar. Radiosurgery. This is an X-ray treatment to destroy a specific spot or to shrink a malformation. You may have this treatment to treat arteriovenous malformations in the brain or spinal cord. Surgery. Some vascular malformations can be removed surgically. You may have other treatments to shrink the malformation before surgery. Follow these instructions at home: Take over-the-counter and prescription medicines only as told by your health care provider. Return to your normal activities as told by your health care provider. Ask your health care provider what activities are safe for you. Keep all follow-up visits. This is important. Contact a health care provider if: You have a fever. You have pain. You have a vascular malformation near your skin that changes size, bleeds, or becomes painful. You have a cough. You have difficulty swallowing. You have a headache, backache, or loss of feeling (numbness). You have dizziness, weakness, or confusion. Get help right away if:  You have trouble breathing. You have severe bleeding. You cough up blood. You have a severe headache or back pain. You have loss of movement. You have any symptoms of a stroke. "BE FAST" is an easy way to remember the main warning signs of a stroke: B - Balance. Signs are dizziness, sudden trouble walking, or loss of balance. E - Eyes. Signs are trouble seeing or a sudden change in vision. F - Face. Signs are sudden weakness or numbness of the face, or the face or eyelid drooping on one side. A - Arms. Signs are weakness or numbness in an arm. This happens suddenly and usually on one side of the body. S - Speech. Signs are sudden trouble  speaking, slurred speech, or trouble understanding what people say. T - Time. Time to call emergency services. Write down what time symptoms started. You have other signs of a stroke, such as: A sudden, severe headache with no known cause. Nausea or vomiting. Seizure. These symptoms may represent a serious problem that is an emergency. Do not wait to see if the symptoms will go away. Get medical help right away. Call your local emergency services (911 in the [place] Do not drive yourself to the hospital. Summary A vascular malformation is an abnormal development of a blood or lymph vessel that you are born with. Symptoms depend on the type, size, and location of the malformation that you have. You might not develop symptoms for many years. Imaging studies are needed to diagnose vascular malformations inside the body. The best treatment will depend on the type, size, symptoms, and location of the malformation. This information is not intended to replace advice given to you by your health care provider. Make sure you discuss any questions you have with your health care provider. Document Revised: 06/21/2020 Document Reviewed: 06/21/2020 Elsevier Patient Education  [place].

## 2022-01-31 NOTE — Progress Notes (Signed)
[facility] [address] [place]. [place] [address] [phone]       OFFICE CONSULT NOTE  [name] [name] Date of Birth:  15-Dec-1940 Medical Record Number:  [id number]   Referring MD:  Jenean Lindau  Reason for Referral:  Brain telangiectasia  HPI: [name] [name] is a 82 year old Caucasian male seen today for initial office consultation visit companied by his wife.  History is obtained from them and review of [place].  No actual imaging films are available but reports were reviewed in care everywhere.  Patient currently denies any neurological symptoms in the form of headaches, gait or balance difficulties.  He has no history of migraines, seizures, strokes or TIAs.  He was having some headaches and 2013 and had an outpatient MRI scan of the brain done showed 3 faint enhancing medial frontal involving all likely gyri suggestive of capillary telangiectasia.  He was referred to Dr. Caryl Comes  neurosurgeon at [facility] on 11/23/2011 who recommended conservative follow-up and felt these lesions were asymptomatic and benign.  Patient was advised to have repeat MRI in a year but he did not do so.  He was lost to follow-up.  He recently was having knee problems and is scheduled to undergo knee replacement surgery but has been referred to me for neurological clearance.  Patient was seen in 2011 by Dr. 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  MRI was subsequently read by Dr. Marijean Bravo to suggest small intracranial capillary telangiectasia but I do not have those records to review today  ROS:   14 system review of systems is positive for no complaints today  PMH:  Past Medical History:  Diagnosis Date   Acute myocardial infarction, subendocardial infarction, episode of care unspecified    Arthritis    "left pinky" (03/04/2017)   Cerebral vascular malformation    PT'S NEUROLOGIST  IS DR. 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 s/p stent to Cx 2009. b. recent NSTEMI s/p DES to mLCx and m/dRCA.   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    History of MI (myocardial infarction)    Hyperlipidemia    Hypertension    Mass of right kidney    FOUND ON WORK UP FOR ABD PAIN   Murmur    Myocardial infarction [facility]) 04-14-07   NSTEMI (non-ST elevated myocardial infarction) ([place]) 03/04/2017   [name] 03/04/2017   Prostate cancer ([place])    PVC's (premature ventricular contractions)    PVCs (premature ventricular contractions) 09/08/5629   Umbilical hernia    Unstable angina ([place]) 03/22/2017    Social History:  Social History   Socioeconomic History   Marital status: Married    Spouse name: Not on file   Number of children: Not on file   Years of education: Not on file   Highest education level: Not on file  Occupational History   Not on file  Tobacco Use   Smoking status: Former    Packs/day: 1.00    Years: 30.00    Total pack years: 30.00    Types: Cigarettes   Smokeless tobacco: Never   Tobacco  comments:    "stopped ~ 1980"  Vaping Use   Vaping Use: Never used  Substance and Sexual Activity   Alcohol use: No   Drug use: No   Sexual activity: Not on file  Other Topics Concern   Not on file  Social History Narrative   Not on file   Social Determinants of Health   Financial Resource Strain: Not on file  Food Insecurity: No Food Insecurity (11/06/2021)   Hunger Vital Sign    Worried About Running Out of Food in the Last Year: Never true    Ran Out of Food in the Last Year: Never true  Transportation Needs: No Transportation Needs  (11/06/2021)   PRAPARE - Hydrologist (Medical): No    Lack of Transportation (Non-Medical): No  Physical Activity: Not on file  Stress: Not on file  Social Connections: Not on file  Intimate Partner Violence: Not on file    Medications:   Current Outpatient Medications on File Prior to Visit  Medication Sig Dispense Refill   amLODipine (NORVASC) 5 MG tablet Take 1 tablet by mouth every morning.     aspirin 81 MG tablet Take 1 tablet (81 mg total) by mouth daily. 30 tablet    clopidogrel (PLAVIX) 75 MG tablet Take 75 mg by mouth daily.     Evolocumab 140 MG/ML SOSY Inject 140 mg into the skin every 14 (fourteen) days. 6 mL 3   metoprolol tartrate (LOPRESSOR) 25 MG tablet Take 1 tablet (25 mg total) by mouth 2 (two) times daily. 180 tablet 3   nitroGLYCERIN (NITROSTAT) 0.4 MG SL tablet Place 1 tablet (0.4 mg total) under the tongue every 5 (five) minutes as needed for chest pain. 25 tablet 11   pantoprazole (PROTONIX) 40 MG tablet TAKE 1 TABLET BY MOUTH DAILY AT 6:00 AM (Patient taking differently: Take 40 mg by mouth daily.) 90 tablet 0   No current facility-administered medications on file prior to visit.    Allergies:   Allergies  Allergen Reactions   Ativan [Lorazepam] Other (See Comments)    "Sees things."   Lisinopril     Other reaction(s): COUGHING    Physical Exam General: Pleasant elderly Caucasian male well developed, well nourished, seated, in no evident distress Head: head normocephalic and atraumatic.   Neck: supple with no carotid or supraclavicular bruits Cardiovascular: regular rate and rhythm, no murmurs Musculoskeletal: no deformity Skin:  no rash/petichiae Vascular:  Normal pulses all extremities  Neurologic Exam Mental Status: Awake and fully alert. Oriented to place and time. Recent and remote memory intact. Attention span, concentration and fund of knowledge appropriate. Mood and affect appropriate.  Cranial Nerves:  Fundoscopic exam reveals sharp disc margins. Pupils equal, briskly reactive to light. Extraocular movements full without nystagmus. Visual fields full to confrontation. Hearing intact. Facial sensation intact. Face, tongue, palate moves normally and symmetrically.  Motor: Normal bulk and tone. Normal strength in all tested extremity muscles. Sensory.: intact to touch , pinprick , position and vibratory sensation.  Coordination: Rapid alternating movements normal in all extremities. Finger-to-nose and heel-to-shin performed accurately bilaterally. Gait and Station: Arises from chair without difficulty. Stance is normal. Gait demonstrates normal stride length and balance . Able to heel, toe and tandem walk with mild difficulty.  Reflexes: 1+ and symmetric. Toes downgoing.   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  This is likely a benign vascular abnormality and unlikely to cause any symptoms and can be followed conservatively.  Patient does not even want follow-up brain imaging at the present time as the findings are unlikely to impact treatment.  No scheduled neurological follow-up necessary with me needed.  Patient is neurologically cleared to undergo knee surgery as I do not anticipate any neurological concerns at the current time.  Greater than 50% time during this 45-minute consultation visit was spent on counseling and coordination of care about his cerebral capillary telangiectasia incidentally  discovered on brain MRI  Antony Contras, MD Note: This document was prepared with digital dictation and possible smart phrase technology. Any transcriptional errors that result from this process are unintentional.

## 2022-02-12 ENCOUNTER — Telehealth: Payer: Self-pay | Admitting: Neurology

## 2022-02-12 NOTE — Telephone Encounter (Signed)
Pt wife is calling. Stated she gave Dr. Leonie Man a surgery clearance form on 1/10. She was told form was going to be filled out the same day and faxed to the surgeon. She is requesting a call back from nurse to discuss.

## 2022-02-13 NOTE — Telephone Encounter (Signed)
Pt is calling again. Asking if you can re-fax form today please because they didn't receive.

## 2022-02-13 NOTE — Telephone Encounter (Signed)
Phone rep called and after checking DPR message from Fayrene Fearing was relayed to pt's wife.

## 2022-02-13 NOTE — Telephone Encounter (Signed)
I have faxed medical clearance pw.   Thanks.

## 2022-03-16 NOTE — Progress Notes (Signed)
Sent message, via epic in basket, requesting orders in epic from surgeon.  

## 2022-03-21 ENCOUNTER — Other Ambulatory Visit: Payer: Self-pay | Admitting: Orthopedic Surgery

## 2022-03-21 DIAGNOSIS — G8929 Other chronic pain: Secondary | ICD-10-CM

## 2022-03-21 NOTE — Progress Notes (Signed)
Second request for orders in [facility]: Spoke with Loma Sousa, she stated Dr Ronnie Derby is aware and will place orders in [facility] this afternoon.

## 2022-03-22 NOTE — Patient Instructions (Addendum)
DUE TO COVID-19 ONLY TWO VISITORS  (aged 81 and older)  ARE ALLOWED TO COME WITH YOU AND STAY IN THE WAITING ROOM ONLY DURING PRE OP AND PROCEDURE.   **NO VISITORS ARE ALLOWED IN THE SHORT STAY AREA OR RECOVERY ROOM!!**  IF YOU WILL BE ADMITTED INTO THE HOSPITAL YOU ARE ALLOWED ONLY FOUR SUPPORT PEOPLE DURING VISITATION HOURS ONLY (7 AM -8PM)   The support person(s) must pass our screening, gel in and out, and wear a mask at all times, including in the patient's room. Patients must also wear a mask when staff or their support person are in the room. Visitors GUEST BADGE MUST BE WORN VISIBLY  One adult visitor may remain with you overnight and MUST be in the room by 8 P.M.     Your procedure is scheduled on: 03/26/22   Report to [facility] Main Entrance    Report to admitting at   7:10 AM   Call this number if you have problems the morning of surgery [phone]   Do not eat food :After Midnight.   After Midnight you may have the following liquids until _6:25_____ AM/ DAY OF SURGERY  Water Black Coffee (sugar ok, NO MILK/CREAM OR CREAMERS)  Tea (sugar ok, NO MILK/CREAM OR CREAMERS) regular and decaf                             Plain Jell-O (NO RED)                                           Fruit ices (not with fruit pulp, NO RED)                                     Popsicles (NO RED)                                                                  Juice: apple, WHITE grape, WHITE cranberry Sports drinks like Gatorade (NO RED)                  The day of surgery:  Drink ONE (1) Pre-Surgery Clear Ensure  at 6:00 AM the morning of surgery. Drink in one sitting. Do not sip.  This drink was given to you during your hospital  pre-op appointment visit. Nothing else to drink after completing the  Pre-Surgery Clear Ensure at 6:25 AM          If you have questions, please contact your surgeon's office.    Oral Hygiene is also important to reduce your risk of infection.                                     Remember - BRUSH YOUR TEETH THE MORNING OF SURGERY WITH YOUR REGULAR TOOTHPASTE  DENTURES WILL BE REMOVED PRIOR TO SURGERY PLEASE DO NOT APPLY "Poly grip" OR ADHESIVES!!!   Do NOT smoke after Midnight   Take these medicines  the morning of surgery with A SIP OF WATER: Metoprolol                                                                                                                           Pantoprazole   Before surgery.Stop taking _Plavix_hold 5 days  (120hurs) prior to surgery. Last dose__on __2/27/24________as instructed by _____________.  Stop taking ASA 81 mg____________as directed by your Surgeon/Cardiologist.  Contact your Surgeon/Cardiologist for instructions on Anticoagulant Therapy prior to surgery.  Bring CPAP mask and tubing day of surgery.                              You may not have any metal on your body including  jewelry, and body piercing             Do not wear lotions, powders, cologne, or deodorant                Men may shave face and neck.   Do not bring valuables to the hospital. [place].   Contacts, glasses, or bridgework may not be worn into surgery.   Bring small overnight bag day of surgery.   DO NOT [place]. PHARMACY WILL DISPENSE MEDICATIONS LISTED ON YOUR MEDICATION LIST TO YOU DURING YOUR ADMISSION [place]!       Special Instructions: Bring a copy of your healthcare power of attorney and living will documents the day of surgery if you haven't scanned them before.              Please read over the following fact sheets you were given: IF YOU HAVE QUESTIONS ABOUT YOUR PRE-OP INSTRUCTIONS PLEASE CALL [phone]    [facility] - Preparing for Surgery Before surgery, you can play an important role.  Because skin is not sterile, your skin needs to be as free of germs as possible.  You can reduce the number of germs on  your skin by washing with CHG (chlorahexidine gluconate) soap before surgery.  CHG is an antiseptic cleaner which kills germs and bonds with the skin to continue killing germs even after washing. Please DO NOT use if you have an allergy to CHG or antibacterial soaps.  If your skin becomes reddened/irritated stop using the CHG and inform your nurse when you arrive at Short Stay. You may shave your face/neck. Please follow these instructions carefully:  1.  Shower with CHG Soap the night before surgery and the  morning of Surgery.  2.  If you choose to wash your hair, wash your hair first as usual with your  normal  shampoo.  3.  After you shampoo, rinse your hair and body thoroughly to remove the  shampoo.  4.  Use CHG as you would any other liquid soap.  You can apply chg directly  to the skin and wash                       Gently with a scrungie or clean washcloth.  5.  Apply the CHG Soap to your body ONLY FROM THE NECK DOWN.   Do not use on face/ open                           Wound or open sores. Avoid contact with eyes, ears mouth and genitals (private parts).                       Wash face,  Genitals (private parts) with your normal soap.             6.  Wash thoroughly, paying special attention to the area where your surgery  will be performed.  7.  Thoroughly rinse your body with warm water from the neck down.  8.  DO NOT shower/wash with your normal soap after using and rinsing off  the CHG Soap.             9.  Pat yourself dry with a clean towel.            10.  Wear clean pajamas.            11.  Place clean sheets on your bed the night of your first shower and do not  sleep with pets. Day of Surgery : Do not apply any lotions/deodorants the morning of surgery.  Please wear clean clothes to the hospital/surgery center.  FAILURE TO FOLLOW THESE INSTRUCTIONS MAY RESULT IN THE CANCELLATION OF YOUR SURGERY  PATIENT  SIGNATURE_________________________________   ________________________________________________________________________  Adam Phenix  An incentive spirometer is a tool that can help keep your lungs clear and active. This tool measures how well you are filling your lungs with each breath. Taking long deep breaths may help reverse or decrease the chance of developing breathing (pulmonary) problems (especially infection) following: A long period of time when you are unable to move or be active. BEFORE THE PROCEDURE  If the spirometer includes an indicator to show your best effort, your nurse or respiratory therapist will set it to a desired goal. If possible, sit up straight or lean slightly forward. Try not to slouch. Hold the incentive spirometer in an upright position. INSTRUCTIONS FOR USE  Sit on the edge of your bed if possible, or sit up as far as you can in bed or on a chair. Hold the incentive spirometer in an upright position. Breathe out normally. Place the mouthpiece in your mouth and seal your lips tightly around it. Breathe in slowly and as deeply as possible, raising the piston or the ball toward the top of the column. Hold your breath for 3-5 seconds or for as long as possible. Allow the piston or ball to fall to the bottom of the column. Remove the mouthpiece from your mouth and breathe out normally. Rest for a few seconds and repeat Steps 1 through 7 at least 10 times every 1-2 hours when you are awake. Take your time and take a few normal breaths between deep breaths. The spirometer may include an indicator to show your best effort. Use the indicator as a goal to work toward during each repetition. After each set  of 10 deep breaths, practice coughing to be sure your lungs are clear. If you have an incision (the cut made at the time of surgery), support your incision when coughing by placing a pillow or rolled up towels firmly against it. Once you are able to get out of  bed, walk around indoors and cough well. You may stop using the incentive spirometer when instructed by your caregiver.  RISKS AND COMPLICATIONS Take your time so you do not get dizzy or light-headed. If you are in pain, you may need to take or ask for pain medication before doing incentive spirometry. It is harder to take a deep breath if you are having pain. AFTER USE Rest and breathe slowly and easily. It can be helpful to keep track of a log of your progress. Your caregiver can provide you with a simple table to help with this. If you are using the spirometer at home, follow these instructions: [place] IF:  You are having difficultly using the spirometer. You have trouble using the spirometer as often as instructed. Your pain medication is not giving enough relief while using the spirometer. You develop fever of 100.5 F (38.1 C) or higher. SEEK IMMEDIATE MEDICAL CARE IF:  You cough up bloody sputum that had not been present before. You develop fever of 102 F (38.9 C) or greater. You develop worsening pain at or near the incision site. MAKE SURE YOU:  Understand these instructions. Will watch your condition. Will get help right away if you are not doing well or get worse. Document Released: 05/21/2006 Document Revised: 04/02/2011 Document Reviewed: 07/22/2006 [facility] Patient Information 2014 [place], [place].   ________________________________________________________________________

## 2022-03-23 ENCOUNTER — Encounter (HOSPITAL_COMMUNITY)
Admission: RE | Admit: 2022-03-23 | Discharge: 2022-03-23 | Disposition: A | Discharge status: Home / Self Care | Payer: PPO | Source: Ambulatory Visit | Attending: Orthopedic Surgery | Admitting: Orthopedic Surgery

## 2022-03-23 ENCOUNTER — Encounter (HOSPITAL_COMMUNITY): Payer: Self-pay

## 2022-03-23 ENCOUNTER — Other Ambulatory Visit: Payer: Self-pay

## 2022-03-23 DIAGNOSIS — Z01812 Encounter for preprocedural laboratory examination: Secondary | ICD-10-CM | POA: Diagnosis not present

## 2022-03-23 DIAGNOSIS — I2510 Atherosclerotic heart disease of native coronary artery without angina pectoris: Secondary | ICD-10-CM | POA: Insufficient documentation

## 2022-03-23 DIAGNOSIS — Z955 Presence of coronary angioplasty implant and graft: Secondary | ICD-10-CM | POA: Insufficient documentation

## 2022-03-23 DIAGNOSIS — G8929 Other chronic pain: Secondary | ICD-10-CM | POA: Insufficient documentation

## 2022-03-23 DIAGNOSIS — I071 Rheumatic tricuspid insufficiency: Secondary | ICD-10-CM | POA: Diagnosis not present

## 2022-03-23 DIAGNOSIS — I10 Essential (primary) hypertension: Secondary | ICD-10-CM | POA: Diagnosis not present

## 2022-03-23 DIAGNOSIS — M1711 Unilateral primary osteoarthritis, right knee: Secondary | ICD-10-CM | POA: Insufficient documentation

## 2022-03-23 DIAGNOSIS — Z7902 Long term (current) use of antithrombotics/antiplatelets: Secondary | ICD-10-CM | POA: Insufficient documentation

## 2022-03-23 DIAGNOSIS — M25561 Pain in right knee: Secondary | ICD-10-CM | POA: Diagnosis not present

## 2022-03-23 DIAGNOSIS — Z01818 Encounter for other preprocedural examination: Secondary | ICD-10-CM

## 2022-03-23 LAB — CBC WITH DIFFERENTIAL/PLATELET
Abs Immature Granulocytes: 0.03 10*3/uL (ref 0.00–0.07)
Basophils Absolute: 0 10*3/uL (ref 0.0–0.1)
Basophils Relative: 1 %
Eosinophils Absolute: 0.1 10*3/uL (ref 0.0–0.5)
Eosinophils Relative: 2 %
HCT: 43.7 % (ref 39.0–52.0)
Hemoglobin: 14.8 g/dL (ref 13.0–17.0)
Immature Granulocytes: 0 %
Lymphocytes Relative: 39 %
Lymphs Abs: 2.7 10*3/uL (ref 0.7–4.0)
MCH: 34 pg (ref 26.0–34.0)
MCHC: 33.9 g/dL (ref 30.0–36.0)
MCV: 100.5 fL — ABNORMAL HIGH (ref 80.0–100.0)
Monocytes Absolute: 0.6 10*3/uL (ref 0.1–1.0)
Monocytes Relative: 9 %
Neutro Abs: 3.5 10*3/uL (ref 1.7–7.7)
Neutrophils Relative %: 49 %
Platelets: 167 10*3/uL (ref 150–400)
RBC: 4.35 MIL/uL (ref 4.22–5.81)
RDW: 12 % (ref 11.5–15.5)
WBC: 7.1 10*3/uL (ref 4.0–10.5)
nRBC: 0 % (ref 0.0–0.2)

## 2022-03-23 LAB — COMPREHENSIVE METABOLIC PANEL
ALT: 15 U/L (ref 0–44)
AST: 20 U/L (ref 15–41)
Albumin: 3.6 g/dL (ref 3.5–5.0)
Alkaline Phosphatase: 58 U/L (ref 38–126)
Anion gap: 8 (ref 5–15)
BUN: 18 mg/dL (ref 8–23)
CO2: 24 mmol/L (ref 22–32)
Calcium: 8.5 mg/dL — ABNORMAL LOW (ref 8.9–10.3)
Chloride: 107 mmol/L (ref 98–111)
Creatinine, Ser: 1.12 mg/dL (ref 0.61–1.24)
GFR, Estimated: >60 mL/min (ref >60)
Glucose, Bld: 109 mg/dL — ABNORMAL HIGH (ref 70–99)
Potassium: 3.6 mmol/L (ref 3.5–5.1)
Sodium: 139 mmol/L (ref 135–145)
Total Bilirubin: 0.6 mg/dL (ref 0.3–1.2)
Total Protein: 6.8 g/dL (ref 6.5–8.1)

## 2022-03-23 LAB — SURGICAL PCR SCREEN
MRSA, PCR: NEGATIVE
Staphylococcus aureus: NEGATIVE

## 2022-03-23 NOTE — Progress Notes (Signed)
Anesthesia Chart Review   Case: [id number] Date/Time: 03/26/22 0910   Procedure: TOTAL KNEE ARTHROPLASTY (Right: Knee)   Anesthesia type: Spinal   Pre-op diagnosis: Osteoarthritis of right knee M17.11   Location: [facility] ROOM 07 / [facility]   Surgeons: Vickey Huger, MD       DISCUSSION:81 y.o. HTN, CAD (PCI to left circumflex 04/15/2007 and subsequent PCI and stent mid  left circumflex and OM1 February 2019 ), right knee OA scheduled for above procedure 03/26/2022 with Dr. Ronnie Derby.   Pt last seen by cardiology 11/01/2021. Per OV note, "From a cardiology perspective he is optimized for his planned intermediate risk orthopedic elective surgery he has chronic stable CAD and will stop his aspirin and clopidogrel 1 week preoperatively I would like to see him resume aspirin about 48 hours after surgery and clopidogrel when safe from a bleeding perspective. He should go to a monitored bed postoperative day 1 EKG the next morning after surgery he has had asymptomatic PVCs in the past. Any medical cardiology problems contact heart care"  Pt reports last dose of Plavix AM of 03/20/2022.   Anticipate pt can proceed with planned procedure barring acute status change.   VS: BP (!) 175/91   Pulse 70   Temp 36.6 C (Oral)   Resp 18   Ht '6\' 3"'$  (1.905 m)   Wt 94.3 kg   SpO2 99%   BMI 26.00 kg/m   PROVIDERS: Helen Hashimoto., MD is PCP   Shirlee More, MD is Cardiologist  LABS: Labs reviewed: Acceptable for surgery. (all labs ordered are listed, but only abnormal results are displayed)  Labs Reviewed  SURGICAL PCR SCREEN  CBC WITH DIFFERENTIAL/PLATELET  COMPREHENSIVE METABOLIC PANEL     IMAGES:   EKG:   CV: Echo 03/06/2017 - Left ventricle: The cavity size was normal. Wall thickness was    increased in a pattern of mild LVH. Systolic function was normal.    The estimated ejection fraction was in the range of 50% to 55%.    Wall motion was normal; there were no regional wall motion     abnormalities. Doppler parameters are consistent with abnormal    left ventricular relaxation (grade 1 diastolic dysfunction).  - Aortic valve: There was trivial regurgitation.  - Right atrium: The atrium was moderately dilated.  - Tricuspid valve: There was mild-moderate regurgitation.  - Pulmonary arteries: Systolic pressure was moderately increased.    PA peak pressure: 40 mm Hg (S).  Past Medical History:  Diagnosis Date   Acute myocardial infarction, subendocardial infarction, episode of care unspecified    Arthritis    "left pinky" (03/04/2017)   Cerebral vascular malformation    PT'S NEUROLOGIST IS DR. 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 s/p stent to Cx 2009. b. recent NSTEMI s/p DES to mLCx and m/dRCA.   Coronary artery disease involving native coronary artery of native heart with unstable angina pectoris ([place])    Essential hypertension 03/06/2017   Gallstones 08/07/2012   GERD (gastroesophageal reflux disease)    Headache(784.0)    POSSIBLE TRIGEMINAL NEURALGIA    History of MI (myocardial infarction)    Hyperlipidemia    Hypertension    Mass of right kidney    FOUND ON WORK UP FOR ABD PAIN   Murmur    Myocardial infarction [facility]) 04-14-07   NSTEMI (non-ST elevated myocardial infarction) ([place]) 03/04/2017   /  notes 03/04/2017   Prostate cancer ([place])    PVC's (premature ventricular contractions)    PVCs (premature ventricular contractions) [id number]   Umbilical hernia    Unstable angina ([place]) 03/22/2017    Past Surgical History:  Procedure Laterality Date   CARDIAC CATHETERIZATION  03/04/2017   CHOLECYSTECTOMY N/A 09/19/2012   Procedure: ROBOTIC ASSISTED LAPAROSCOPIC CHOLECYSTECTOMY WITH UMBILICAL HERNIA REPAIR;  Surgeon: Leighton Ruff, MD;  Location: [facility];  Service: General;  Laterality: N/A;   COLONOSCOPY  07/08/2013   Significant rectal polyp status post  polypectomy. Mild sigmoid diverticulosis   CORONARY ANGIOPLASTY WITH STENT PLACEMENT  04/14/07   CORONARY STENT INTERVENTION N/A 03/05/2017   Procedure: CORONARY STENT INTERVENTION;  Surgeon: Martinique, Peter M, MD;  Location: [place] [facility];  Service: Cardiovascular;  Laterality: N/A;   FLEXIBLE SIGMOIDOSCOPY  09/30/2013   Rectal scar at the area of previous polypectomy- no evidence of recurrance/residual polyp. Mild sigmoid diverticulosis   HERNIA REPAIR  05/30/2010   JOINT REPLACEMENT     LEFT HEART CATH AND CORONARY ANGIOGRAPHY N/A 03/04/2017   Procedure: LEFT HEART CATH AND CORONARY ANGIOGRAPHY;  Surgeon: Belva Crome, MD;  Location: [place] [facility];  Service: Cardiovascular;  Laterality: N/A;   LEFT HEART CATH AND CORONARY ANGIOGRAPHY N/A 03/25/2017   Procedure: LEFT HEART CATH AND CORONARY ANGIOGRAPHY;  Surgeon: Lorretta Harp, MD;  Location: [place] [facility];  Service: Cardiovascular;  Laterality: N/A;   POLYPECTOMY     PROSTATE SURGERY     RADIOACTIVE SEED IMPLANT FOR PROSTATE CANCER  2 22 2006   REPLACEMENT TOTAL KNEE Left 05/30/2010   ROBOTIC ASSITED PARTIAL NEPHRECTOMY Right 09/19/2012   Procedure: ROBOTIC ASSITED PARTIAL NEPHRECTOMY;  Surgeon: Molli Hazard, MD;  Location[facility];  Service: Urology;  Laterality: Right;   SHOULDER OPEN ROTATOR CUFF REPAIR Left 03/30/2008   TOTAL HIP ARTHROPLASTY Right 01/2001   TOTAL HIP REVISION  07/2008   acetabular revision   ULTRASOUND GUIDANCE FOR VASCULAR ACCESS  03/05/2017   Procedure: Ultrasound Guidance For Vascular Access;  Surgeon: Martinique, Peter M, MD;  Location: [place] [facility];  Service: Cardiovascular;;    MEDICATIONS:  amLODipine (NORVASC) 5 MG tablet   aspirin 81 MG tablet   clopidogrel (PLAVIX) 75 MG tablet   Evolocumab 140 MG/ML SOSY   metoprolol tartrate (LOPRESSOR) 25 MG tablet   nitroGLYCERIN (NITROSTAT) 0.4 MG SL tablet   pantoprazole (PROTONIX) 40 MG tablet   No current facility-administered  medications for this encounter.     Konrad Felix Ward, PA-C [facility] Pre-Surgical Testing [phone]

## 2022-03-23 NOTE — Progress Notes (Signed)
Anesthesia note:  PCP - Dr. Hale Bogus Cardiologist -Dr. 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 Pt has no SOB with activities.   Patient verbalized understanding of instructions that were given to them at the PAT appointment. Patient was also instructed that they will need to review over the PAT instructions again at home before surgery.yes his wife was with him

## 2022-03-26 ENCOUNTER — Encounter (HOSPITAL_COMMUNITY)
Admission: RE | Disposition: A | Discharge status: Home / Self Care | Payer: Self-pay | Source: Home / Self Care | Attending: Orthopedic Surgery

## 2022-03-26 ENCOUNTER — Observation Stay (HOSPITAL_COMMUNITY)
Admission: RE | Admit: 2022-03-26 | Discharge: 2022-03-27 | Disposition: A | Discharge status: Home / Self Care | Payer: PPO | Attending: Orthopedic Surgery | Admitting: Orthopedic Surgery

## 2022-03-26 ENCOUNTER — Encounter (HOSPITAL_COMMUNITY): Payer: Self-pay | Admitting: Orthopedic Surgery

## 2022-03-26 ENCOUNTER — Other Ambulatory Visit: Payer: Self-pay

## 2022-03-26 ENCOUNTER — Ambulatory Visit (HOSPITAL_BASED_OUTPATIENT_CLINIC_OR_DEPARTMENT_OTHER): Payer: PPO | Admitting: Certified Registered Nurse Anesthetist

## 2022-03-26 ENCOUNTER — Ambulatory Visit (HOSPITAL_COMMUNITY): Payer: PPO | Admitting: Certified Registered Nurse Anesthetist

## 2022-03-26 DIAGNOSIS — I252 Old myocardial infarction: Secondary | ICD-10-CM | POA: Diagnosis not present

## 2022-03-26 DIAGNOSIS — M1711 Unilateral primary osteoarthritis, right knee: Secondary | ICD-10-CM

## 2022-03-26 DIAGNOSIS — G8918 Other acute postprocedural pain: Secondary | ICD-10-CM | POA: Diagnosis not present

## 2022-03-26 DIAGNOSIS — Z7902 Long term (current) use of antithrombotics/antiplatelets: Secondary | ICD-10-CM | POA: Diagnosis not present

## 2022-03-26 DIAGNOSIS — Z01818 Encounter for other preprocedural examination: Secondary | ICD-10-CM

## 2022-03-26 DIAGNOSIS — Z79899 Other long term (current) drug therapy: Secondary | ICD-10-CM | POA: Insufficient documentation

## 2022-03-26 DIAGNOSIS — Z8546 Personal history of malignant neoplasm of prostate: Secondary | ICD-10-CM | POA: Diagnosis not present

## 2022-03-26 DIAGNOSIS — Z96652 Presence of left artificial knee joint: Secondary | ICD-10-CM | POA: Diagnosis not present

## 2022-03-26 DIAGNOSIS — Z7982 Long term (current) use of aspirin: Secondary | ICD-10-CM | POA: Insufficient documentation

## 2022-03-26 DIAGNOSIS — Z96659 Presence of unspecified artificial knee joint: Secondary | ICD-10-CM

## 2022-03-26 DIAGNOSIS — I10 Essential (primary) hypertension: Secondary | ICD-10-CM | POA: Diagnosis not present

## 2022-03-26 DIAGNOSIS — I2510 Atherosclerotic heart disease of native coronary artery without angina pectoris: Secondary | ICD-10-CM

## 2022-03-26 DIAGNOSIS — Z96641 Presence of right artificial hip joint: Secondary | ICD-10-CM | POA: Insufficient documentation

## 2022-03-26 DIAGNOSIS — Z87891 Personal history of nicotine dependence: Secondary | ICD-10-CM

## 2022-03-26 DIAGNOSIS — Z955 Presence of coronary angioplasty implant and graft: Secondary | ICD-10-CM | POA: Diagnosis not present

## 2022-03-26 HISTORY — DX: Presence of unspecified artificial knee joint: Z96.659

## 2022-03-26 HISTORY — PX: TOTAL KNEE ARTHROPLASTY: SHX125

## 2022-03-26 SURGERY — ARTHROPLASTY, KNEE, TOTAL
Anesthesia: Monitor Anesthesia Care | Site: Knee | Laterality: Right

## 2022-03-26 MED ORDER — ONDANSETRON HCL 4 MG/2ML IJ SOLN: 4.0000 mg | Freq: Four times a day (QID) | INTRAMUSCULAR | Status: DC | PRN

## 2022-03-26 MED ORDER — NITROGLYCERIN 0.4 MG SL SUBL: 0.4000 mg | SUBLINGUAL_TABLET | SUBLINGUAL | Status: DC | PRN

## 2022-03-26 MED ORDER — PROPOFOL 500 MG/50ML IV EMUL
INTRAVENOUS | Status: DC | PRN
  Administered 2022-03-26: 50 ug/kg/min via INTRAVENOUS

## 2022-03-26 MED ORDER — AMLODIPINE BESYLATE 5 MG PO TABS
5.0000 mg | ORAL_TABLET | Freq: Every morning | ORAL | Status: DC
  Administered 2022-03-27: 5 mg via ORAL
  Filled 2022-03-26: qty 1

## 2022-03-26 MED ORDER — EPHEDRINE SULFATE-NACL 50-0.9 MG/10ML-% IV SOSY
PREFILLED_SYRINGE | INTRAVENOUS | Status: DC | PRN
  Administered 2022-03-26: 7.5 mg via INTRAVENOUS

## 2022-03-26 MED ORDER — PANTOPRAZOLE SODIUM 40 MG PO TBEC
40.0000 mg | DELAYED_RELEASE_TABLET | Freq: Every day | ORAL | Status: DC
  Administered 2022-03-27: 40 mg via ORAL
  Filled 2022-03-26: qty 1

## 2022-03-26 MED ORDER — OXYCODONE HCL 5 MG/5ML PO SOLN: 5.0000 mg | Freq: Once | ORAL | Status: DC | PRN

## 2022-03-26 MED ORDER — ALUM & MAG HYDROXIDE-SIMETH 200-200-20 MG/5ML PO SUSP: 30.0000 mL | ORAL | Status: DC | PRN

## 2022-03-26 MED ORDER — ORAL CARE MOUTH RINSE: 15.0000 mL | Freq: Once | OROMUCOSAL | Status: AC

## 2022-03-26 MED ORDER — SODIUM CHLORIDE (PF) 0.9 % IJ SOLN
INTRAMUSCULAR | Status: AC
  Filled 2022-03-26: qty 20

## 2022-03-26 MED ORDER — BUPIVACAINE IN DEXTROSE 0.75-8.25 % IT SOLN
INTRATHECAL | Status: DC | PRN
  Administered 2022-03-26: 1.6 mL via INTRATHECAL

## 2022-03-26 MED ORDER — LACTATED RINGERS IV SOLN: INTRAVENOUS | Status: DC

## 2022-03-26 MED ORDER — ROPIVACAINE HCL 5 MG/ML IJ SOLN
INTRAMUSCULAR | Status: DC | PRN
  Administered 2022-03-26: 30 mL via PERINEURAL

## 2022-03-26 MED ORDER — FLEET ENEMA 7-19 GM/118ML RE ENEM: 1.0000 | ENEMA | Freq: Once | RECTAL | Status: DC | PRN

## 2022-03-26 MED ORDER — TRANEXAMIC ACID-NACL 1000-0.7 MG/100ML-% IV SOLN
1000.0000 mg | INTRAVENOUS | Status: AC
  Administered 2022-03-26: 1000 mg via INTRAVENOUS
  Filled 2022-03-26: qty 100

## 2022-03-26 MED ORDER — BUPIVACAINE-EPINEPHRINE 0.25% -1:200000 IJ SOLN
INTRAMUSCULAR | Status: DC | PRN
  Administered 2022-03-26: 30 mL

## 2022-03-26 MED ORDER — CHLORHEXIDINE GLUCONATE 0.12 % MT SOLN
15.0000 mL | Freq: Once | OROMUCOSAL | Status: AC
  Administered 2022-03-26: 15 mL via OROMUCOSAL

## 2022-03-26 MED ORDER — BUPIVACAINE LIPOSOME 1.3 % IJ SUSP
INTRAMUSCULAR | Status: AC
  Filled 2022-03-26: qty 20

## 2022-03-26 MED ORDER — MENTHOL 3 MG MT LOZG: 1.0000 | LOZENGE | OROMUCOSAL | Status: DC | PRN

## 2022-03-26 MED ORDER — PHENYLEPHRINE HCL-NACL 20-0.9 MG/250ML-% IV SOLN
INTRAVENOUS | Status: DC | PRN
  Administered 2022-03-26: 25 ug/min via INTRAVENOUS

## 2022-03-26 MED ORDER — ONDANSETRON HCL 4 MG PO TABS: 4.0000 mg | ORAL_TABLET | Freq: Four times a day (QID) | ORAL | Status: DC | PRN

## 2022-03-26 MED ORDER — FENTANYL CITRATE PF 50 MCG/ML IJ SOSY
PREFILLED_SYRINGE | INTRAMUSCULAR | Status: AC
  Administered 2022-03-26: 50 ug via INTRAVENOUS
  Filled 2022-03-26: qty 2

## 2022-03-26 MED ORDER — POVIDONE-IODINE 10 % EX SWAB: 2.0000 | Freq: Once | CUTANEOUS | Status: DC

## 2022-03-26 MED ORDER — ASPIRIN 81 MG PO CHEW
81.0000 mg | CHEWABLE_TABLET | Freq: Every day | ORAL | Status: DC
  Administered 2022-03-27: 81 mg via ORAL
  Filled 2022-03-26: qty 1

## 2022-03-26 MED ORDER — CLOPIDOGREL BISULFATE 75 MG PO TABS
75.0000 mg | ORAL_TABLET | Freq: Every day | ORAL | Status: DC
  Administered 2022-03-27: 75 mg via ORAL
  Filled 2022-03-26: qty 1

## 2022-03-26 MED ORDER — BUPIVACAINE-EPINEPHRINE (PF) 0.25% -1:200000 IJ SOLN
INTRAMUSCULAR | Status: AC
  Filled 2022-03-26: qty 30

## 2022-03-26 MED ORDER — CEFAZOLIN SODIUM-DEXTROSE 2-4 GM/100ML-% IV SOLN
2.0000 g | INTRAVENOUS | Status: AC
  Administered 2022-03-26: 2 g via INTRAVENOUS
  Filled 2022-03-26: qty 100

## 2022-03-26 MED ORDER — ZOLPIDEM TARTRATE 5 MG PO TABS: 5.0000 mg | ORAL_TABLET | Freq: Every evening | ORAL | Status: DC | PRN

## 2022-03-26 MED ORDER — SODIUM CHLORIDE 0.9% FLUSH
INTRAVENOUS | Status: DC | PRN
  Administered 2022-03-26: 20 mL

## 2022-03-26 MED ORDER — PANTOPRAZOLE SODIUM 40 MG PO TBEC: 40.0000 mg | DELAYED_RELEASE_TABLET | Freq: Every day | ORAL | Status: DC

## 2022-03-26 MED ORDER — METOCLOPRAMIDE HCL 5 MG PO TABS: 5.0000 mg | ORAL_TABLET | Freq: Three times a day (TID) | ORAL | Status: DC | PRN

## 2022-03-26 MED ORDER — HYDROMORPHONE HCL 1 MG/ML IJ SOLN: 0.5000 mg | INTRAMUSCULAR | Status: DC | PRN

## 2022-03-26 MED ORDER — EPHEDRINE 5 MG/ML INJ
INTRAVENOUS | Status: AC
  Filled 2022-03-26: qty 15

## 2022-03-26 MED ORDER — DEXAMETHASONE SODIUM PHOSPHATE 10 MG/ML IJ SOLN
INTRAMUSCULAR | Status: DC | PRN
  Administered 2022-03-26: 10 mg

## 2022-03-26 MED ORDER — PHENYLEPHRINE 80 MCG/ML (10ML) SYRINGE FOR IV PUSH (FOR BLOOD PRESSURE SUPPORT)
PREFILLED_SYRINGE | INTRAVENOUS | Status: DC | PRN
  Administered 2022-03-26: 80 ug via INTRAVENOUS

## 2022-03-26 MED ORDER — METOPROLOL TARTRATE 25 MG PO TABS
25.0000 mg | ORAL_TABLET | Freq: Two times a day (BID) | ORAL | Status: DC
  Administered 2022-03-26 – 2022-03-27 (×2): 25 mg via ORAL
  Filled 2022-03-26 (×2): qty 1

## 2022-03-26 MED ORDER — PROPOFOL 10 MG/ML IV BOLUS
INTRAVENOUS | Status: DC | PRN
  Administered 2022-03-26: 20 mg via INTRAVENOUS
  Administered 2022-03-26: 15 mg via INTRAVENOUS

## 2022-03-26 MED ORDER — PHENOL 1.4 % MT LIQD: 1.0000 | OROMUCOSAL | Status: DC | PRN

## 2022-03-26 MED ORDER — GABAPENTIN 300 MG PO CAPS
300.0000 mg | ORAL_CAPSULE | Freq: Once | ORAL | Status: AC
  Administered 2022-03-26: 300 mg via ORAL
  Filled 2022-03-26: qty 1

## 2022-03-26 MED ORDER — 0.9 % SODIUM CHLORIDE (POUR BTL) OPTIME
TOPICAL | Status: DC | PRN
  Administered 2022-03-26: 1000 mL

## 2022-03-26 MED ORDER — DIPHENHYDRAMINE HCL 12.5 MG/5ML PO ELIX: 12.5000 mg | ORAL_SOLUTION | ORAL | Status: DC | PRN

## 2022-03-26 MED ORDER — ONDANSETRON HCL 4 MG/2ML IJ SOLN
INTRAMUSCULAR | Status: DC | PRN
  Administered 2022-03-26: 4 mg via INTRAVENOUS

## 2022-03-26 MED ORDER — DEXAMETHASONE SODIUM PHOSPHATE 10 MG/ML IJ SOLN
10.0000 mg | Freq: Once | INTRAMUSCULAR | Status: AC
  Administered 2022-03-27: 10 mg via INTRAVENOUS
  Filled 2022-03-26: qty 1

## 2022-03-26 MED ORDER — ACETAMINOPHEN 500 MG PO TABS
1000.0000 mg | ORAL_TABLET | Freq: Four times a day (QID) | ORAL | Status: AC
  Administered 2022-03-26 (×3): 1000 mg via ORAL
  Filled 2022-03-26 (×4): qty 2

## 2022-03-26 MED ORDER — WATER FOR IRRIGATION, STERILE IR SOLN
Status: DC | PRN
  Administered 2022-03-26: 2000 mL

## 2022-03-26 MED ORDER — METOCLOPRAMIDE HCL 5 MG/ML IJ SOLN: 5.0000 mg | Freq: Three times a day (TID) | INTRAMUSCULAR | Status: DC | PRN

## 2022-03-26 MED ORDER — BUPIVACAINE LIPOSOME 1.3 % IJ SUSP: 20.0000 mL | Freq: Once | INTRAMUSCULAR | Status: DC

## 2022-03-26 MED ORDER — OXYCODONE HCL 5 MG PO TABS: 5.0000 mg | ORAL_TABLET | Freq: Once | ORAL | Status: DC | PRN

## 2022-03-26 MED ORDER — BUPIVACAINE LIPOSOME 1.3 % IJ SUSP
INTRAMUSCULAR | Status: DC | PRN
  Administered 2022-03-26: 20 mL

## 2022-03-26 MED ORDER — SENNOSIDES-DOCUSATE SODIUM 8.6-50 MG PO TABS: 1.0000 | ORAL_TABLET | Freq: Every evening | ORAL | Status: DC | PRN

## 2022-03-26 MED ORDER — FERROUS SULFATE 325 (65 FE) MG PO TABS
325.0000 mg | ORAL_TABLET | Freq: Three times a day (TID) | ORAL | Status: DC
  Administered 2022-03-26 – 2022-03-27 (×2): 325 mg via ORAL
  Filled 2022-03-26 (×2): qty 1

## 2022-03-26 MED ORDER — SODIUM CHLORIDE 0.9 % IV SOLN: INTRAVENOUS | Status: DC

## 2022-03-26 MED ORDER — SODIUM CHLORIDE 0.9 % IR SOLN
Status: DC | PRN
  Administered 2022-03-26: 1000 mL

## 2022-03-26 MED ORDER — BISACODYL 5 MG PO TBEC: 5.0000 mg | DELAYED_RELEASE_TABLET | Freq: Every day | ORAL | Status: DC | PRN

## 2022-03-26 MED ORDER — ONDANSETRON HCL 4 MG/2ML IJ SOLN: 4.0000 mg | Freq: Once | INTRAMUSCULAR | Status: DC | PRN

## 2022-03-26 MED ORDER — HYDROMORPHONE HCL 1 MG/ML IJ SOLN: 0.2500 mg | INTRAMUSCULAR | Status: DC | PRN

## 2022-03-26 MED ORDER — OXYCODONE HCL 5 MG PO TABS
5.0000 mg | ORAL_TABLET | ORAL | Status: DC | PRN
  Administered 2022-03-27 (×2): 10 mg via ORAL
  Filled 2022-03-26 (×2): qty 2

## 2022-03-26 MED ORDER — DOCUSATE SODIUM 100 MG PO CAPS
100.0000 mg | ORAL_CAPSULE | Freq: Two times a day (BID) | ORAL | Status: DC
  Administered 2022-03-26 – 2022-03-27 (×2): 100 mg via ORAL
  Filled 2022-03-26 (×2): qty 1

## 2022-03-26 MED ORDER — FENTANYL CITRATE PF 50 MCG/ML IJ SOSY: 50.0000 ug | PREFILLED_SYRINGE | INTRAMUSCULAR | Status: DC

## 2022-03-26 MED ORDER — DEXAMETHASONE SODIUM PHOSPHATE 10 MG/ML IJ SOLN: 8.0000 mg | Freq: Once | INTRAMUSCULAR | Status: DC

## 2022-03-26 MED ORDER — ACETAMINOPHEN 500 MG PO TABS
1000.0000 mg | ORAL_TABLET | Freq: Once | ORAL | Status: AC
  Administered 2022-03-26: 1000 mg via ORAL
  Filled 2022-03-26: qty 2

## 2022-03-26 SURGICAL SUPPLY — 58 items
ARTISURF 14M PLYR 10-12GH KNEE (Knees) IMPLANT
BAG COUNTER SPONGE SURGICOUNT (BAG) IMPLANT
BAG SPEC THK2 15X12 ZIP CLS (MISCELLANEOUS) ×1
BAG SPNG CNTER NS LX DISP (BAG)
BAG ZIPLOCK 12X15 (MISCELLANEOUS) ×1 IMPLANT
BLADE SAGITTAL 13X1.27X60 (BLADE) ×1 IMPLANT
BLADE SAW SGTL 18X1.27X75 (BLADE) ×1 IMPLANT
BLADE SURG 15 STRL LF DISP TIS (BLADE) IMPLANT
BLADE SURG 15 STRL SS (BLADE) ×1
BNDG CMPR 5X62 HK CLSR LF (GAUZE/BANDAGES/DRESSINGS) ×1
BNDG ELASTIC 6INX 5YD STR LF (GAUZE/BANDAGES/DRESSINGS) ×1 IMPLANT
BNDG ELASTIC 6X5.8 VLCR STR LF (GAUZE/BANDAGES/DRESSINGS) IMPLANT
BOWL SMART MIX CTS (DISPOSABLE) ×1 IMPLANT
BSPLAT TIB 5D G CMNT STM RT (Knees) ×1 IMPLANT
CEMENT BONE R 1X40 (Cement) ×2 IMPLANT
CLSR STERI-STRIP ANTIMIC 1/2X4 (GAUZE/BANDAGES/DRESSINGS) IMPLANT
COVER SURGICAL LIGHT HANDLE (MISCELLANEOUS) ×1 IMPLANT
CUFF TOURN SGL QUICK 34 (TOURNIQUET CUFF) ×1
CUFF TRNQT CYL 34X4.125X (TOURNIQUET CUFF) ×1 IMPLANT
DRAPE INCISE IOBAN 66X45 STRL (DRAPES) ×2 IMPLANT
DRAPE U-SHAPE 47X51 STRL (DRAPES) ×1 IMPLANT
DRESSING AQUACEL AG SP 3.5X10 (GAUZE/BANDAGES/DRESSINGS) IMPLANT
DRSG AQUACEL AG ADV 3.5X10 (GAUZE/BANDAGES/DRESSINGS) ×1 IMPLANT
DRSG AQUACEL AG SP 3.5X10 (GAUZE/BANDAGES/DRESSINGS) ×1
DURAPREP 26ML APPLICATOR (WOUND CARE) ×2 IMPLANT
ELECT REM PT RETURN 15FT ADLT (MISCELLANEOUS) ×1 IMPLANT
FEMUR  CMT CCR STD SZ10 R KNEE (Knees) ×1 IMPLANT
FEMUR CMT CCR STD SZ10 R KNEE (Knees) ×1 IMPLANT
FEMUR CMTD CCR STD SZ10 R KNEE (Knees) IMPLANT
GLOVE BIOGEL M 7.0 STRL (GLOVE) IMPLANT
GLOVE BIOGEL PI IND STRL 7.5 (GLOVE) IMPLANT
GLOVE BIOGEL PI IND STRL 8.5 (GLOVE) ×1 IMPLANT
GLOVE SURG ORTHO 8.0 STRL STRW (GLOVE) ×2 IMPLANT
GOWN STRL REUS W/ TWL XL LVL3 (GOWN DISPOSABLE) ×2 IMPLANT
GOWN STRL REUS W/TWL XL LVL3 (GOWN DISPOSABLE) ×2
HANDPIECE INTERPULSE COAX TIP (DISPOSABLE) ×1
HOLDER FOLEY CATH W/STRAP (MISCELLANEOUS) ×1 IMPLANT
HOOD PEEL AWAY T7 (MISCELLANEOUS) ×3 IMPLANT
KIT TURNOVER KIT A (KITS) IMPLANT
MANIFOLD NEPTUNE II (INSTRUMENTS) ×1 IMPLANT
NS IRRIG 1000ML POUR BTL (IV SOLUTION) ×1 IMPLANT
PACK TOTAL KNEE CUSTOM (KITS) ×1 IMPLANT
PROTECTOR NERVE ULNAR (MISCELLANEOUS) ×1 IMPLANT
SET HNDPC FAN SPRY TIP SCT (DISPOSABLE) ×1 IMPLANT
SPIKE FLUID TRANSFER (MISCELLANEOUS) ×2 IMPLANT
STEM POLY PAT PLY 41M KNEE (Knees) IMPLANT
STEM TIBIA 5 DEG SZ G R KNEE (Knees) IMPLANT
STRIP CLOSURE SKIN 1/2X4 (GAUZE/BANDAGES/DRESSINGS) ×1 IMPLANT
SUT BONE WAX W31G (SUTURE) ×1 IMPLANT
SUT MNCRL AB 3-0 PS2 18 (SUTURE) ×1 IMPLANT
SUT STRATAFIX 0 PDS 27 VIOLET (SUTURE) ×1
SUT STRATAFIX 1PDS 45CM VIOLET (SUTURE) ×1 IMPLANT
SUT VIC AB 1 CT1 36 (SUTURE) ×1 IMPLANT
SUTURE STRATFX 0 PDS 27 VIOLET (SUTURE) ×1 IMPLANT
TIBIA STEM 5 DEG SZ G R KNEE (Knees) ×1 IMPLANT
TRAY FOLEY MTR SLVR 16FR STAT (SET/KITS/TRAYS/PACK) ×1 IMPLANT
WATER STERILE IRR 1000ML POUR (IV SOLUTION) ×2 IMPLANT
WRAP KNEE MAXI GEL POST OP (GAUZE/BANDAGES/DRESSINGS) ×1 IMPLANT

## 2022-03-26 NOTE — Transfer of Care (Signed)
Immediate Anesthesia Transfer of Care Note  Patient: [name] [name]  Procedure(s) Performed: TOTAL KNEE ARTHROPLASTY (Right: Knee)  Patient Location: PACU  Anesthesia Type:Spinal and MAC combined with regional for post-op pain  Level of Consciousness: awake and patient cooperative  Airway & Oxygen Therapy: Patient Spontanous Breathing and Patient connected to face mask oxygen  Post-op Assessment: Report given to RN and Post -op Vital signs reviewed and stable  Post vital signs: Reviewed and stable  Last Vitals:  Vitals Value Taken Time  BP 101/68 03/26/22 1104  Temp    Pulse 65 03/26/22 1105  Resp 19 03/26/22 1105  SpO2 97 % 03/26/22 1105  Vitals shown include unvalidated device data.  Last Pain:  Vitals:   03/26/22 0739  TempSrc:   PainSc: 6       Patients Stated Pain Goal: 4 ([id number] [id number])  Complications: No notable events documented.

## 2022-03-26 NOTE — Op Note (Signed)
TOTAL KNEE REPLACEMENT OPERATIVE NOTE:  03/26/2022  11:09 AM  PATIENT:  [name] [name]  82 y.o. male  PRE-OPERATIVE DIAGNOSIS:  Osteoarthritis of right knee M17.11  POST-OPERATIVE DIAGNOSIS:  Osteoarthritis of right knee M17.11  PROCEDURE:  Procedure(s): TOTAL KNEE ARTHROPLASTY  SURGEON:  Surgeon(s): Vickey Huger, MD  PHYSICIAN ASSISTANT: Carlyon Shadow,   ANESTHESIA:   spinal  SPECIMEN: None  COUNTS:  Correct  TOURNIQUET:   Total Tourniquet Time Documented: Thigh (Right) - 43 minutes Total: Thigh (Right) - 43 minutes   DICTATION:  Indication for procedure:    The patient is a 82 y.o. male who has failed conservative treatment for Osteoarthritis of right knee M17.11.  Informed consent was obtained prior to anesthesia. The risks versus benefits of the operation were explain and in a way the patient can, and did, understand.     Description of procedure:     The patient was taken to the operating room and placed under anesthesia.  The patient was positioned in the usual fashion taking care that all body parts were adequately padded and/or protected.  A tourniquet was applied and the leg prepped and draped in the usual sterile fashion.  The extremity was exsanguinated with the esmarch and tourniquet inflated to 250 mmHg.  Pre-operative range of motion was normal.    A midline incision approximately 6-7 inches long was made with a #10 blade.  A new blade was used to make a parapatellar arthrotomy going 2-3 cm into the quadriceps tendon, over the patella, and alongside the medial aspect of the patellar tendon.  A synovectomy was then performed with the #10 blade and forceps. I then elevated the deep MCL off the medial tibial metaphysis subperiosteally around to the semimembranosus attachment.    I everted the patella and used calipers to measure patellar thickness.  I used the reamer to ream down to appropriate thickness to recreate the native thickness.  I then removed excess  bone with the rongeur and sagittal saw.  I used the appropriately sized template and drilled the three lug holes.  I then put the trial in place and measured the thickness with the calipers to ensure recreation of the native thickness.  The trial was then removed and the patella subluxed and the knee brought into flexion.  A homan retractor was place to retract and protect the patella and lateral structures.  A Z-retractor was place medially to protect the medial structures.  The extra-medullary alignment system was used to make cut the tibial articular surface perpendicular to the anamotic axis of the tibia and in 3 degrees of posterior slope.  The cut surface and alignment jig was removed.  I then used the intramedullary alignment guide to make a valgus cut on the distal femur.  I then marked out I then used the anterior referencing sizer and measured the femur to be a size 10.  The 4-In-1 cutting block was screwed into place in external rotation matching the posterior condylar angle, making our cuts perpendicular to the epicondylar axis.  Anterior, posterior and chamfer cuts were made with the sagittal saw.  The cutting block and cut pieces were removed.  A lamina spreader was placed in 90 degrees of flexion.  The ACL, PCL, menisci, and posterior condylar osteophytes were removed.  A 14 mm spacer blocked was found to offer good flexion and extension gap balance after minimal in degree releasing.   The scoop retractor was then placed and the femoral finishing block was pinned in  place.  The small sagittal saw was used as well as the lug drill to finish the femur.  The block and cut surfaces were removed and the medullary canal hole filled with autograft bone from the cut pieces.  The tibia was delivered forward in deep flexion and external rotation.  A size G tray was selected and pinned into place centered on the medial 1/3 of the tibial tubercle.  The reamer and keel was used to prepare the tibia through  the tray.    I then trialed with the size 10 femur, size G tibia, a 14 mm insert and the 41 patella.  I had excellent flexion/extension gap balance, excellent patella tracking.  Flexion was full and beyond 120 degrees; extension was zero.  These components were chosen and the staff opened them to me on the back table while the knee was lavaged copiously and the cement mixed.  The soft tissue was infiltrated with 60cc of exparel 1.3% through a 21 gauge needle.  I cemented in the components and removed all excess cement.  The polyethylene tibial component was snapped into place and the knee placed in extension while cement was hardening.  The capsule was infilltrated with a 60cc exparel/marcaine/saline mixture.   Once the cement was hard, the tourniquet was let down.  Hemostasis was obtained.  The arthrotomy was closed using a #1 stratofix running suture.  The deep soft tissues were closed with #0 vicryls and the subcuticular layer closed with #2-0 vicryl.  The skin was reapproximated and closed with 3.0 Monocryl.  The wound was covered with steristrips, aquacel dressing, and a TED stocking.   The patient was then awakened, extubated, and taken to the recovery room in stable condition.  BLOOD LOSS:  0000000 COMPLICATIONS:  None.  PLAN OF CARE: Admit for overnight observation  PATIENT DISPOSITION:  PACU - hemodynamically stable.   Delay start of Pharmacological VTE agent (>24hrs) due to surgical blood loss or risk of bleeding:  yes  Please fax a copy of this op note to my office at [phone] (please only include page 1 and 2 of the Case Information op note)

## 2022-03-26 NOTE — Anesthesia Procedure Notes (Signed)
Procedure Name: MAC Date/Time: 03/26/2022 9:20 AM  Performed by: West Pugh, CRNAPre-anesthesia Checklist: Patient identified, Emergency Drugs available, Suction available, Patient being monitored and Timeout performed Patient Re-evaluated:Patient Re-evaluated prior to induction Oxygen Delivery Method: Simple face mask Preoxygenation: Pre-oxygenation with 100% oxygen Placement Confirmation: positive ETCO2 Dental Injury: Teeth and Oropharynx as per pre-operative assessment

## 2022-03-26 NOTE — Anesthesia Postprocedure Evaluation (Signed)
Anesthesia Post Note  Patient: [name] [name]  Procedure(s) Performed: TOTAL KNEE ARTHROPLASTY (Right: Knee)     Patient location during evaluation: PACU Anesthesia Type: Regional, MAC and Spinal Level of consciousness: awake and alert and oriented Pain management: pain level controlled Vital Signs Assessment: post-procedure vital signs reviewed and stable Respiratory status: spontaneous breathing, nonlabored ventilation and respiratory function stable Cardiovascular status: blood pressure returned to baseline and stable Postop Assessment: no headache, no backache, spinal receding and no apparent nausea or vomiting Anesthetic complications: no   No notable events documented.  Last Vitals:  Vitals:   03/26/22 1145 03/26/22 1200  BP: 123/79 118/69  Pulse: 75 (!) 59  Resp: 19 14  Temp:    SpO2: 97% 96%    Last Pain:  Vitals:   03/26/22 1130  TempSrc:   PainSc: 0-No pain    LLE Motor Response: Purposeful movement (03/26/22 1200) LLE Sensation: Decreased (03/26/22 1200) RLE Motor Response: Purposeful movement (03/26/22 1200) RLE Sensation: Numbness;Decreased (03/26/22 1200) L Sensory Level: S1-Sole of foot, small toes (03/26/22 1200) R Sensory Level: L3-Anterior knee, lower leg (03/26/22 1200)  Pervis Hocking

## 2022-03-26 NOTE — Anesthesia Procedure Notes (Signed)
Anesthesia Regional Block: Adductor canal block   Pre-Anesthetic Checklist: , timeout performed,  Correct Patient, Correct Site, Correct Laterality,  Correct Procedure, Correct Position, site marked,  Risks and benefits discussed,  Surgical consent,  Pre-op evaluation,  At surgeon's request and post-op pain management  Laterality: Right  Prep: Maximum Sterile Barrier Precautions used, chloraprep       Needles:  Injection technique: Single-shot  Needle Type: Echogenic Stimulator Needle     Needle Length: 9cm  Needle Gauge: 22     Additional Needles:   Procedures:,,,, ultrasound used (permanent image in chart),,    Narrative:  Start time: 03/26/2022 9:00 AM End time: 03/26/2022 9:05 AM Injection made incrementally with aspirations every 5 mL.  Performed by: Personally  Anesthesiologist: Pervis Hocking, DO  Additional Notes: Monitors applied. No increased pain on injection. No increased resistance to injection. Injection made in 5cc increments. Good needle visualization. Patient tolerated procedure well.

## 2022-03-26 NOTE — Plan of Care (Signed)
  Problem: Pain Management: Goal: Pain level will decrease with appropriate interventions Outcome: Progressing   Problem: Activity: Goal: Risk for activity intolerance will decrease Outcome: Progressing   Problem: Safety: Goal: Ability to remain free from injury will improve Outcome: Progressing   

## 2022-03-26 NOTE — Progress Notes (Signed)
Orthopedic Tech Progress Note Patient Details:  [name] [name] 26-Feb-1940 [id number]  CPM Right Knee CPM Right Knee: Off Right Knee Flexion (Degrees): 90 Right Knee Extension (Degrees): 0  Post Interventions Patient Tolerated: Well Instructions Provided: Adjustment of device  [name] 03/26/2022, 3:32 PM

## 2022-03-26 NOTE — Anesthesia Preprocedure Evaluation (Signed)
Anesthesia Evaluation  Patient identified by MRN, date of birth, ID band Patient awake    Reviewed: Allergy & Precautions, H&P , NPO status , Patient's Chart, lab work & pertinent test results, reviewed documented beta blocker date and time   Airway Mallampati: IV  TM Distance: >3 FB Neck ROM: Full    Dental  (+) Partial Upper   Pulmonary former smoker Quit smoking 1980, 30 pack year history  Snores at night, no witnessed apneas, has never had sleep study    Pulmonary exam normal breath sounds clear to auscultation       Cardiovascular hypertension, Pt. on medications and Pt. on home beta blockers + CAD and + Past MI (MI 2019, plavix- LD 7d ago)  Normal cardiovascular exam Rhythm:Regular Rate:Normal     Neuro/Psych  Headaches  negative psych ROS   GI/Hepatic Neg liver ROS,GERD  Controlled,,  Endo/Other  negative endocrine ROS    Renal/GU negative Renal ROS  negative genitourinary   Musculoskeletal  (+) Arthritis , Osteoarthritis,    Abdominal   Peds negative pediatric ROS (+)  Hematology negative hematology ROS (+)   Anesthesia Other Findings   Reproductive/Obstetrics negative OB ROS                             Anesthesia Physical Anesthesia Plan  ASA: 3  Anesthesia Plan: Spinal, Regional and MAC   Post-op Pain Management: Regional block* and Tylenol PO (pre-op)*   Induction:   PONV Risk Score and Plan: 2 and Propofol infusion and TIVA  Airway Management Planned: Natural Airway and Nasal Cannula  Additional Equipment: None  Intra-op Plan:   Post-operative Plan:   Informed Consent: I have reviewed the patients History and Physical, chart, labs and discussed the procedure including the risks, benefits and alternatives for the proposed anesthesia with the patient or authorized representative who has indicated his/her understanding and acceptance.       Plan Discussed  with: CRNA  Anesthesia Plan Comments:        Anesthesia Quick Evaluation

## 2022-03-26 NOTE — Anesthesia Procedure Notes (Signed)
Spinal  Patient location during procedure: OR Start time: 03/26/2022 9:26 AM End time: 03/26/2022 9:33 AM Reason for block: surgical anesthesia Staffing Performed: anesthesiologist  Anesthesiologist: Pervis Hocking, DO Performed by: Pervis Hocking, DO Authorized by: Pervis Hocking, DO   Preanesthetic Checklist Completed: patient identified, IV checked, risks and benefits discussed, surgical consent, monitors and equipment checked, pre-op evaluation and timeout performed Spinal Block Patient position: sitting Prep: DuraPrep and site prepped and draped Patient monitoring: cardiac monitor, continuous pulse ox and blood pressure Approach: midline Location: L3-4 Injection technique: single-shot Needle Needle type: Pencan  Needle gauge: 24 G Needle length: 9 cm Assessment Sensory level: T6 Events: CSF return and second provider Additional Notes Functioning IV was confirmed and monitors were applied. Sterile prep and drape, including hand hygiene and sterile gloves were used. The patient was positioned and the spine was prepped. The skin was anesthetized with lidocaine.  Free flow of clear CSF was obtained prior to injecting local anesthetic into the CSF.  The spinal needle aspirated freely following injection.  The needle was carefully withdrawn.  The patient tolerated the procedure well.

## 2022-03-26 NOTE — Progress Notes (Signed)
Orthopedic Tech Progress Note Patient Details:  [name] [name] September 20, 1940 [id number]  CPM Right Knee CPM Right Knee: On Right Knee Flexion (Degrees): 90 Right Knee Extension (Degrees): 0  Post Interventions Patient Tolerated: Well Instructions Provided: Adjustment of device Ortho Devices Type of Ortho Device: Bone foam zero knee, CPM padding   Post Interventions Patient Tolerated: Well Instructions Provided: Adjustment of device  Chaunte Hornbeck,Baraa OTR/L 03/26/2022, 11:41 AM

## 2022-03-26 NOTE — H&P (Signed)
[name] [name] MRN:  [id number] DOB/SEX:  [birth year]/male  CHIEF COMPLAINT:  Painful right Knee  HISTORY: Patient is a 82 y.o. male presented with a history of pain in the right knee. Onset of symptoms was gradual starting a few years ago with gradually worsening course since that time. Patient has been treated conservatively with over-the-counter NSAIDs and activity modification. Patient currently rates pain in the knee at 10 out of 10 with activity. There is pain at night.  PAST MEDICAL HISTORY: Patient Active Problem List   Diagnosis Date Noted   Umbilical hernia    PVC's (premature ventricular contractions)    Murmur    Mass of right kidney    Hypertension    History of MI (myocardial infarction)    GERD (gastroesophageal reflux disease)    Coronary artery disease    Arthritis    Unstable angina ([place]) 03/22/2017   PVCs (premature ventricular contractions) 03/15/2017   Prostate cancer ([place]) 03/11/2017   Cerebral vascular malformation    Hyperlipidemia 03/06/2017   Essential hypertension 03/06/2017   NSTEMI (non-ST elevated myocardial infarction) ([place]) 03/04/2017   Coronary artery disease involving native coronary artery of native heart with unstable angina pectoris (HCC)    Acute myocardial infarction, subendocardial infarction, episode of care unspecified    Gallstones 08/07/2012   Myocardial infarction ([place]) 04/14/2007   Past Medical History:  Diagnosis Date   Acute myocardial infarction, subendocardial infarction, episode of care unspecified    Arthritis    "left pinky" (03/04/2017)   Cerebral vascular malformation    PT'S NEUROLOGIST IS DR. 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 s/p stent to Cx 2009. b. recent NSTEMI s/p DES to mLCx and m/dRCA.   Coronary artery disease involving native coronary artery of native heart with unstable angina pectoris ([place])    Essential  hypertension 03/06/2017   Gallstones 08/07/2012   GERD (gastroesophageal reflux disease)    Headache(784.0)    POSSIBLE TRIGEMINAL NEURALGIA    History of MI (myocardial infarction)    Hyperlipidemia    Hypertension    Mass of right kidney    FOUND ON WORK UP FOR ABD PAIN   Murmur    Myocardial infarction [facility]) 04-14-07   NSTEMI (non-ST elevated myocardial infarction) ([place]) 03/04/2017   [name] 03/04/2017   Prostate cancer ([place])    PVC's (premature ventricular contractions)    PVCs (premature ventricular contractions) [id number]   Umbilical hernia    Unstable angina ([place]) 03/22/2017   Past Surgical History:  Procedure Laterality Date   CHOLECYSTECTOMY N/A 09/19/2012   Procedure: ROBOTIC ASSISTED LAPAROSCOPIC CHOLECYSTECTOMY WITH UMBILICAL HERNIA REPAIR;  Surgeon: Leighton Ruff, MD;  Location: [facility];  Service: General;  Laterality: N/A;   COLONOSCOPY  07/08/2013   Significant rectal polyp status post polypectomy. Mild sigmoid diverticulosis   CORONARY ANGIOPLASTY WITH STENT PLACEMENT  04/14/2007   CORONARY STENT INTERVENTION N/A 03/05/2017   Procedure: CORONARY STENT INTERVENTION;  Surgeon: Martinique, Peter M, MD;  Location: [place] [facility];  Service: Cardiovascular;  Laterality: N/A;   FLEXIBLE SIGMOIDOSCOPY  09/30/2013   Rectal scar at the area of previous polypectomy- no evidence of recurrance/residual polyp. Mild sigmoid diverticulosis   HERNIA REPAIR  05/30/2010   JOINT REPLACEMENT     LEFT HEART CATH AND CORONARY ANGIOGRAPHY N/A 03/04/2017   Procedure: LEFT HEART CATH AND CORONARY ANGIOGRAPHY;  Surgeon: Belva Crome, MD;  Location: [place] [facility];  Service: Cardiovascular;  Laterality: N/A;   LEFT HEART CATH AND CORONARY ANGIOGRAPHY N/A 03/25/2017   Procedure: LEFT HEART CATH AND CORONARY ANGIOGRAPHY;  Surgeon: Lorretta Harp, MD;  Location: [place] [facility];  Service: Cardiovascular;  Laterality: N/A;   POLYPECTOMY     RADIOACTIVE SEED IMPLANT FOR PROSTATE CANCER   03/15/2004   REPLACEMENT TOTAL KNEE Left 05/30/2010   ROBOTIC ASSITED PARTIAL NEPHRECTOMY Right 09/19/2012   Procedure: ROBOTIC ASSITED PARTIAL NEPHRECTOMY;  Surgeon: Molli Hazard, MD;  Location: [facility];  Service: Urology;  Laterality: Right;   SHOULDER OPEN ROTATOR CUFF REPAIR Left 03/30/2008   TOTAL HIP ARTHROPLASTY Right 01/2001   TOTAL HIP REVISION Right 07/2008   acetabular revision   ULTRASOUND GUIDANCE FOR VASCULAR ACCESS  03/05/2017   Procedure: Ultrasound Guidance For Vascular Access;  Surgeon: Martinique, Peter M, MD;  Location: [place] [facility];  Service: Cardiovascular;;     MEDICATIONS:   Medications Prior to Admission  Medication Sig Dispense Refill Last Dose   amLODipine (NORVASC) 5 MG tablet Take 5 mg by mouth every morning.      aspirin 81 MG tablet Take 1 tablet (81 mg total) by mouth daily. 30 tablet     clopidogrel (PLAVIX) 75 MG tablet Take 75 mg by mouth daily.      Evolocumab 140 MG/ML SOSY Inject 140 mg into the skin every 14 (fourteen) days. 6 mL 3    metoprolol tartrate (LOPRESSOR) 25 MG tablet Take 1 tablet (25 mg total) by mouth 2 (two) times daily. 180 tablet 3    nitroGLYCERIN (NITROSTAT) 0.4 MG SL tablet Place 1 tablet (0.4 mg total) under the tongue every 5 (five) minutes as needed for chest pain. 25 tablet 11    pantoprazole (PROTONIX) 40 MG tablet TAKE 1 TABLET BY MOUTH DAILY AT 6:00 AM (Patient taking differently: Take 40 mg by mouth daily.) 90 tablet 0     ALLERGIES:   Allergies  Allergen Reactions   Ativan [Lorazepam] Other (See Comments)    "Sees things."   Lisinopril Cough    REVIEW OF SYSTEMS:  A comprehensive review of systems was negative except for: Musculoskeletal: positive for arthralgias and bone pain   FAMILY HISTORY:   Family History  Problem Relation Age of Onset   Heart attack Mother    Lung cancer Sister    Colon cancer Neg Hx    Esophageal cancer Neg Hx    Colon polyps Neg Hx    Rectal cancer Neg Hx    Stomach  cancer Neg Hx     SOCIAL HISTORY:   Social History   Tobacco Use   Smoking status: Former    Packs/day: 1.00    Years: 30.00    Total pack years: 30.00    Types: Cigarettes    Quit date: 1980    Years since quitting: 44.2   Smokeless tobacco: Never   Tobacco comments:    "stopped ~ 1980"  Substance Use Topics   Alcohol use: No     EXAMINATION:  Vital signs in last 24 hours:    There were no vitals taken for this visit.  General Appearance:    Alert, cooperative, no distress, appears stated age  Head:    Normocephalic, without obvious abnormality, atraumatic  Eyes:    PERRL, conjunctiva/corneas clear, EOM's intact, fundi    benign, both eyes       Ears:    Normal TM's and external ear  canals, both ears  Nose:   Nares normal, septum midline, mucosa normal, no drainage    or sinus tenderness  Throat:   Lips, mucosa, and tongue normal; teeth and gums normal  Neck:   Supple, symmetrical, trachea midline, no adenopathy;       thyroid:  No enlargement/tenderness/nodules; no carotid   bruit or JVD  Back:     Symmetric, no curvature, ROM normal, no CVA tenderness  Lungs:     Clear to auscultation bilaterally, respirations unlabored  Chest wall:    No tenderness or deformity  Heart:    Regular rate and rhythm, S1 and S2 normal, no murmur, rub   or gallop  Abdomen:     Soft, non-tender, bowel sounds active all four quadrants,    no masses, no organomegaly  Genitalia:    Normal male without lesion, discharge or tenderness  Rectal:    Normal tone, normal prostate, no masses or tenderness;   guaiac negative stool  Extremities:   Extremities normal, atraumatic, no cyanosis or edema  Pulses:   2+ and symmetric all extremities  Skin:   Skin color, texture, turgor normal, no rashes or lesions  Lymph nodes:   Cervical, supraclavicular, and axillary nodes normal  Neurologic:   CNII-XII intact. Normal strength, sensation and reflexes      throughout    Musculoskeletal:  ROM 0-120,  Ligaments intact,  Imaging Review Plain radiographs demonstrate severe degenerative joint disease of the right knee. The overall alignment is neutral. The bone quality appears to be good for age and reported activity level.  Assessment/Plan: Primary osteoarthritis, right knee   The patient history, physical examination and imaging studies are consistent with advanced degenerative joint disease of the right knee. The patient has failed conservative treatment.  The clearance notes were reviewed.  After discussion with the patient it was felt that Total Knee Replacement was indicated. The procedure,  risks, and benefits of total knee arthroplasty were presented and reviewed. The risks including but not limited to aseptic loosening, infection, blood clots, vascular injury, stiffness, patella tracking problems complications among others were discussed. The patient acknowledged the explanation, agreed to proceed with the plan.  Preoperative templating of the joint replacement has been completed, documented, and submitted to the Operating Room personnel in order to optimize intra-operative equipment management.    Patient's anticipated LOS is less than 2 midnights, meeting these requirements: - Lives within 1 hour of care - Has a competent adult at home to recover with post-op recover - NO history of  - Chronic pain requiring opiods  - Diabetes  - Coronary Artery Disease  - Heart failure  - Heart attack  - Stroke  - DVT/VTE  - Cardiac arrhythmia  - Respiratory Failure/COPD  - Renal failure  - Anemia  - Advanced Liver disease     Donia Ast 03/26/2022, 7:06 AM

## 2022-03-26 NOTE — Evaluation (Signed)
Physical Therapy Evaluation Patient Details Name: LEBARRON WILENSKY MRN: [id number] DOB: [birth year] Today's Date: 03/26/2022  History of Present Illness  Pt is 82 yo male admitted 03/26/22 s/p R TKA. Pt PMH includes but is not limited to: CAD s/p stent placement and cath, HTN, prostate ca s/p radiation, HDL, NSTEMI and multiple MI, L TKA, L RTC repair, R THA and revision.  Clinical Impression  Pt is s/p TKA resulting in the deficits listed below (see PT Problem List). At baseline, pt independent and ambulatory without AD.  Pt has home support and DME. At evaluation, he was able to ambulate 100' with RW and min guard, good quad activation, ROM, and pain control.  Pt expected to progress very well with therapy. Pt will benefit from skilled PT to increase their independence and safety with mobility to allow discharge to the venue listed below.         Recommendations for follow up therapy are one component of a multi-disciplinary discharge planning process, led by the attending physician.  Recommendations may be updated based on patient status, additional functional criteria and insurance authorization.  Follow Up Recommendations Follow physician's recommendations for discharge plan and follow up therapies      Assistance Recommended at Discharge Intermittent Supervision/Assistance  Patient can return home with the following  A little help with walking and/or transfers;A little help with bathing/dressing/bathroom;Assistance with cooking/housework;Help with stairs or ramp for entrance    Equipment Recommendations None recommended by PT  Recommendations for Other Services       Functional Status Assessment Patient has had a recent decline in their functional status and demonstrates the ability to make significant improvements in function in a reasonable and predictable amount of time.     Precautions / Restrictions Precautions Precautions: Fall;Knee Restrictions Weight Bearing Restrictions:  Yes RLE Weight Bearing: Weight bearing as tolerated      Mobility  Bed Mobility Overal bed mobility: Needs Assistance Bed Mobility: Supine to Sit, Sit to Supine     Supine to sit: Supervision Sit to supine: Supervision        Transfers Overall transfer level: Needs assistance Equipment used: Rolling walker (2 wheels) Transfers: Sit to/from Stand Sit to Stand: Min guard, Min assist           General transfer comment: Performed x 2 with cues for hand placement and R LE management.  Initially min A to steady but improved to min guard on second rep    Ambulation/Gait Ambulation/Gait assistance: Min guard Gait Distance (Feet): 100 Feet Assistive device: Rolling walker (2 wheels) Gait Pattern/deviations: Step-through pattern, Decreased stride length, Decreased weight shift to right Gait velocity: decreased     General Gait Details: Slight antalgic pattern with cues for RW proximity  Stairs            Wheelchair Mobility    Modified Rankin (Stroke Patients Only)       Balance Overall balance assessment: Needs assistance Sitting-balance support: No upper extremity supported Sitting balance-Leahy Scale: Good     Standing balance support: Bilateral upper extremity supported, No upper extremity supported, Reliant on assistive device for balance Standing balance-Leahy Scale: Fair Standing balance comment: RW to ambulate but able to static stand without UE support                             Pertinent Vitals/Pain Pain Assessment Pain Assessment: No/denies pain    Home Living Family/patient expects to be discharged  to:: Private residence Living Arrangements: Spouse/significant other Available Help at Discharge: Family;Available 24 hours/day Type of Home: House Home Access: Stairs to enter   [employer] of Steps: just small threshold   Home Layout: One level Home Equipment: International aid/development worker (2 wheels);Cane -  single point      Prior Function Prior Level of Function : Independent/Modified Independent;Working/employed;Driving             Mobility Comments: could ambulate in community without AD ADLs Comments: working 2 days a week     Hand Dominance        Extremity/Trunk Assessment   Upper Extremity Assessment Upper Extremity Assessment: Overall WFL for tasks assessed    Lower Extremity Assessment Lower Extremity Assessment: RLE deficits/detail;LLE deficits/detail RLE Deficits / Details: Expected post op changes; ROM: knee 5 to 90 degrees; MMT: 5/5 ankle, 3/5 hip and knee LLE Deficits / Details: ROM WFL; MMT 5/5    Cervical / Trunk Assessment Cervical / Trunk Assessment: Normal  Communication   Communication: No difficulties  Cognition Arousal/Alertness: Awake/alert Behavior During Therapy: WFL for tasks assessed/performed Overall Cognitive Status: Within Functional Limits for tasks assessed                                          General Comments General comments (skin integrity, edema, etc.): VSS    Exercises     Assessment/Plan    PT Assessment Patient needs continued PT services  PT Problem List Decreased strength;Pain;Decreased range of motion;Decreased activity tolerance;Decreased mobility;Decreased balance;Decreased knowledge of precautions;Decreased knowledge of use of DME       PT Treatment Interventions DME instruction;Therapeutic exercise;Gait training;Balance training;Stair training;Functional mobility training;Therapeutic activities;Patient/family education;Modalities    PT Goals (Current goals can be found in the Care Plan section)  Acute Rehab PT Goals Patient Stated Goal: return home PT Goal Formulation: With patient/family Time For Goal Achievement: 04/09/22 Potential to Achieve Goals: Good    Frequency 7X/week     Co-evaluation               AM-PAC PT "6 Clicks" Mobility  Outcome Measure Help needed turning from  your back to your side while in a flat bed without using bedrails?: None Help needed moving from lying on your back to sitting on the side of a flat bed without using bedrails?: A Little Help needed moving to and from a bed to a chair (including a wheelchair)?: A Little Help needed standing up from a chair using your arms (e.g., wheelchair or bedside chair)?: A Little Help needed to walk in hospital room?: A Little Help needed climbing 3-5 steps with a railing? : A Little 6 Click Score: 19    End of Session Equipment Utilized During Treatment: Gait belt Activity Tolerance: Patient tolerated treatment well Patient left: with chair alarm set;in chair;with call bell/phone within reach Nurse Communication: Mobility status PT Visit Diagnosis: Other abnormalities of gait and mobility (R26.89);Muscle weakness (generalized) (M62.81)    Time: [id number] PT Time Calculation (min) (ACUTE ONLY): 31 min   Charges:   PT Evaluation $PT Eval Low Complexity: 1 Low PT Treatments $Gait Training: 8-22 mins        Abran Richard, PT Acute Rehab [facility] 03/26/2022, 3:58 PM

## 2022-03-27 DIAGNOSIS — M1711 Unilateral primary osteoarthritis, right knee: Secondary | ICD-10-CM | POA: Diagnosis not present

## 2022-03-27 DIAGNOSIS — Z96651 Presence of right artificial knee joint: Secondary | ICD-10-CM | POA: Diagnosis not present

## 2022-03-27 MED ORDER — OXYCODONE HCL 5 MG PO TABS: 5.0000 mg | ORAL_TABLET | Freq: Four times a day (QID) | ORAL | 0 refills | Status: AC | PRN

## 2022-03-27 NOTE — Progress Notes (Signed)
SPORTS MEDICINE AND JOINT REPLACEMENT  Lara Mulch, MD    Carlyon Shadow, PA-C [place], [place], [place]  [address]                             [phone]   PROGRESS NOTE  Subjective:  negative for Chest Pain  negative for Shortness of Breath  negative for Nausea/Vomiting   negative for Calf Pain  negative for Bowel Movement   Tolerating Diet: yes         Patient reports pain as 5 on 0-10 scale.    Objective: Vital signs in last 24 hours:   Patient Vitals for the past 24 hrs:  BP Temp Temp src Pulse Resp SpO2  03/27/22 1008 123/66 98.2 F (36.8 C) Oral (!) 58 18 99 %  03/27/22 0839 -- -- -- 65 -- --  03/27/22 0539 120/75 98.2 F (36.8 C) Oral (!) 55 18 94 %  03/27/22 0140 113/70 98.2 F (36.8 C) Oral 67 18 96 %  03/26/22 2127 131/72 98.5 F (36.9 C) Oral 69 16 94 %  03/26/22 1807 (!) 155/92 97.7 F (36.5 C) -- 91 18 96 %  03/26/22 1423 131/72 (!) 97.4 F (36.3 C) -- (!) 58 15 97 %  03/26/22 1252 130/75 (!) 97.5 F (36.4 C) Oral 63 16 98 %    '@flow'$ {1959:LAST@   Intake/Output from previous day:   03/04 0701 - 03/05 0700 In: 2801.2 [P.O.:480; I.V.:2121.2] Out: 1275 [Urine:1225]   Intake/Output this shift:   03/05 0701 - 03/05 1900 In: 240 [P.O.:240] Out: -    Intake/Output      03/04 0701 03/05 0700 03/05 0701 03/06 0700   P.O. 480 240   I.V. (mL/kg) 2121.2 (22.5)    IV Piggyback 200    Total Intake(mL/kg) 2801.2 (29.7) 240 (2.5)   Urine (mL/kg/hr) 1225 (0.5)    Stool 0    Blood 50    Total Output 1275    Net +1526.2 +240        Urine Occurrence 0 x    Stool Occurrence 0 x       LABORATORY DATA: Recent Labs    03/23/22 1343  WBC 7.1  HGB 14.8  HCT 43.7  PLT 167   Recent Labs    03/23/22 1343  NA 139  K 3.6  CL 107  CO2 24  BUN 18  CREATININE 1.12  GLUCOSE 109*  CALCIUM 8.5*   Lab Results  Component Value Date   INR 1.18 03/25/2017   INR 1.07 03/04/2017   INR 1.2 08/12/2008    Examination:  General  appearance: alert, cooperative, and no distress Extremities: extremities normal, atraumatic, no cyanosis or edema  Wound Exam: clean, dry, intact   Drainage:  None: wound tissue dry  Motor Exam: Quadriceps and Hamstrings Intact  Sensory Exam: Superficial Peroneal, Deep Peroneal, and Tibial normal   Assessment:    1 Day Post-Op  Procedure(s) (LRB): TOTAL KNEE ARTHROPLASTY (Right)  ADDITIONAL DIAGNOSIS:  Principal Problem:   S/P total knee replacement     Plan: Physical Therapy as ordered Weight Bearing as Tolerated (WBAT)  DVT Prophylaxis:  Aspirin  DISCHARGE PLAN: Home  DISCHARGE NEEDS: HHPT     Patient doing well and ready for D/C home  Patient's anticipated LOS is less than 2 midnights, meeting these requirements: - Younger than 66 - Lives within 1 hour of care - Has a competent adult at  home to recover with post-op recover - NO history of  - Chronic pain requiring opiods  - Diabetes  - Coronary Artery Disease  - Heart failure  - Heart attack  - Stroke  - DVT/VTE  - Cardiac arrhythmia  - Respiratory Failure/COPD  - Renal failure  - Anemia  - Advanced Liver disease      Donia Ast 03/27/2022, 12:45 PM

## 2022-03-27 NOTE — Progress Notes (Addendum)
Physical Therapy Treatment Patient Details Name: [name] [name] MRN: [id number] DOB: March 13, 1940 Today's Date: 03/27/2022   History of Present Illness Pt is 82 yo male admitted 03/26/22 s/p R TKA. Pt PMH includes but is not limited to: CAD s/p stent placement and cath, HTN, prostate ca s/p radiation, HDL, NSTEMI and multiple MI, L TKA, L RTC repair, R THA and revision.    PT Comments    POD # 1 am session General Comments: AxO x 3  pleasant/motivated.  Progressing well.  Retired Art therapist. Eager to D/C to home this morning.  Assisted OOB and amb in hallway went well.  General bed mobility comments: self able to perform a SLR.  General transfer comment: good use of B UE's to steady self.  Good safety cognition.  Self able. General Gait Details: tolerated a functional distance of 11 5 feet alternating gait.  Good safety awareness.  Minimal c/o pain. No stairs to enter home, just a threshold.  Verbally Instructed "up with the good/down with the bad".  Then returned to room to perform some TE's following HEP handout.  Instructed on proper tech, freq as well as use of ICE.   Addressed all mobility questions, discussed appropriate activity, educated on use of ICE.  Pt ready for D/C to home.   Recommendations for follow up therapy are one component of a multi-disciplinary discharge planning process, led by the attending physician.  Recommendations may be updated based on patient status, additional functional criteria and insurance authorization.  Follow Up Recommendations  Follow physician's recommendations for discharge plan and follow up therapies     Assistance Recommended at Discharge Intermittent Supervision/Assistance  Patient can return home with the following A little help with walking and/or transfers;A little help with bathing/dressing/bathroom;Assistance with cooking/housework;Help with stairs or ramp for entrance   Equipment Recommendations  None recommended by PT     Recommendations for Other Services       Precautions / Restrictions Precautions Precautions: Fall;Knee Precaution Comments: instructed no pillow under knee Restrictions Weight Bearing Restrictions: No RLE Weight Bearing: Weight bearing as tolerated     Mobility  Bed Mobility Overal bed mobility: Modified Independent             General bed mobility comments: self able to perform a SLR    Transfers Overall transfer level: Needs assistance Equipment used: Rolling walker (2 wheels) Transfers: Sit to/from Stand Sit to Stand: Modified independent (Device/Increase time), Supervision           General transfer comment: good use of B UE's to steady self.  Good safety cognition.  Self able.    Ambulation/Gait Ambulation/Gait assistance: Supervision Gait Distance (Feet): 115 Feet Assistive device: Rolling walker (2 wheels) Gait Pattern/deviations: Step-through pattern, Decreased stride length, Decreased weight shift to right Gait velocity: decreased     General Gait Details: tolerated a functional distance of 11 5 feet alternating gait.  Good safety awareness.  Minimal c/o pain.   Stairs Stairs:  (NO stairs to enter home, just a threshold)           Wheelchair Mobility    Modified Rankin (Stroke Patients Only)       Balance                                            Cognition Arousal/Alertness: Awake/alert Behavior During Therapy: WFL for tasks assessed/performed Overall  Cognitive Status: Within Functional Limits for tasks assessed                                 General Comments: AxO x 3  pleasant/motivated.  Progressing well.  Retired Scientist, physiological  Total Knee Replacement TE's following HEP handout 10 reps B LE ankle pumps 05 reps towel squeezes 05 reps knee presses 05 reps heel slides  05 reps SAQ's 05 reps SLR's 05 reps ABD 05 reps SAQ's 05 reps knee bends Educated on use of  gait belt to assist with TE's Followed by ICE     General Comments        Pertinent Vitals/Pain Pain Assessment Pain Assessment: 0-10 Pain Score: 2  Pain Location: R knee Pain Descriptors / Indicators: Operative site guarding, Sore Pain Intervention(s): Monitored during session, Premedicated before session, Repositioned, Ice applied    Home Living                          Prior Function            PT Goals (current goals can now be found in the care plan section) Progress towards PT goals: Progressing toward goals    Frequency    7X/week      PT Plan Current plan remains appropriate    Co-evaluation              AM-PAC PT "6 Clicks" Mobility   Outcome Measure  Help needed turning from your back to your side while in a flat bed without using bedrails?: None Help needed moving from lying on your back to sitting on the side of a flat bed without using bedrails?: None Help needed moving to and from a bed to a chair (including a wheelchair)?: None Help needed standing up from a chair using your arms (e.g., wheelchair or bedside chair)?: None Help needed to walk in hospital room?: None Help needed climbing 3-5 steps with a railing? : A Little 6 Click Score: 23    End of Session Equipment Utilized During Treatment: Gait belt Activity Tolerance: Patient tolerated treatment well Patient left: with chair alarm set;in chair;with call bell/phone within reach Nurse Communication: Mobility status PT Visit Diagnosis: Other abnormalities of gait and mobility (R26.89);Muscle weakness (generalized) (M62.81)     Time: [id number] PT Time Calculation (min) (ACUTE ONLY): 29 min  Charges:  $Gait Training: 8-22 mins $Therapeutic Exercise: 8-22 mins                    Rica Koyanagi  PTA [facility] M-F          [phone] Weekend pager [phone]

## 2022-03-27 NOTE — Plan of Care (Signed)
  Problem: Activity: Goal: Ability to avoid complications of mobility impairment will improve Outcome: Progressing   Problem: Pain Management: Goal: Pain level will decrease with appropriate interventions Outcome: Progressing   Problem: Activity: Goal: Risk for activity intolerance will decrease Outcome: Progressing

## 2022-03-27 NOTE — Discharge Summary (Signed)
SPORTS MEDICINE & JOINT REPLACEMENT   Lara Mulch, MD   Carlyon Shadow, PA-C [address], [place] [place], [place]  [id number]                             [phone]  PATIENT ID: [name] [name]        MRN:  [id number]          DOB/AGE: 81-Jan-1942 / 81 y.o.    DISCHARGE SUMMARY  ADMISSION DATE:    03/26/2022 DISCHARGE DATE:   03/27/2022   ADMISSION DIAGNOSIS: S/P total knee replacement [Z96.659]    DISCHARGE DIAGNOSIS:  Osteoarthritis of right knee M17.11    ADDITIONAL DIAGNOSIS: Principal Problem:   S/P total knee replacement  Past Medical History:  Diagnosis Date   Acute myocardial infarction, subendocardial infarction, episode of care unspecified    Arthritis    "left pinky" (03/04/2017)   Cerebral vascular malformation    PT'S NEUROLOGIST IS DR. 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 s/p stent to Cx 2009. b. recent NSTEMI s/p DES to mLCx and m/dRCA.   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    History of MI (myocardial infarction)    Hyperlipidemia    Hypertension    Mass of right kidney    FOUND ON WORK UP FOR ABD PAIN   Murmur    Myocardial infarction [facility]) 04-14-07   NSTEMI (non-ST elevated myocardial infarction) ([place]) 03/04/2017   [name] 03/04/2017   Prostate cancer ([place])    PVC's (premature ventricular contractions)    PVCs (premature ventricular contractions) [id number]   Umbilical hernia    Unstable angina ([place]) 03/22/2017    PROCEDURE: Procedure(s): TOTAL KNEE ARTHROPLASTY on 03/26/2022  CONSULTS:    HISTORY:  See H&P in chart  HOSPITAL COURSE:  [name] [name] is a 82 y.o. admitted on 03/26/2022 and found to have a diagnosis of  Osteoarthritis of right knee M17.11.  After appropriate laboratory studies were obtained  they were taken to the operating room on 03/26/2022 and underwent Procedure(s): TOTAL KNEE ARTHROPLASTY.   They were given perioperative antibiotics:  Anti-infectives (From admission, onward)    Start     Dose/Rate Route Frequency Ordered Stop   03/26/22 0730  ceFAZolin (ANCEF) IVPB 2g/100 mL premix        2 g 200 mL/hr over 30 Minutes Intravenous On call to O.R. 03/26/22 0725 03/26/22 1004     .  Patient given tranexamic acid IV or topical and exparel intra-operatively.  Tolerated the procedure well.    POD# 1: Vital signs were stable.  Patient denied Chest pain, shortness of breath, or calf pain.  Patient was started on Aspirin twice daily at 8am.  Consults to PT, OT, and care management were made.  The patient was weight bearing as tolerated.  CPM was placed on the operative leg 0-90 degrees for 6-8 hours a day. When out of the CPM, patient was placed in the foam block to achieve full extension. Incentive spirometry was taught.  Dressing was changed.       POD #2, Continued  PT for ambulation and exercise program.  IV  saline locked.  O2 discontinued.    The remainder of the hospital course was dedicated to ambulation and strengthening.   The patient was discharged on 1 Day Post-Op in  Good condition.  Blood products given:none  DIAGNOSTIC STUDIES: Recent vital signs: Patient Vitals for the past 24 hrs:  BP Temp Temp src Pulse Resp SpO2  03/27/22 1008 123/66 98.2 F (36.8 C) Oral (!) 58 18 99 %  03/27/22 0839 -- -- -- 65 -- --  03/27/22 0539 120/75 98.2 F (36.8 C) Oral (!) 55 18 94 %  03/27/22 0140 113/70 98.2 F (36.8 C) Oral 67 18 96 %  03/26/22 2127 131/72 98.5 F (36.9 C) Oral 69 16 94 %  03/26/22 1807 (!) 155/92 97.7 F (36.5 C) -- 91 18 96 %  03/26/22 1423 131/72 (!) 97.4 F (36.3 C) -- (!) 58 15 97 %       Recent laboratory studies: Recent Labs    03/23/22 1343  WBC 7.1   HGB 14.8  HCT 43.7  PLT 167   Recent Labs    03/23/22 1343  NA 139  K 3.6  CL 107  CO2 24  BUN 18  CREATININE 1.12  GLUCOSE 109*  CALCIUM 8.5*   Lab Results  Component Value Date   INR 1.18 03/25/2017   INR 1.07 03/04/2017   INR 1.2 08/12/2008     Recent Radiographic Studies :  No results found.  DISCHARGE INSTRUCTIONS: Discharge Instructions     Call MD / Call 911   Complete by: As directed    If you experience chest pain or shortness of breath, CALL 911 and be transported to the hospital emergency room.  If you develope a fever above 101 F, pus (white drainage) or increased drainage or redness at the wound, or calf pain, call your surgeon's office.   Constipation Prevention   Complete by: As directed    Drink plenty of fluids.  Prune juice may be helpful.  You may use a stool softener, such as Colace (over the counter) 100 mg twice a day.  Use MiraLax (over the counter) for constipation as needed.   Diet - low sodium heart healthy   Complete by: As directed    Discharge instructions   Complete by: As directed    INSTRUCTIONS AFTER JOINT REPLACEMENT   Remove items at home which could result in a fall. This includes throw rugs or furniture in walking pathways ICE to the affected joint every three hours while awake for 30 minutes at a time, for at least the first 3-5 days, and then as needed for pain and swelling.  Continue to use ice for pain and swelling. You may notice swelling that will progress down to the foot and ankle.  This is normal after surgery.  Elevate your leg when you are not up walking on it.   Continue to use the breathing machine you got in the hospital (incentive spirometer) which will help keep your temperature down.  It is common for your temperature to cycle up and down following surgery, especially at night when you are not up moving around and exerting yourself.  The breathing machine keeps your lungs expanded and your temperature down.   DIET:   As you were doing prior to hospitalization, we recommend a well-balanced diet.  DRESSING / WOUND CARE / SHOWERING  Keep the surgical dressing until follow up.  The dressing is water proof, so you can shower without any extra covering.  IF THE DRESSING FALLS OFF or the wound gets wet inside, change the dressing with sterile gauze.  Please use good hand washing techniques before changing the dressing.  Do not use any lotions or creams on the incision until instructed by your surgeon.    ACTIVITY  Increase activity slowly as tolerated, but follow the weight bearing instructions below.   No driving for 6 weeks or until further direction given by your physician.  You cannot drive while taking narcotics.  No lifting or carrying greater than 10 lbs. until further directed by your surgeon. Avoid periods of inactivity such as sitting longer than an hour when not asleep. This helps prevent blood clots.  You may return to work once you are authorized by your doctor.     WEIGHT BEARING   Weight bearing as tolerated with assist device (walker, cane, etc) as directed, use it as long as suggested by your surgeon or therapist, typically at least 4-6 weeks.   EXERCISES  Results after joint replacement surgery are often greatly improved when you follow the exercise, range of motion and muscle strengthening exercises prescribed by your doctor. Safety measures are also important to protect the joint from further injury. Any time any of these exercises cause you to have increased pain or swelling, decrease what you are doing until you are comfortable again and then slowly increase them. If you have problems or questions, call your caregiver or physical therapist for advice.   Rehabilitation is important following a joint replacement. After just a few days of immobilization, the muscles of the leg can become weakened and shrink (atrophy).  These exercises are designed to build up the tone and strength of the  thigh and leg muscles and to improve motion. Often times heat used for twenty to thirty minutes before working out will loosen up your tissues and help with improving the range of motion but do not use heat for the first two weeks following surgery (sometimes heat can increase post-operative swelling).   These exercises can be done on a training (exercise) mat, on the floor, on a table or on a bed. Use whatever works the best and is most comfortable for you.    Use music or television while you are exercising so that the exercises are a pleasant break in your day. This will make your life better with the exercises acting as a break in your routine that you can look forward to.   Perform all exercises about fifteen times, three times per day or as directed.  You should exercise both the operative leg and the other leg as well.  Exercises include:   Quad Sets - Tighten up the muscle on the front of the thigh (Quad) and hold for 5-10 seconds.   Straight Leg Raises - With your knee straight (if you were given a brace, keep it on), lift the leg to 60 degrees, hold for 3 seconds, and slowly lower the leg.  Perform this exercise against resistance later as your leg gets stronger.  Leg Slides: Lying on your back, slowly slide your foot toward your buttocks, bending your knee up off the floor (only go as far as is comfortable). Then slowly slide your foot back down until your leg is flat on the floor again.  Angel Wings: Lying on your back spread your legs to the side as far apart as you can without causing discomfort.  Hamstring Strength:  Lying on your back, push your heel against the floor with  your leg straight by tightening up the muscles of your buttocks.  Repeat, but this time bend your knee to a comfortable angle, and push your heel against the floor.  You may put a pillow under the heel to make it more comfortable if necessary.   A rehabilitation program following joint replacement surgery can speed  recovery and prevent re-injury in the future due to weakened muscles. Contact your doctor or a physical therapist for more information on knee rehabilitation.    CONSTIPATION  Constipation is defined medically as fewer than three stools per week and severe constipation as less than one stool per week.  Even if you have a regular bowel pattern at home, your normal regimen is likely to be disrupted due to multiple reasons following surgery.  Combination of anesthesia, postoperative narcotics, change in appetite and fluid intake all can affect your bowels.   YOU MUST use at least one of the following options; they are listed in order of increasing strength to get the job done.  They are all available over the counter, and you may need to use some, POSSIBLY even all of these options:    Drink plenty of fluids (prune juice may be helpful) and high fiber foods Colace 100 mg by mouth twice a day  Senokot for constipation as directed and as needed Dulcolax (bisacodyl), take with full glass of water  Miralax (polyethylene glycol) once or twice a day as needed.  If you have tried all these things and are unable to have a bowel movement in the first 3-4 days after surgery call either your surgeon or your primary doctor.    If you experience loose stools or diarrhea, hold the medications until you stool forms back up.  If your symptoms do not get better within 1 week or if they get worse, check with your doctor.  If you experience "the worst abdominal pain ever" or develop nausea or vomiting, please contact the office immediately for further recommendations for treatment.   ITCHING:  If you experience itching with your medications, try taking only a single pain pill, or even half a pain pill at a time.  You can also use Benadryl over the counter for itching or also to help with sleep.   TED HOSE STOCKINGS:  Use stockings on both legs until for at least 2 weeks or as directed by physician office. They may be  removed at night for sleeping.  MEDICATIONS:  See your medication summary on the "After Visit Summary" that nursing will review with you.  You may have some home medications which will be placed on hold until you complete the course of blood thinner medication.  It is important for you to complete the blood thinner medication as prescribed.  PRECAUTIONS:  If you experience chest pain or shortness of breath - call 911 immediately for transfer to the hospital emergency department.   If you develop a fever greater that 101 F, purulent drainage from wound, increased redness or drainage from wound, foul odor from the wound/dressing, or calf pain - CONTACT YOUR SURGEON.                                                   FOLLOW-UP APPOINTMENTS:  If you do not already have a post-op appointment, please call the office for an appointment to be seen  by your surgeon.  Guidelines for how soon to be seen are listed in your "After Visit Summary", but are typically between 1-4 weeks after surgery.  OTHER INSTRUCTIONS:   Knee Replacement:  Do not place pillow under knee, focus on keeping the knee straight while resting. CPM instructions: 0-90 degrees, 2 hours in the morning, 2 hours in the afternoon, and 2 hours in the evening. Place foam block, curve side up under heel at all times except when in CPM or when walking.  DO NOT modify, tear, cut, or change the foam block in any way.  POST-OPERATIVE OPIOID TAPER INSTRUCTIONS: It is important to wean off of your opioid medication as soon as possible. If you do not need pain medication after your surgery it is ok to stop day one. Opioids include: Codeine, Hydrocodone(Norco, Vicodin), Oxycodone(Percocet, oxycontin) and hydromorphone amongst others.  Long term and even short term use of opiods can cause: Increased pain response Dependence Constipation Depression Respiratory depression And more.  Withdrawal symptoms can include Flu like symptoms Nausea,  vomiting And more Techniques to manage these symptoms Hydrate well Eat regular healthy meals Stay active Use relaxation techniques(deep breathing, meditating, yoga) Do Not substitute Alcohol to help with tapering If you have been on opioids for less than two weeks and do not have pain than it is ok to stop all together.  Plan to wean off of opioids This plan should start within one week post op of your joint replacement. Maintain the same interval or time between taking each dose and first decrease the dose.  Cut the total daily intake of opioids by one tablet each day Next start to increase the time between doses. The last dose that should be eliminated is the evening dose.     MAKE SURE YOU:  Understand these instructions.  Get help right away if you are not doing well or get worse.    Thank you for letting us be a part of your medical care team.  It is a privilege we respect greatly.  We hope these instructions will help you stay on track for a fast and full recovery!   Increase activity slowly as tolerated   Complete by: As directed    Post-operative opioid taper instructions:   Complete by: As directed    POST-OPERATIVE OPIOID TAPER INSTRUCTIONS: It is important to wean off of your opioid medication as soon as possible. If you do not need pain medication after your surgery it is ok to stop day one. Opioids include: Codeine, Hydrocodone(Norco, Vicodin), Oxycodone(Percocet, oxycontin) and hydromorphone amongst others.  Long term and even short term use of opiods can cause: Increased pain response Dependence Constipation Depression Respiratory depression And more.  Withdrawal symptoms can include Flu like symptoms Nausea, vomiting And more Techniques to manage these symptoms Hydrate well Eat regular healthy meals Stay active Use relaxation techniques(deep breathing, meditating, yoga) Do Not substitute Alcohol to help with tapering If you have been on opioids for  less than two weeks and do not have pain than it is ok to stop all together.  Plan to wean off of opioids This plan should start within one week post op of your joint replacement. Maintain the same interval or time between taking each dose and first decrease the dose.  Cut the total daily intake of opioids by one tablet each day Next start to increase the time between doses. The last dose that should be eliminated is the evening dose.  DISCHARGE MEDICATIONS:   Allergies as of 03/27/2022       Reactions   Ativan [lorazepam] Other (See Comments)   "Sees things."   Lisinopril Cough        Medication List     TAKE these medications    amLODipine 5 MG tablet Commonly known as: NORVASC Take 5 mg by mouth every morning.   aspirin 81 MG tablet Take 1 tablet (81 mg total) by mouth daily.   clopidogrel 75 MG tablet Commonly known as: PLAVIX Take 75 mg by mouth daily.   Evolocumab 140 MG/ML Sosy Inject 140 mg into the skin every 14 (fourteen) days.   metoprolol tartrate 25 MG tablet Commonly known as: LOPRESSOR Take 1 tablet (25 mg total) by mouth 2 (two) times daily.   nitroGLYCERIN 0.4 MG SL tablet Commonly known as: NITROSTAT Place 1 tablet (0.4 mg total) under the tongue every 5 (five) minutes as needed for chest pain.   oxyCODONE 5 MG immediate release tablet Commonly known as: Oxy IR/ROXICODONE Take 1-2 tablets (5-10 mg total) by mouth every 6 (six) hours as needed for moderate pain (pain score 4-6).   pantoprazole 40 MG tablet Commonly known as: PROTONIX TAKE 1 TABLET BY MOUTH DAILY AT 6:00 AM What changed: See the new instructions.               Durable Medical Equipment  (From admission, onward)           Start     Ordered   03/26/22 1240  DME Walker rolling  Once       Question:  Patient needs a walker to treat with the following condition  Answer:  S/P total knee replacement   03/26/22 1239   03/26/22 1240  DME 3 n 1  Once         03/26/22 1239   03/26/22 1240  DME Bedside commode  Once       Question:  Patient needs a bedside commode to treat with the following condition  Answer:  S/P total knee replacement   03/26/22 1239            FOLLOW UP VISIT:    DISPOSITION: HOME VS. SNF  Dental Antibiotics:  In most cases prophylactic antibiotics for Dental procdeures after total joint surgery are not necessary.  Exceptions are as follows:  1. History of prior total joint infection  2. Severely immunocompromised (Organ Transplant, cancer chemotherapy, Rheumatoid biologic meds such as [place])  3. Poorly controlled diabetes (A1C &gt; 8.0, blood glucose over 200)  If you have one of these conditions, contact your surgeon for an antibiotic prescription, prior to your dental procedure.   CONDITION:  Good   Donia Ast 03/27/2022, 12:54 PM

## 2022-03-27 NOTE — TOC Transition Note (Signed)
Transition of Care [facility]) - CM/SW Discharge Note   Patient Details  Name: [name] [name] MRN: [id number] Date of Birth: Oct 13, 1940  [facility] CM/SW Contact:  Lennart Pall, LCSW Phone Number: 03/27/2022, 10:08 AM   Clinical Narrative:    Met with pt and confirming he has needed DME at home.  OPPT already arranged in [place].  No TOC needs.   Final next level of care: OP Rehab Barriers to Discharge: No Barriers Identified   Patient Goals and CMS Choice      Discharge Placement                         Discharge Plan and Services Additional resources added to the After Visit Summary for                  DME Arranged: N/A DME Agency: NA                  Social Determinants of Health (SDOH) Interventions SDOH Screenings   Food Insecurity: No Food Insecurity (03/26/2022)  Housing: Low Risk  (03/26/2022)  Transportation Needs: No Transportation Needs (03/26/2022)  Utilities: Not At Risk (03/26/2022)  Tobacco Use: Medium Risk (03/26/2022)     Readmission Risk Interventions     No data to display

## 2022-03-27 NOTE — Plan of Care (Signed)
  Problem: Education: Goal: Knowledge of the prescribed therapeutic regimen will improve Outcome: Adequate for Discharge Goal: Individualized Educational Video(s) Outcome: Adequate for Discharge   Problem: Activity: Goal: Ability to avoid complications of mobility impairment will improve 03/27/2022 1313 by Lise Auer, LPN Outcome: Adequate for Discharge 03/27/2022 0939 by Lise Auer, LPN Outcome: Progressing Goal: Range of joint motion will improve Outcome: Adequate for Discharge   Problem: Clinical Measurements: Goal: Postoperative complications will be avoided or minimized Outcome: Adequate for Discharge   Problem: Pain Management: Goal: Pain level will decrease with appropriate interventions 03/27/2022 1313 by Lise Auer, LPN Outcome: Adequate for Discharge 03/27/2022 0939 by Lise Auer, LPN Outcome: Progressing   Problem: Skin Integrity: Goal: Will show signs of wound healing Outcome: Adequate for Discharge   Problem: Education: Goal: Knowledge of General Education information will improve Description: Including pain rating scale, medication(s)/side effects and non-pharmacologic comfort measures Outcome: Adequate for Discharge   Problem: Health Behavior/Discharge Planning: Goal: Ability to manage health-related needs will improve Outcome: Adequate for Discharge   Problem: Clinical Measurements: Goal: Ability to maintain clinical measurements within normal limits will improve Outcome: Adequate for Discharge Goal: Will remain free from infection Outcome: Adequate for Discharge Goal: Diagnostic test results will improve Outcome: Adequate for Discharge Goal: Respiratory complications will improve Outcome: Adequate for Discharge Goal: Cardiovascular complication will be avoided Outcome: Adequate for Discharge   Problem: Activity: Goal: Risk for activity intolerance will decrease 03/27/2022 1313 by Iver Nestle A, LPN Outcome: Adequate for  Discharge 03/27/2022 0939 by Lise Auer, LPN Outcome: Progressing   Problem: Nutrition: Goal: Adequate nutrition will be maintained Outcome: Adequate for Discharge   Problem: Coping: Goal: Level of anxiety will decrease Outcome: Adequate for Discharge   Problem: Elimination: Goal: Will not experience complications related to bowel motility Outcome: Adequate for Discharge Goal: Will not experience complications related to urinary retention Outcome: Adequate for Discharge   Problem: Pain Managment: Goal: General experience of comfort will improve Outcome: Adequate for Discharge   Problem: Safety: Goal: Ability to remain free from injury will improve Outcome: Adequate for Discharge   Problem: Skin Integrity: Goal: Risk for impaired skin integrity will decrease Outcome: Adequate for Discharge

## 2022-03-29 ENCOUNTER — Encounter (HOSPITAL_COMMUNITY): Payer: Self-pay | Admitting: Orthopedic Surgery

## 2022-04-05 DIAGNOSIS — M25561 Pain in right knee: Secondary | ICD-10-CM | POA: Diagnosis not present

## 2022-04-05 DIAGNOSIS — Z471 Aftercare following joint replacement surgery: Secondary | ICD-10-CM | POA: Diagnosis not present

## 2022-04-05 DIAGNOSIS — Z96651 Presence of right artificial knee joint: Secondary | ICD-10-CM | POA: Diagnosis not present

## 2022-04-05 DIAGNOSIS — R2689 Other abnormalities of gait and mobility: Secondary | ICD-10-CM | POA: Diagnosis not present

## 2022-04-09 DIAGNOSIS — R2689 Other abnormalities of gait and mobility: Secondary | ICD-10-CM | POA: Diagnosis not present

## 2022-04-09 DIAGNOSIS — M25561 Pain in right knee: Secondary | ICD-10-CM | POA: Diagnosis not present

## 2022-04-09 DIAGNOSIS — Z96651 Presence of right artificial knee joint: Secondary | ICD-10-CM | POA: Diagnosis not present

## 2022-04-11 DIAGNOSIS — R2689 Other abnormalities of gait and mobility: Secondary | ICD-10-CM | POA: Diagnosis not present

## 2022-04-11 DIAGNOSIS — M25561 Pain in right knee: Secondary | ICD-10-CM | POA: Diagnosis not present

## 2022-04-11 DIAGNOSIS — Z96651 Presence of right artificial knee joint: Secondary | ICD-10-CM | POA: Diagnosis not present

## 2022-04-13 DIAGNOSIS — M25561 Pain in right knee: Secondary | ICD-10-CM | POA: Diagnosis not present

## 2022-04-13 DIAGNOSIS — R2689 Other abnormalities of gait and mobility: Secondary | ICD-10-CM | POA: Diagnosis not present

## 2022-04-13 DIAGNOSIS — Z96651 Presence of right artificial knee joint: Secondary | ICD-10-CM | POA: Diagnosis not present

## 2022-04-16 DIAGNOSIS — R2689 Other abnormalities of gait and mobility: Secondary | ICD-10-CM | POA: Diagnosis not present

## 2022-04-16 DIAGNOSIS — M25561 Pain in right knee: Secondary | ICD-10-CM | POA: Diagnosis not present

## 2022-04-16 DIAGNOSIS — Z96651 Presence of right artificial knee joint: Secondary | ICD-10-CM | POA: Diagnosis not present

## 2022-04-18 DIAGNOSIS — Z96651 Presence of right artificial knee joint: Secondary | ICD-10-CM | POA: Diagnosis not present

## 2022-04-18 DIAGNOSIS — R2689 Other abnormalities of gait and mobility: Secondary | ICD-10-CM | POA: Diagnosis not present

## 2022-04-18 DIAGNOSIS — M25561 Pain in right knee: Secondary | ICD-10-CM | POA: Diagnosis not present

## 2022-04-19 DIAGNOSIS — M25561 Pain in right knee: Secondary | ICD-10-CM | POA: Diagnosis not present

## 2022-04-19 DIAGNOSIS — Z96651 Presence of right artificial knee joint: Secondary | ICD-10-CM | POA: Diagnosis not present

## 2022-04-19 DIAGNOSIS — R2689 Other abnormalities of gait and mobility: Secondary | ICD-10-CM | POA: Diagnosis not present

## 2022-04-23 DIAGNOSIS — R2689 Other abnormalities of gait and mobility: Secondary | ICD-10-CM | POA: Diagnosis not present

## 2022-04-23 DIAGNOSIS — Z96651 Presence of right artificial knee joint: Secondary | ICD-10-CM | POA: Diagnosis not present

## 2022-04-23 DIAGNOSIS — M25561 Pain in right knee: Secondary | ICD-10-CM | POA: Diagnosis not present

## 2022-04-26 DIAGNOSIS — R2689 Other abnormalities of gait and mobility: Secondary | ICD-10-CM | POA: Diagnosis not present

## 2022-04-26 DIAGNOSIS — M25561 Pain in right knee: Secondary | ICD-10-CM | POA: Diagnosis not present

## 2022-04-26 DIAGNOSIS — Z96651 Presence of right artificial knee joint: Secondary | ICD-10-CM | POA: Diagnosis not present

## 2022-04-30 DIAGNOSIS — Z96651 Presence of right artificial knee joint: Secondary | ICD-10-CM | POA: Diagnosis not present

## 2022-04-30 DIAGNOSIS — M25561 Pain in right knee: Secondary | ICD-10-CM | POA: Diagnosis not present

## 2022-04-30 DIAGNOSIS — R2689 Other abnormalities of gait and mobility: Secondary | ICD-10-CM | POA: Diagnosis not present

## 2022-05-03 DIAGNOSIS — M25561 Pain in right knee: Secondary | ICD-10-CM | POA: Diagnosis not present

## 2022-05-03 DIAGNOSIS — R2689 Other abnormalities of gait and mobility: Secondary | ICD-10-CM | POA: Diagnosis not present

## 2022-05-03 DIAGNOSIS — Z96651 Presence of right artificial knee joint: Secondary | ICD-10-CM | POA: Diagnosis not present

## 2022-05-07 DIAGNOSIS — R2689 Other abnormalities of gait and mobility: Secondary | ICD-10-CM | POA: Diagnosis not present

## 2022-05-07 DIAGNOSIS — M25561 Pain in right knee: Secondary | ICD-10-CM | POA: Diagnosis not present

## 2022-05-07 DIAGNOSIS — Z96651 Presence of right artificial knee joint: Secondary | ICD-10-CM | POA: Diagnosis not present

## 2022-05-10 DIAGNOSIS — M25561 Pain in right knee: Secondary | ICD-10-CM | POA: Diagnosis not present

## 2022-05-10 DIAGNOSIS — R2689 Other abnormalities of gait and mobility: Secondary | ICD-10-CM | POA: Diagnosis not present

## 2022-05-10 DIAGNOSIS — Z96651 Presence of right artificial knee joint: Secondary | ICD-10-CM | POA: Diagnosis not present

## 2022-05-14 DIAGNOSIS — Z96651 Presence of right artificial knee joint: Secondary | ICD-10-CM | POA: Diagnosis not present

## 2022-05-14 DIAGNOSIS — R2689 Other abnormalities of gait and mobility: Secondary | ICD-10-CM | POA: Diagnosis not present

## 2022-05-14 DIAGNOSIS — M25561 Pain in right knee: Secondary | ICD-10-CM | POA: Diagnosis not present

## 2022-05-17 DIAGNOSIS — Z96651 Presence of right artificial knee joint: Secondary | ICD-10-CM | POA: Diagnosis not present

## 2022-05-17 DIAGNOSIS — M25561 Pain in right knee: Secondary | ICD-10-CM | POA: Diagnosis not present

## 2022-05-17 DIAGNOSIS — R2689 Other abnormalities of gait and mobility: Secondary | ICD-10-CM | POA: Diagnosis not present

## 2022-05-28 DIAGNOSIS — Z96651 Presence of right artificial knee joint: Secondary | ICD-10-CM | POA: Diagnosis not present

## 2022-05-28 DIAGNOSIS — M25561 Pain in right knee: Secondary | ICD-10-CM | POA: Diagnosis not present

## 2022-05-28 DIAGNOSIS — R2689 Other abnormalities of gait and mobility: Secondary | ICD-10-CM | POA: Diagnosis not present

## 2022-07-19 DIAGNOSIS — Z96651 Presence of right artificial knee joint: Secondary | ICD-10-CM | POA: Diagnosis not present

## 2022-08-01 DIAGNOSIS — Z6828 Body mass index (BMI) 28.0-28.9, adult: Secondary | ICD-10-CM | POA: Diagnosis not present

## 2022-08-01 DIAGNOSIS — I10 Essential (primary) hypertension: Secondary | ICD-10-CM | POA: Diagnosis not present

## 2022-08-01 DIAGNOSIS — E782 Mixed hyperlipidemia: Secondary | ICD-10-CM | POA: Diagnosis not present

## 2022-08-01 DIAGNOSIS — K219 Gastro-esophageal reflux disease without esophagitis: Secondary | ICD-10-CM | POA: Diagnosis not present

## 2022-08-01 DIAGNOSIS — E041 Nontoxic single thyroid nodule: Secondary | ICD-10-CM | POA: Diagnosis not present

## 2022-08-01 DIAGNOSIS — H6123 Impacted cerumen, bilateral: Secondary | ICD-10-CM | POA: Diagnosis not present

## 2022-08-01 DIAGNOSIS — I2510 Atherosclerotic heart disease of native coronary artery without angina pectoris: Secondary | ICD-10-CM | POA: Diagnosis not present

## 2022-08-01 DIAGNOSIS — I252 Old myocardial infarction: Secondary | ICD-10-CM | POA: Diagnosis not present

## 2022-08-01 DIAGNOSIS — D692 Other nonthrombocytopenic purpura: Secondary | ICD-10-CM | POA: Diagnosis not present

## 2022-08-22 DIAGNOSIS — Z0000 Encounter for general adult medical examination without abnormal findings: Secondary | ICD-10-CM | POA: Diagnosis not present

## 2022-08-22 DIAGNOSIS — Z9181 History of falling: Secondary | ICD-10-CM | POA: Diagnosis not present

## 2022-10-05 DIAGNOSIS — L820 Inflamed seborrheic keratosis: Secondary | ICD-10-CM | POA: Diagnosis not present

## 2022-11-18 NOTE — Progress Notes (Unsigned)
Cardiology Office Note:    Date:  11/19/2022   ID:  [name] [name], DOB [birth year], MRN [id number]  PCP:  Wilmer Floor., MD  Cardiologist:  Norman Herrlich, MD    Referring MD: Wilmer Floor., MD    ASSESSMENT:    1. Coronary artery disease involving native coronary artery of native heart with unstable angina pectoris (HCC)   2. Essential hypertension   3. Pure hypercholesterolemia   4. Statin intolerance    PLAN:    In order of problems listed above:  Ananda continues to do well with CAD remains asymptomatic following remote PCI most recently 2019continue his current medical therapy with aspirin calcium channel blocker clopidogrel and restart Repatha.  Advise an ischemia evaluation at this time I would not Hypertension well-controlled continue his calcium channel blocker beta-blocker Resume lipid-lowering therapy Repatha prescription be sent to [facility] pharmacy  Next appointment:  1 year   Medication Adjustments/Labs and Tests Ordered: Current medicines are reviewed at length with the patient today.  Concerns regarding medicines are outlined above.  Orders Placed This Encounter  Procedures   EKG 12-Lead   No orders of the defined types were placed in this encounter.    History of Present Illness:    [name] [name] is a 82 y.o. male with a hx of CAD with PCI and stent left circumflex March 2009 normal PCI and stent to the mid left circumflex and first marginal branch February 2019 hypertension symptomatic orthostatic hypotension and hyperlipidemia last seen 11/01/2021.  Lipid profile 08/02/2022, LDL 48 non-HDL cholesterol 67 total cholesterol 108 HDL 41 triglycerides 98  Compliance with diet, lifestyle and medications: Yes  He continues to do well remains employed in flooring carpet active and is having no angina edema shortness of breath chest pain palpitation or syncope He request his Repatha to be sent to the [facility] pharmacy for benefits Currently is not  taking lipid-lowering therapy and is statin intolerant His last lipid profile 08/01/2022 LDL 48 cholesterol 108 triglycerides 98 creatinine 0.96 potassium 4.1 hemoglobin 14.4 Past Medical History:  Diagnosis Date   Acute myocardial infarction, subendocardial infarction, episode of care unspecified    Arthritis    "left pinky" (03/04/2017)   Cerebral vascular malformation    PT'S NEUROLOGIST IS DR. 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 s/p stent to Cx 2009. b. recent NSTEMI s/p DES to mLCx and m/dRCA.   Coronary artery disease involving native coronary artery of native heart with unstable angina pectoris (HCC)    Essential hypertension 03/06/2017   Gallstones 08/07/2012   GERD (gastroesophageal reflux disease)    Headache(784.0)    POSSIBLE TRIGEMINAL NEURALGIA    History of MI (myocardial infarction)    Hyperlipidemia    Hypertension    Mass of right kidney    FOUND ON WORK UP FOR ABD PAIN   Murmur    Myocardial infarction [facility] 04-14-07   NSTEMI (non-ST elevated myocardial infarction) (HCC) 03/04/2017   Hattie Perch 03/04/2017   Prostate cancer (HCC)    PVC's (premature ventricular contractions)    PVCs (premature ventricular contractions) 03/15/2017   Umbilical hernia    Unstable angina (HCC) 03/22/2017    Current Medications: Current Meds  Medication Sig   amLODipine (NORVASC) 5 MG tablet Take 5 mg by mouth every morning.   aspirin 81 MG tablet Take 1 tablet (81 mg total) by mouth  pressure was moderately increased. PA peak pressure: 40 mm Hg (S).  ------------------------------------------------------------------- Study data:  No prior study was available for comparison.  Study status:  Routine.  Procedure:  Transthoracic echocardiography. Image quality was adequate.  Study completion:  There were no complications.          Transthoracic echocardiography.  M-mode, complete 2D, spectral Doppler, and color Doppler.  Birthdate: Patient birthdate: 1940-08-22.  Age:  Patient is 82 yr old.  Sex: Gender: male.    BMI: 26.6 kg/m^2.  Blood pressure:     142/72 Patient status:  Inpatient.  Study date:  Study date: 03/06/2017. Study time: 07:53 AM.  Location:  Bedside.  -------------------------------------------------------------------  ------------------------------------------------------------------- Left ventricle:  The cavity size was normal. Wall thickness was increased in a pattern of mild  LVH. Systolic function was normal. The estimated ejection fraction was in the range of 50% to 55%. Wall motion was normal; there were no regional wall motion abnormalities. Doppler parameters are consistent with abnormal left ventricular relaxation (grade 1 diastolic dysfunction).  ------------------------------------------------------------------- Aortic valve:   Structurally normal valve.   Cusp separation was normal.  Doppler:  Transvalvular velocity was within the normal range. There was no stenosis. There was trivial regurgitation.  ------------------------------------------------------------------- Aorta:  Aortic root: The aortic root was normal in size. Ascending aorta: The ascending aorta was normal in size.  ------------------------------------------------------------------- Mitral valve:   Moderately thickened, moderately calcified leaflets .  Doppler:  There was no significant regurgitation.  ------------------------------------------------------------------- Left atrium:  The atrium was normal in size.  ------------------------------------------------------------------- Right ventricle:  The cavity size was normal. Systolic function was normal.  ------------------------------------------------------------------- Pulmonic valve:    The valve appears to be grossly normal. Doppler:  There was no significant regurgitation.  ------------------------------------------------------------------- Tricuspid valve:   The valve appears to be grossly normal. Doppler:  There was mild-moderate regurgitation.  ------------------------------------------------------------------- Pulmonary artery:   Systolic pressure was moderately increased.  ------------------------------------------------------------------- Right atrium:  The atrium was moderately dilated.  ------------------------------------------------------------------- Pericardium:  There was no pericardial  effusion.  ------------------------------------------------------------------- Measurements  Left ventricle                          Value        Reference LV ID, ED, PLAX chordal         (L)     42.2  mm     43 - 52 LV ID, ES, PLAX chordal                 24.9  mm     23 - 38 LV fx shortening, PLAX chordal          41    %      >=29 LV PW thickness, ED                     10.6  mm     ---------- IVS/LV PW ratio, ED                     1.25         <=1.3 Stroke volume, 2D                       82    ml     ---------- Stroke volume/bsa, 2D                   36    ml/m^2 ----------  pressure was moderately increased. PA peak pressure: 40 mm Hg (S).  ------------------------------------------------------------------- Study data:  No prior study was available for comparison.  Study status:  Routine.  Procedure:  Transthoracic echocardiography. Image quality was adequate.  Study completion:  There were no complications.          Transthoracic echocardiography.  M-mode, complete 2D, spectral Doppler, and color Doppler.  Birthdate: Patient birthdate: 1940-08-22.  Age:  Patient is 82 yr old.  Sex: Gender: male.    BMI: 26.6 kg/m^2.  Blood pressure:     142/72 Patient status:  Inpatient.  Study date:  Study date: 03/06/2017. Study time: 07:53 AM.  Location:  Bedside.  -------------------------------------------------------------------  ------------------------------------------------------------------- Left ventricle:  The cavity size was normal. Wall thickness was increased in a pattern of mild  LVH. Systolic function was normal. The estimated ejection fraction was in the range of 50% to 55%. Wall motion was normal; there were no regional wall motion abnormalities. Doppler parameters are consistent with abnormal left ventricular relaxation (grade 1 diastolic dysfunction).  ------------------------------------------------------------------- Aortic valve:   Structurally normal valve.   Cusp separation was normal.  Doppler:  Transvalvular velocity was within the normal range. There was no stenosis. There was trivial regurgitation.  ------------------------------------------------------------------- Aorta:  Aortic root: The aortic root was normal in size. Ascending aorta: The ascending aorta was normal in size.  ------------------------------------------------------------------- Mitral valve:   Moderately thickened, moderately calcified leaflets .  Doppler:  There was no significant regurgitation.  ------------------------------------------------------------------- Left atrium:  The atrium was normal in size.  ------------------------------------------------------------------- Right ventricle:  The cavity size was normal. Systolic function was normal.  ------------------------------------------------------------------- Pulmonic valve:    The valve appears to be grossly normal. Doppler:  There was no significant regurgitation.  ------------------------------------------------------------------- Tricuspid valve:   The valve appears to be grossly normal. Doppler:  There was mild-moderate regurgitation.  ------------------------------------------------------------------- Pulmonary artery:   Systolic pressure was moderately increased.  ------------------------------------------------------------------- Right atrium:  The atrium was moderately dilated.  ------------------------------------------------------------------- Pericardium:  There was no pericardial  effusion.  ------------------------------------------------------------------- Measurements  Left ventricle                          Value        Reference LV ID, ED, PLAX chordal         (L)     42.2  mm     43 - 52 LV ID, ES, PLAX chordal                 24.9  mm     23 - 38 LV fx shortening, PLAX chordal          41    %      >=29 LV PW thickness, ED                     10.6  mm     ---------- IVS/LV PW ratio, ED                     1.25         <=1.3 Stroke volume, 2D                       82    ml     ---------- Stroke volume/bsa, 2D                   36    ml/m^2 ----------  pressure was moderately increased. PA peak pressure: 40 mm Hg (S).  ------------------------------------------------------------------- Study data:  No prior study was available for comparison.  Study status:  Routine.  Procedure:  Transthoracic echocardiography. Image quality was adequate.  Study completion:  There were no complications.          Transthoracic echocardiography.  M-mode, complete 2D, spectral Doppler, and color Doppler.  Birthdate: Patient birthdate: 1940-08-22.  Age:  Patient is 82 yr old.  Sex: Gender: male.    BMI: 26.6 kg/m^2.  Blood pressure:     142/72 Patient status:  Inpatient.  Study date:  Study date: 03/06/2017. Study time: 07:53 AM.  Location:  Bedside.  -------------------------------------------------------------------  ------------------------------------------------------------------- Left ventricle:  The cavity size was normal. Wall thickness was increased in a pattern of mild  LVH. Systolic function was normal. The estimated ejection fraction was in the range of 50% to 55%. Wall motion was normal; there were no regional wall motion abnormalities. Doppler parameters are consistent with abnormal left ventricular relaxation (grade 1 diastolic dysfunction).  ------------------------------------------------------------------- Aortic valve:   Structurally normal valve.   Cusp separation was normal.  Doppler:  Transvalvular velocity was within the normal range. There was no stenosis. There was trivial regurgitation.  ------------------------------------------------------------------- Aorta:  Aortic root: The aortic root was normal in size. Ascending aorta: The ascending aorta was normal in size.  ------------------------------------------------------------------- Mitral valve:   Moderately thickened, moderately calcified leaflets .  Doppler:  There was no significant regurgitation.  ------------------------------------------------------------------- Left atrium:  The atrium was normal in size.  ------------------------------------------------------------------- Right ventricle:  The cavity size was normal. Systolic function was normal.  ------------------------------------------------------------------- Pulmonic valve:    The valve appears to be grossly normal. Doppler:  There was no significant regurgitation.  ------------------------------------------------------------------- Tricuspid valve:   The valve appears to be grossly normal. Doppler:  There was mild-moderate regurgitation.  ------------------------------------------------------------------- Pulmonary artery:   Systolic pressure was moderately increased.  ------------------------------------------------------------------- Right atrium:  The atrium was moderately dilated.  ------------------------------------------------------------------- Pericardium:  There was no pericardial  effusion.  ------------------------------------------------------------------- Measurements  Left ventricle                          Value        Reference LV ID, ED, PLAX chordal         (L)     42.2  mm     43 - 52 LV ID, ES, PLAX chordal                 24.9  mm     23 - 38 LV fx shortening, PLAX chordal          41    %      >=29 LV PW thickness, ED                     10.6  mm     ---------- IVS/LV PW ratio, ED                     1.25         <=1.3 Stroke volume, 2D                       82    ml     ---------- Stroke volume/bsa, 2D                   36    ml/m^2 ----------  Cardiology Office Note:    Date:  11/19/2022   ID:  [name] [name], DOB [birth year], MRN [id number]  PCP:  Wilmer Floor., MD  Cardiologist:  Norman Herrlich, MD    Referring MD: Wilmer Floor., MD    ASSESSMENT:    1. Coronary artery disease involving native coronary artery of native heart with unstable angina pectoris (HCC)   2. Essential hypertension   3. Pure hypercholesterolemia   4. Statin intolerance    PLAN:    In order of problems listed above:  Ananda continues to do well with CAD remains asymptomatic following remote PCI most recently 2019continue his current medical therapy with aspirin calcium channel blocker clopidogrel and restart Repatha.  Advise an ischemia evaluation at this time I would not Hypertension well-controlled continue his calcium channel blocker beta-blocker Resume lipid-lowering therapy Repatha prescription be sent to [facility] pharmacy  Next appointment:  1 year   Medication Adjustments/Labs and Tests Ordered: Current medicines are reviewed at length with the patient today.  Concerns regarding medicines are outlined above.  Orders Placed This Encounter  Procedures   EKG 12-Lead   No orders of the defined types were placed in this encounter.    History of Present Illness:    [name] [name] is a 82 y.o. male with a hx of CAD with PCI and stent left circumflex March 2009 normal PCI and stent to the mid left circumflex and first marginal branch February 2019 hypertension symptomatic orthostatic hypotension and hyperlipidemia last seen 11/01/2021.  Lipid profile 08/02/2022, LDL 48 non-HDL cholesterol 67 total cholesterol 108 HDL 41 triglycerides 98  Compliance with diet, lifestyle and medications: Yes  He continues to do well remains employed in flooring carpet active and is having no angina edema shortness of breath chest pain palpitation or syncope He request his Repatha to be sent to the [facility] pharmacy for benefits Currently is not  taking lipid-lowering therapy and is statin intolerant His last lipid profile 08/01/2022 LDL 48 cholesterol 108 triglycerides 98 creatinine 0.96 potassium 4.1 hemoglobin 14.4 Past Medical History:  Diagnosis Date   Acute myocardial infarction, subendocardial infarction, episode of care unspecified    Arthritis    "left pinky" (03/04/2017)   Cerebral vascular malformation    PT'S NEUROLOGIST IS DR. 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 s/p stent to Cx 2009. b. recent NSTEMI s/p DES to mLCx and m/dRCA.   Coronary artery disease involving native coronary artery of native heart with unstable angina pectoris (HCC)    Essential hypertension 03/06/2017   Gallstones 08/07/2012   GERD (gastroesophageal reflux disease)    Headache(784.0)    POSSIBLE TRIGEMINAL NEURALGIA    History of MI (myocardial infarction)    Hyperlipidemia    Hypertension    Mass of right kidney    FOUND ON WORK UP FOR ABD PAIN   Murmur    Myocardial infarction [facility]) 04-14-07   NSTEMI (non-ST elevated myocardial infarction) (HCC) 03/04/2017   Hattie Perch 03/04/2017   Prostate cancer (HCC)    PVC's (premature ventricular contractions)    PVCs (premature ventricular contractions) 03/15/2017   Umbilical hernia    Unstable angina (HCC) 03/22/2017    Current Medications: Current Meds  Medication Sig   amLODipine (NORVASC) 5 MG tablet Take 5 mg by mouth every morning.   aspirin 81 MG tablet Take 1 tablet (81 mg total) by mouth  daily.   clopidogrel (PLAVIX) 75 MG tablet Take 75 mg by mouth daily.   Evolocumab 140 MG/ML SOSY Inject 140 mg into the skin every 14 (fourteen) days.   metoprolol tartrate (LOPRESSOR) 25 MG tablet Take 1 tablet (25 mg total) by mouth 2 (two) times daily.   nitroGLYCERIN (NITROSTAT) 0.4 MG SL tablet Place 1 tablet (0.4 mg total) under the tongue every 5 (five) minutes as needed for chest pain.   oxyCODONE  (OXY IR/ROXICODONE) 5 MG immediate release tablet Take 1-2 tablets (5-10 mg total) by mouth every 6 (six) hours as needed for moderate pain (pain score 4-6).   pantoprazole (PROTONIX) 40 MG tablet TAKE 1 TABLET BY MOUTH DAILY AT 6:00 AM (Patient taking differently: Take 40 mg by mouth daily.)      EKGs/Labs/Other Studies Reviewed:    The following studies were reviewed today:  Cardiac Studies & Procedures   CARDIAC CATHETERIZATION  CARDIAC CATHETERIZATION 03/25/2017  Narrative Images from the original result were not included.   Previously placed Mid RCA stent (unknown type) is widely patent.  Previously placed Prox Cx to Mid Cx stent (unknown type) is widely patent.  Prox Cx lesion is 50% stenosed.  Prox LAD lesion is 30% stenosed.  The left ventricular systolic function is normal.  LV end diastolic pressure is normal.  The left ventricular ejection fraction is 55-65% by visual estimate.  [name] [name] is a 82 y.o. male   [id number] LOCATION:  FACILITY: [facility] PHYSICIAN: Nanetta Batty, M.D. [birth year]   DATE OF PROCEDURE:  03/25/2017  DATE OF DISCHARGE:     CARDIAC CATHETERIZATION    History obtained from chart review.82 y.o. male with history of CAD (s/p stent to Cx 2009, recent NSTEMI s/p DES to mLCx and m/dRCA), HTN, HLD (previous refusal of statins until recently), prostate CA, PVCs, remote tobacco abuse who presented back to the hospital with recurrent CP. Transferred from [place] to [place] for further work up.  Impression Mr. Kellom had  patent stents in the circumflex and RCA. There is a 50% stenosis in the mid AV groove circumflex just proximal to the stented segment but this  did not appear to be hemodynamically significant. The LAD was 40% segmentally  Stenosed which was  unchanged from the prior caths. LV Function was normal. I could not identify a culprit lesion". Medical therapy recommended. The patient removed and pressure held on the groin to achieve  hemostasis. The patient left the lab in stable condition. He can be discharged home tomorrow. Follow-up with Dr. Dulce Sellar.  Nanetta Batty. MD, [facility] 03/25/2017 3:48 PM  Findings Coronary Findings Diagnostic  Dominance: Right  Left Anterior Descending Prox LAD lesion is 30% stenosed.  Left Circumflex Prox Cx lesion is 50% stenosed. Previously placed Prox Cx to Mid Cx stent (unknown type) is widely patent.  Right Coronary Artery Previously placed Mid RCA stent (unknown type) is widely patent.  Intervention  No interventions have been documented.   CARDIAC CATHETERIZATION  CARDIAC CATHETERIZATION 03/05/2017  Narrative  Mid LM lesion is 25% stenosed.  Prox LAD to Mid LAD lesion is 55% stenosed.  Ost 1st Mrg lesion is 40% stenosed.  1st Mrg lesion is 95% stenosed.  A drug-eluting stent was successfully placed using a STENT SYNERGY DES 3.5X16.  Post intervention, there is a 0% residual stenosis.  Mid RCA to Dist RCA lesion is 75% stenosed.  A drug-eluting stent was successfully placed using a STENT SYNERGY DES 2.75X12.  Post intervention, there is a 0% residual  daily.   clopidogrel (PLAVIX) 75 MG tablet Take 75 mg by mouth daily.   Evolocumab 140 MG/ML SOSY Inject 140 mg into the skin every 14 (fourteen) days.   metoprolol tartrate (LOPRESSOR) 25 MG tablet Take 1 tablet (25 mg total) by mouth 2 (two) times daily.   nitroGLYCERIN (NITROSTAT) 0.4 MG SL tablet Place 1 tablet (0.4 mg total) under the tongue every 5 (five) minutes as needed for chest pain.   oxyCODONE  (OXY IR/ROXICODONE) 5 MG immediate release tablet Take 1-2 tablets (5-10 mg total) by mouth every 6 (six) hours as needed for moderate pain (pain score 4-6).   pantoprazole (PROTONIX) 40 MG tablet TAKE 1 TABLET BY MOUTH DAILY AT 6:00 AM (Patient taking differently: Take 40 mg by mouth daily.)      EKGs/Labs/Other Studies Reviewed:    The following studies were reviewed today:  Cardiac Studies & Procedures   CARDIAC CATHETERIZATION  CARDIAC CATHETERIZATION 03/25/2017  Narrative Images from the original result were not included.   Previously placed Mid RCA stent (unknown type) is widely patent.  Previously placed Prox Cx to Mid Cx stent (unknown type) is widely patent.  Prox Cx lesion is 50% stenosed.  Prox LAD lesion is 30% stenosed.  The left ventricular systolic function is normal.  LV end diastolic pressure is normal.  The left ventricular ejection fraction is 55-65% by visual estimate.  [name] [name] is a 82 y.o. male   [id number] LOCATION:  FACILITY: [facility] PHYSICIAN: Nanetta Batty, M.D. [birth year]   DATE OF PROCEDURE:  03/25/2017  DATE OF DISCHARGE:     CARDIAC CATHETERIZATION    History obtained from chart review.82 y.o. male with history of CAD (s/p stent to Cx 2009, recent NSTEMI s/p DES to mLCx and m/dRCA), HTN, HLD (previous refusal of statins until recently), prostate CA, PVCs, remote tobacco abuse who presented back to the hospital with recurrent CP. Transferred from [place] to [place] for further work up.  Impression Mr. Kellom had  patent stents in the circumflex and RCA. There is a 50% stenosis in the mid AV groove circumflex just proximal to the stented segment but this  did not appear to be hemodynamically significant. The LAD was 40% segmentally  Stenosed which was  unchanged from the prior caths. LV Function was normal. I could not identify a culprit lesion". Medical therapy recommended. The patient removed and pressure held on the groin to achieve  hemostasis. The patient left the lab in stable condition. He can be discharged home tomorrow. Follow-up with Dr. Dulce Sellar.  Nanetta Batty. MD, [facility] 03/25/2017 3:48 PM  Findings Coronary Findings Diagnostic  Dominance: Right  Left Anterior Descending Prox LAD lesion is 30% stenosed.  Left Circumflex Prox Cx lesion is 50% stenosed. Previously placed Prox Cx to Mid Cx stent (unknown type) is widely patent.  Right Coronary Artery Previously placed Mid RCA stent (unknown type) is widely patent.  Intervention  No interventions have been documented.   CARDIAC CATHETERIZATION  CARDIAC CATHETERIZATION 03/05/2017  Narrative  Mid LM lesion is 25% stenosed.  Prox LAD to Mid LAD lesion is 55% stenosed.  Ost 1st Mrg lesion is 40% stenosed.  1st Mrg lesion is 95% stenosed.  A drug-eluting stent was successfully placed using a STENT SYNERGY DES 3.5X16.  Post intervention, there is a 0% residual stenosis.  Mid RCA to Dist RCA lesion is 75% stenosed.  A drug-eluting stent was successfully placed using a STENT SYNERGY DES 2.75X12.  Post intervention, there is a 0% residual

## 2022-11-19 ENCOUNTER — Ambulatory Visit: Payer: PPO | Attending: Cardiology | Admitting: Cardiology

## 2022-11-19 ENCOUNTER — Encounter: Payer: Self-pay | Admitting: Cardiology

## 2022-11-19 VITALS — BP 118/80 | HR 59 | Ht 75.0 in | Wt 204.0 lb

## 2022-11-19 DIAGNOSIS — E7800 Pure hypercholesterolemia, unspecified: Secondary | ICD-10-CM | POA: Diagnosis not present

## 2022-11-19 DIAGNOSIS — Z789 Other specified health status: Secondary | ICD-10-CM | POA: Diagnosis not present

## 2022-11-19 DIAGNOSIS — I10 Essential (primary) hypertension: Secondary | ICD-10-CM | POA: Diagnosis not present

## 2022-11-19 DIAGNOSIS — I25110 Atherosclerotic heart disease of native coronary artery with unstable angina pectoris: Secondary | ICD-10-CM

## 2022-11-19 NOTE — Patient Instructions (Signed)
Medication Instructions:  Your physician recommends that you continue on your current medications as directed. Please refer to the Current Medication list given to you today.  *If you need a refill on your cardiac medications before your next appointment, please call your pharmacy*   Lab Work: Your physician recommends that you return for lab work in:   Labs in January 2025: CMP, Lipid  If you have labs (blood work) drawn today and your tests are completely normal, you will receive your results only by: MyChart Message (if you have MyChart) OR A paper copy in the mail If you have any lab test that is abnormal or we need to change your treatment, we will call you to review the results.   Testing/Procedures: None   Follow-Up: At [facility], you and your health needs are our priority.  As part of our continuing mission to provide you with exceptional heart care, we have created designated Provider Care Teams.  These Care Teams include your primary Cardiologist (physician) and Advanced Practice Providers (APPs -  Physician Assistants and Nurse Practitioners) who all work together to provide you with the care you need, when you need it.  We recommend signing up for the patient portal called "MyChart".  Sign up information is provided on this After Visit Summary.  MyChart is used to connect with patients for Virtual Visits (Telemedicine).  Patients are able to view lab/test results, encounter notes, upcoming appointments, etc.  Non-urgent messages can be sent to your provider as well.   To learn more about what you can do with MyChart, go to [email or web address].    Your next appointment:   1 year(s)  Provider:   Norman Herrlich, MD    Other Instructions None

## 2023-02-05 DIAGNOSIS — E041 Nontoxic single thyroid nodule: Secondary | ICD-10-CM | POA: Diagnosis not present

## 2023-02-05 DIAGNOSIS — I10 Essential (primary) hypertension: Secondary | ICD-10-CM | POA: Diagnosis not present

## 2023-02-05 DIAGNOSIS — K219 Gastro-esophageal reflux disease without esophagitis: Secondary | ICD-10-CM | POA: Diagnosis not present

## 2023-02-05 DIAGNOSIS — I252 Old myocardial infarction: Secondary | ICD-10-CM | POA: Diagnosis not present

## 2023-02-05 DIAGNOSIS — E782 Mixed hyperlipidemia: Secondary | ICD-10-CM | POA: Diagnosis not present

## 2023-02-05 DIAGNOSIS — D692 Other nonthrombocytopenic purpura: Secondary | ICD-10-CM | POA: Diagnosis not present

## 2023-02-05 DIAGNOSIS — I2510 Atherosclerotic heart disease of native coronary artery without angina pectoris: Secondary | ICD-10-CM | POA: Diagnosis not present

## 2023-02-05 DIAGNOSIS — Z6825 Body mass index (BMI) 25.0-25.9, adult: Secondary | ICD-10-CM | POA: Diagnosis not present

## 2023-02-05 DIAGNOSIS — Z139 Encounter for screening, unspecified: Secondary | ICD-10-CM | POA: Diagnosis not present

## 2023-02-14 ENCOUNTER — Other Ambulatory Visit: Payer: Self-pay

## 2023-02-14 ENCOUNTER — Telehealth: Payer: Self-pay | Admitting: Cardiology

## 2023-02-14 MED ORDER — EVOLOCUMAB 140 MG/ML ~~LOC~~ SOSY: 140.0000 mg | PREFILLED_SYRINGE | SUBCUTANEOUS | 12 refills | Status: AC

## 2023-02-14 NOTE — Telephone Encounter (Signed)
Called patient's daughter Netty Starring and informed her that the patient's paper prescription for Repatha was at the front desk for them to pick up. Beth Ann verbalized understanding and had no further questions at this time.

## 2023-02-14 NOTE — Telephone Encounter (Signed)
Patient needs rx for Repatha- says he was supposed to get one last time- he wants a printed rx he can take to the [place]- patient has an appt there tomorrow.   Marked high priority because patient would like an answer today since he has an appt in the morning over there.   [phone] or [phone]

## 2023-04-09 DIAGNOSIS — Z96651 Presence of right artificial knee joint: Secondary | ICD-10-CM | POA: Diagnosis not present

## 2023-08-06 DIAGNOSIS — E782 Mixed hyperlipidemia: Secondary | ICD-10-CM | POA: Diagnosis not present

## 2023-08-06 DIAGNOSIS — Z139 Encounter for screening, unspecified: Secondary | ICD-10-CM | POA: Diagnosis not present

## 2023-08-06 DIAGNOSIS — D692 Other nonthrombocytopenic purpura: Secondary | ICD-10-CM | POA: Diagnosis not present

## 2023-08-06 DIAGNOSIS — Z6825 Body mass index (BMI) 25.0-25.9, adult: Secondary | ICD-10-CM | POA: Diagnosis not present

## 2023-08-06 DIAGNOSIS — I10 Essential (primary) hypertension: Secondary | ICD-10-CM | POA: Diagnosis not present

## 2023-08-06 DIAGNOSIS — I252 Old myocardial infarction: Secondary | ICD-10-CM | POA: Diagnosis not present

## 2023-08-06 DIAGNOSIS — I2510 Atherosclerotic heart disease of native coronary artery without angina pectoris: Secondary | ICD-10-CM | POA: Diagnosis not present

## 2023-08-06 DIAGNOSIS — E041 Nontoxic single thyroid nodule: Secondary | ICD-10-CM | POA: Diagnosis not present

## 2023-08-06 DIAGNOSIS — K219 Gastro-esophageal reflux disease without esophagitis: Secondary | ICD-10-CM | POA: Diagnosis not present

## 2023-11-19 DIAGNOSIS — H903 Sensorineural hearing loss, bilateral: Secondary | ICD-10-CM | POA: Insufficient documentation

## 2023-11-19 DIAGNOSIS — M1990 Unspecified osteoarthritis, unspecified site: Secondary | ICD-10-CM | POA: Insufficient documentation

## 2023-11-19 DIAGNOSIS — Z461 Encounter for fitting and adjustment of hearing aid: Secondary | ICD-10-CM | POA: Insufficient documentation

## 2023-11-19 NOTE — Progress Notes (Unsigned)
 Cardiology Office Note:    Date:  11/20/2023   ID:  [name] [name], DOB 05/01/1940, MRN [id number]  PCP:  [name] Garnette BIRCH., MD  Cardiologist:  [name] Leiter, MD    Referring MD: [name] Garnette BIRCH., MD    ASSESSMENT:    1. Coronary artery disease involving native coronary artery of native heart with unstable angina pectoris (HCC)   2. Essential hypertension   3. Pure hypercholesterolemia   4. Statin intolerance    PLAN:    In order of problems listed above:  [name] [name] with CAD he had a very complex course with multiple PCI's and we have chosen to remain on dual antiplatelet therapy lipid-lowering through the [facility] and continue his beta-blocker Controlled continue his current medical treatment beta-blocker Lipids followed at the [facility]   Next appointment: I will plan to see him in 1 year   Medication Adjustments/Labs and Tests Ordered: Current medicines are reviewed at length with the patient today.  Concerns regarding medicines are outlined above.  Orders Placed This Encounter  Procedures   EKG 12-Lead   No orders of the defined types were placed in this encounter.    History of Present Illness:    [name] [name] is a 83 y.o. male with a hx of CAD with PCI and stent left circumflex coronary artery March 2009 subsequent PCI and stent mid left circumflex and first marginal branch February 2019 hypertension symptomatic orthostatic hypotension and hyperlipidemia last seen 11/19/2022.  Compliance with diet, lifestyle and medications: Yes  [name] [name] has done quite [name] getting the majority of his care through the [place] he says he is on another lipid-lowering medication?  Bempedoic acid no longer on Repatha . Most recent labs in July cholesterol 162 LDL 102 hemoglobin 15.1 creatinine 0.12 potassium 4.0 He has had no cardiovascular symptoms edema shortness breath chest pain palpitation or syncope He has had no muscle pain or weakness with his current  lipid-lowering treatment Remains on long-term dual antiplatelet therapy without GI side effect or bleeding Past Medical History:  Diagnosis Date   Acute myocardial infarction, subendocardial infarction [facility])    IMO SNOMED Dx Update Oct 2024     Arthritis    left pinky (03/04/2017)   Cerebral vascular malformation    PT'S NEUROLOGIST IS DR. 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 s/p stent to Cx 2009. b. recent NSTEMI s/p DES to mLCx and m/dRCA.   Coronary artery disease involving native coronary artery of native heart with unstable angina pectoris (HCC)    Encounter for fitting and adjustment of hearing aid 11/19/2023   Essential hypertension 03/06/2017   Gallstones 08/07/2012   GERD (gastroesophageal reflux disease)    History of MI (myocardial infarction)    Hyperlipidemia    Hypertension    Mass of right kidney    FOUND ON WORK UP FOR ABD PAIN   Murmur    Myocardial infarction (HCC) 04/14/2007   NSTEMI (non-ST elevated myocardial infarction) (HCC) 03/04/2017   thelbert 03/04/2017   Osteoarthritis 11/19/2023   Nov 09, 2010 Entered By: ELLEN SAKAI P Comment: s/p Lt TKR 5/12-pmd     Prostate cancer (HCC)    PVC's (premature ventricular contractions)    PVCs (premature ventricular contractions) 03/15/2017   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   aspirin  81 MG tablet Take 1 tablet (81 mg total) by mouth daily.   clopidogrel  (PLAVIX ) 75 MG tablet Take 75 mg by mouth daily.   Evolocumab  140 MG/ML SOSY Inject 140 mg into the skin every 14 (fourteen) days.   metoprolol  tartrate (LOPRESSOR ) 25 MG tablet Take 1 tablet (25 mg total) by mouth 2 (two) times  daily.   nitroGLYCERIN  (NITROSTAT ) 0.4 MG SL tablet Place 1 tablet (0.4 mg total) under the tongue every 5 (five) minutes as needed for chest pain.   oxyCODONE  (OXY IR/ROXICODONE ) 5 MG immediate release tablet Take 1-2 tablets (5-10 mg total) by mouth every 6 (six) hours as needed for moderate pain (pain score 4-6).   pantoprazole  (PROTONIX ) 40 MG tablet TAKE 1 TABLET BY MOUTH DAILY AT 6:00 AM (Patient taking differently: Take 40 mg by mouth daily.)      EKGs/Labs/Other Studies Reviewed:    The following studies were reviewed today:  Cardiac Studies & Procedures   ______________________________________________________________________________________________ CARDIAC CATHETERIZATION  CARDIAC CATHETERIZATION 03/25/2017  Conclusion Images from the original result were not included.   Previously placed Mid RCA stent (unknown type) is widely patent.  Previously placed Prox Cx to Mid Cx stent (unknown type) is widely patent.  Prox Cx lesion is 50% stenosed.  Prox LAD lesion is 30% stenosed.  The left ventricular systolic function is normal.  LV end diastolic pressure is normal.  The left ventricular ejection fraction is 55-65% by visual estimate.  [name] [name] is a 83 y.o. male   [id number] LOCATION:  FACILITY: [facility] PHYSICIAN: Dorn Lesches, M.D. 1940/12/20   DATE OF PROCEDURE:  03/25/2017  DATE OF DISCHARGE:     CARDIAC CATHETERIZATION    History obtained from chart review.83 y.o. male with history of CAD (s/p stent to Cx 2009, recent NSTEMI s/p DES to mLCx and m/dRCA), HTN, HLD (previous refusal of statins until recently), prostate CA, PVCs, remote tobacco abuse who presented back to the hospital with recurrent CP. Transferred from [place] to [place] for further work up.  Impression [name] had  patent stents in the circumflex and RCA. There is a 50% stenosis in the mid AV groove circumflex just proximal to the stented segment but this  did not appear to be  hemodynamically significant. The LAD was 40% segmentally  Stenosed which was  unchanged from the prior caths. LV Function was normal. I could not identify a culprit lesion. Medical therapy recommended. The patient removed and pressure held on the groin to achieve hemostasis. The patient left the lab in stable condition. He can be discharged home tomorrow. Follow-up with Dr. Monetta.  Dorn Lesches. MD, [facility] 03/25/2017 3:48 PM  Findings Coronary Findings Diagnostic  Dominance: Right  Left Anterior Descending Prox LAD lesion is 30% stenosed.  Left Circumflex Prox Cx lesion is 50% stenosed. Previously placed Prox Cx to Mid Cx stent (unknown type) is widely patent.  Right Coronary Artery Previously placed Mid RCA stent (unknown type) is widely patent.  Intervention  No interventions have been documented.   CARDIAC CATHETERIZATION  CARDIAC CATHETERIZATION 03/05/2017  Conclusion  Mid LM lesion is 25% stenosed.  Prox LAD to Mid LAD lesion is 55% stenosed.  Ost 1st Mrg lesion is 40% stenosed.  1st Mrg lesion is 95% stenosed.  A drug-eluting stent was successfully placed using a STENT SYNERGY  DES 3.5X16.  Post intervention, there is a 0% residual stenosis.  Mid RCA to Dist RCA lesion is 75% stenosed.  A drug-eluting stent was successfully placed using a STENT SYNERGY DES 2.75X12.  Post intervention, there is a 0% residual stenosis.  Prox RCA to Mid RCA lesion is 30% stenosed.  1. Successful stenting of the mid LCx with DES 2. Successful stenting of the mid to distal RCA with DES.  Plan: DAPT for at least one year. Anticipate DC in am.  Findings Coronary Findings Diagnostic  Dominance: Right  Left Main Mid LM lesion is 25% stenosed.  Left Anterior Descending Prox LAD to Mid LAD lesion is 55% stenosed. The lesion is severely calcified.  Left Circumflex  First Obtuse Marginal Branch Ost 1st Mrg lesion is 40% stenosed. The lesion was previously treated. 1st Mrg  lesion is 95% stenosed.  Right Coronary Artery Prox RCA to Mid RCA lesion is 30% stenosed. Mid RCA to Dist RCA lesion is 75% stenosed.  Intervention  1st Mrg lesion Stent Lesion crossed with guidewire using a WIRE ASAHI PROWATER 180CM. Pre-stent angioplasty was performed using a BALLOON EMERGE MR 2.5X12. A drug-eluting stent was successfully placed using a STENT SYNERGY DES 3.5X16. Stent strut is [name] apposed. Stent overlaps previously placed stent. Post-stent angioplasty was performed using a BALLOON SAPPHIRE [place] [id number]. Maximum pressure:  16 atm. Due to tortuosity in the proximal vessel a Guideliner support catheter was advanced over the balloon into the mid LCx. Post-Intervention Lesion Assessment The intervention was successful. Pre-interventional TIMI flow is 3. Post-intervention TIMI flow is 3. No complications occurred at this lesion. There is a 0% residual stenosis post intervention.  Mid RCA to Dist RCA lesion Stent Lesion crossed with guidewire using a WIRE ASAHI PROWATER 180CM. Pre-stent angioplasty was performed using a BALLOON EMERGE MR 2.5X12. A drug-eluting stent was successfully placed using a STENT SYNERGY DES 2.75X12. Stent strut is [name] apposed. Post-stent angioplasty was performed using a BALLOON SAPPHIRE  3.0X8. Maximum pressure:  16 atm. Post-Intervention Lesion Assessment The intervention was successful. Pre-interventional TIMI flow is 3. Post-intervention TIMI flow is 3. No complications occurred at this lesion. There is a 0% residual stenosis post intervention.     ECHOCARDIOGRAM  ECHOCARDIOGRAM COMPLETE 03/06/2017  Narrative ** *[facility]* [address] [place], [place] [address] [phone]  ------------------------------------------------------------------- Transthoracic Echocardiography  Patient:    [name] [name], [name] [name] MR #:       [id number] Study Date: 03/06/2017 Gender:     M Age:        76 Height:     190.5  cm Weight:     96.5 kg BSA:        2.27 m^2 Pt. Status: Room:       6C04C  ORDERING     Peter Jordan, M.D. REFERRING    Peter Jordan, M.D. ADMITTING    Dorn Lesches, MD ATTENDING    Dorn Lesches, MD PERFORMING   Chmg, Inpatient SONOGRAPHER  Fairy Canton, RDCS  cc:  ------------------------------------------------------------------- LV EF: 50% -   55%  ------------------------------------------------------------------- History:   PMH:  chest pain  Coronary artery disease.  PMH:   Myocardial infarction.  Risk factors:  Hypertension. Diabetes mellitus. Dyslipidemia.  ------------------------------------------------------------------- Study Conclusions  - Left ventricle: The cavity size was normal. Wall thickness was increased in a pattern of mild LVH. Systolic function was normal. The estimated ejection fraction was in the range of 50% to 55%. Wall motion was normal; there were no regional  wall motion abnormalities. Doppler parameters are consistent with abnormal left ventricular relaxation (grade 1 diastolic dysfunction). - Aortic valve: There was trivial regurgitation. - Right atrium: The atrium was moderately dilated. - Tricuspid valve: There was mild-moderate regurgitation. - Pulmonary arteries: Systolic pressure was moderately increased. PA peak pressure: 40 mm Hg (S).  ------------------------------------------------------------------- Study data:  No prior study was available for comparison.  Study status:  Routine.  Procedure:  Transthoracic echocardiography. Image quality was adequate.  Study completion:  There were no complications.          Transthoracic echocardiography.  M-mode, complete 2D, spectral Doppler, and color Doppler.  Birthdate: Patient birthdate: [birth year].  Age:  Patient is 83 yr old.  Sex: Gender: male.    BMI: 26.6 kg/m^2.  Blood pressure:     142/72 Patient status:  Inpatient.  Study date:  Study date: 03/06/2017. Study time: 07:53 AM.   Location:  Bedside.  -------------------------------------------------------------------  ------------------------------------------------------------------- Left ventricle:  The cavity size was normal. Wall thickness was increased in a pattern of mild LVH. Systolic function was normal. The estimated ejection fraction was in the range of 50% to 55%. Wall motion was normal; there were no regional wall motion abnormalities. Doppler parameters are consistent with abnormal left ventricular relaxation (grade 1 diastolic dysfunction).  ------------------------------------------------------------------- Aortic valve:   Structurally normal valve.   Cusp separation was normal.  Doppler:  Transvalvular velocity was within the normal range. There was no stenosis. There was trivial regurgitation.  ------------------------------------------------------------------- Aorta:  Aortic root: The aortic root was normal in size. Ascending aorta: The ascending aorta was normal in size.  ------------------------------------------------------------------- Mitral valve:   Moderately thickened, moderately calcified leaflets .  Doppler:  There was no significant regurgitation.  ------------------------------------------------------------------- Left atrium:  The atrium was normal in size.  ------------------------------------------------------------------- Right ventricle:  The cavity size was normal. Systolic function was normal.  ------------------------------------------------------------------- Pulmonic valve:    The valve appears to be grossly normal. Doppler:  There was no significant regurgitation.  ------------------------------------------------------------------- Tricuspid valve:   The valve appears to be grossly normal. Doppler:  There was mild-moderate regurgitation.  ------------------------------------------------------------------- Pulmonary artery:   Systolic pressure was moderately  increased.  ------------------------------------------------------------------- Right atrium:  The atrium was moderately dilated.  ------------------------------------------------------------------- Pericardium:  There was no pericardial effusion.  ------------------------------------------------------------------- Measurements  Left ventricle                          Value        Reference LV ID, ED, PLAX chordal         (L)     42.2  mm     43 - 52 LV ID, ES, PLAX chordal                 24.9  mm     23 - 38 LV fx shortening, PLAX chordal          41    %      >=29 LV PW thickness, ED                     10.6  mm     ---------- IVS/LV PW ratio, ED                     1.25         <=1.3 Stroke volume, 2D  82    ml     ---------- Stroke volume/bsa, 2D                   36    ml/m^2 ----------  Ventricular septum                      Value        Reference IVS thickness, ED                       13.3  mm     ----------  LVOT                                    Value        Reference LVOT ID, S                              23    mm     ---------- LVOT area                               4.15  cm^2   ---------- LVOT peak velocity, S                   89.2  cm/s   ---------- LVOT mean velocity, S                   61.3  cm/s   ---------- LVOT VTI, S                             19.8  cm     ----------  Aorta                                   Value        Reference Aortic root ID, ED                      30    mm     ----------  Left atrium                             Value        Reference LA ID, A-P, ES                          45    mm     ---------- LA ID/bsa, A-P                          1.98  cm/m^2 <=2.2 LA volume, S                            70.9  ml     ---------- LA volume/bsa, S                        31.2  ml/m^2 ---------- LA volume, ES, 1-p A4C  69.9  ml     ---------- LA volume/bsa, ES, 1-p A4C              30.8  ml/m^2  ---------- LA volume, ES, 1-p A2C                  60.8  ml     ---------- LA volume/bsa, ES, 1-p A2C              26.8  ml/m^2 ----------  Pulmonary arteries                      Value        Reference PA pressure, S, DP              (H)     40    mm Hg  <=30  Tricuspid valve                         Value        Reference Tricuspid regurg peak velocity          303   cm/s   ---------- Tricuspid peak RV-RA gradient           37    mm Hg  ----------  Right atrium                            Value        Reference RA ID, S-I, ES, A4C             (H)     66.6  mm     34 - 49 RA area, ES, A4C                (H)     23.7  cm^2   8.3 - 19.5 RA volume, ES, A/L                      67.6  ml     ---------- RA volume/bsa, ES, A/L                  29.8  ml/m^2 ----------  Right ventricle                         Value        Reference RV ID, minor axis, ED, A4C base         37.2  mm     ---------- TAPSE                                   36    mm     ---------- RV s&', lateral, S                       14.8  cm/s   ----------  Legend: (L)  and  (H)  mark values outside specified reference range.  ------------------------------------------------------------------- Prepared and Electronically Authenticated by  Aleene Passe, M.D. 2019-02-13T13:24:53          ______________________________________________________________________________________________      EKG Interpretation Date/Time:  Wednesday November 20 2023 10:23:19 EDT Ventricular Rate:  57 PR Interval:  204 QRS Duration:  96 QT Interval:  438 QTC Calculation: 426 R Axis:   -2  Text Interpretation: Sinus  bradycardia Otherwise normal ECG When compared with ECG of 19-Nov-2022 12:58, T wave inversion no longer evident in Inferior leads Confirmed by Monetta Rogue ([id number]) on 11/20/2023 10:28:19 AM   Recent Labs: No results found for requested labs within last 365 days.  Recent Lipid Panel    Component Value Date/Time   CHOL 192  12/14/2020 1127   TRIG 96 12/14/2020 1127   HDL 46 12/14/2020 1127   CHOLHDL 4.2 12/14/2020 1127   CHOLHDL 4.3 03/05/2017 0350   VLDL 24 03/05/2017 0350   LDLCALC 129 (H) 12/14/2020 1127    Physical Exam:    VS:  BP 118/80   Pulse (!) 57   Ht 6' 3 (1.905 m)   Wt 207 lb 9.6 oz (94.2 kg)   SpO2 96%   BMI 25.95 kg/m     Wt Readings from Last 3 Encounters:  11/20/23 207 lb 9.6 oz (94.2 kg)  11/19/22 204 lb (92.5 kg)  03/26/22 208 lb (94.3 kg)     GEN:  [name] nourished, [name] developed in no acute distress HEENT: Normal NECK: No JVD; No carotid bruits LYMPHATICS: No lymphadenopathy CARDIAC: RRR, no murmurs, rubs, gallops RESPIRATORY:  Clear to auscultation without rales, wheezing or rhonchi  ABDOMEN: Soft, non-tender, non-distended MUSCULOSKELETAL:  No edema; No deformity  SKIN: Warm and dry NEUROLOGIC:  Alert and oriented x 3 PSYCHIATRIC:  Normal affect    Signed, Rogue Monetta, MD  11/20/2023 10:42 AM    [facility]

## 2023-11-20 ENCOUNTER — Encounter: Payer: Self-pay | Admitting: Cardiology

## 2023-11-20 ENCOUNTER — Ambulatory Visit: Attending: Cardiology | Admitting: Cardiology

## 2023-11-20 VITALS — BP 118/80 | HR 57 | Ht 75.0 in | Wt 207.6 lb

## 2023-11-20 DIAGNOSIS — Z789 Other specified health status: Secondary | ICD-10-CM | POA: Diagnosis not present

## 2023-11-20 DIAGNOSIS — I10 Essential (primary) hypertension: Secondary | ICD-10-CM | POA: Diagnosis not present

## 2023-11-20 DIAGNOSIS — E7800 Pure hypercholesterolemia, unspecified: Secondary | ICD-10-CM | POA: Diagnosis not present

## 2023-11-20 DIAGNOSIS — I25110 Atherosclerotic heart disease of native coronary artery with unstable angina pectoris: Secondary | ICD-10-CM

## 2023-11-20 NOTE — Patient Instructions (Signed)
